# Patient Record
Sex: Male | Born: 1965
Health system: Southern US, Community
[De-identification: ages and names within clinical notes are randomized; demographics above are authoritative.]

## PROBLEM LIST (undated history)

## (undated) DIAGNOSIS — Z9889 Other specified postprocedural states: Secondary | ICD-10-CM

## (undated) DIAGNOSIS — K219 Gastro-esophageal reflux disease without esophagitis: Secondary | ICD-10-CM

## (undated) DIAGNOSIS — I10 Essential (primary) hypertension: Secondary | ICD-10-CM

## (undated) DIAGNOSIS — R112 Nausea with vomiting, unspecified: Secondary | ICD-10-CM

## (undated) DIAGNOSIS — T8859XA Other complications of anesthesia, initial encounter: Secondary | ICD-10-CM

## (undated) DIAGNOSIS — T4145XA Adverse effect of unspecified anesthetic, initial encounter: Secondary | ICD-10-CM

## (undated) DIAGNOSIS — E119 Type 2 diabetes mellitus without complications: Secondary | ICD-10-CM

## (undated) DIAGNOSIS — N189 Chronic kidney disease, unspecified: Secondary | ICD-10-CM

## (undated) DIAGNOSIS — M1990 Unspecified osteoarthritis, unspecified site: Secondary | ICD-10-CM

## (undated) HISTORY — PX: LITHOTRIPSY: SUR834

## (undated) HISTORY — DX: Essential (primary) hypertension: I10

## (undated) HISTORY — DX: Type 2 diabetes mellitus without complications: E11.9

## (undated) HISTORY — PX: KIDNEY STONE SURGERY: SHX686

---

## 2005-03-09 ENCOUNTER — Ambulatory Visit: Payer: Self-pay | Admitting: Specialist

## 2005-05-15 ENCOUNTER — Ambulatory Visit: Payer: Self-pay | Admitting: Physician Assistant

## 2005-06-21 ENCOUNTER — Ambulatory Visit: Payer: Self-pay | Admitting: Unknown Physician Specialty

## 2006-01-18 ENCOUNTER — Ambulatory Visit: Payer: Self-pay | Admitting: Specialist

## 2007-01-19 ENCOUNTER — Ambulatory Visit: Payer: Self-pay | Admitting: Internal Medicine

## 2007-02-06 ENCOUNTER — Ambulatory Visit: Payer: Self-pay | Admitting: Specialist

## 2007-02-24 ENCOUNTER — Ambulatory Visit: Payer: Self-pay | Admitting: Internal Medicine

## 2007-02-25 ENCOUNTER — Ambulatory Visit: Payer: Self-pay | Admitting: Specialist

## 2007-02-28 ENCOUNTER — Ambulatory Visit: Payer: Self-pay | Admitting: Specialist

## 2007-06-08 ENCOUNTER — Ambulatory Visit: Payer: Self-pay | Admitting: Family Medicine

## 2007-06-13 ENCOUNTER — Inpatient Hospital Stay: Payer: Self-pay | Admitting: Urology

## 2007-06-20 ENCOUNTER — Ambulatory Visit: Payer: Self-pay | Admitting: Urology

## 2007-07-07 ENCOUNTER — Emergency Department: Payer: Self-pay | Admitting: Emergency Medicine

## 2007-07-09 ENCOUNTER — Ambulatory Visit: Payer: Self-pay | Admitting: Specialist

## 2007-11-12 ENCOUNTER — Emergency Department: Payer: Self-pay | Admitting: Emergency Medicine

## 2007-11-12 ENCOUNTER — Other Ambulatory Visit: Payer: Self-pay

## 2009-01-04 IMAGING — CR DG HAND COMPLETE 3+V*L*
1 series · 3 of 3 positions shown · non-contrast
Comparison: none

REASON FOR EXAM: laceration
COMMENTS:

PROCEDURE:     MDR - MDR HAND LT COMP W/OBLIQUES  - January 19, 2007 [DATE]
RESULT:     Comparison: No available comparison exam.

[Series 1: view not recorded · 0.17mm/px · 3 of 3 slices shown]
[im 1/3]
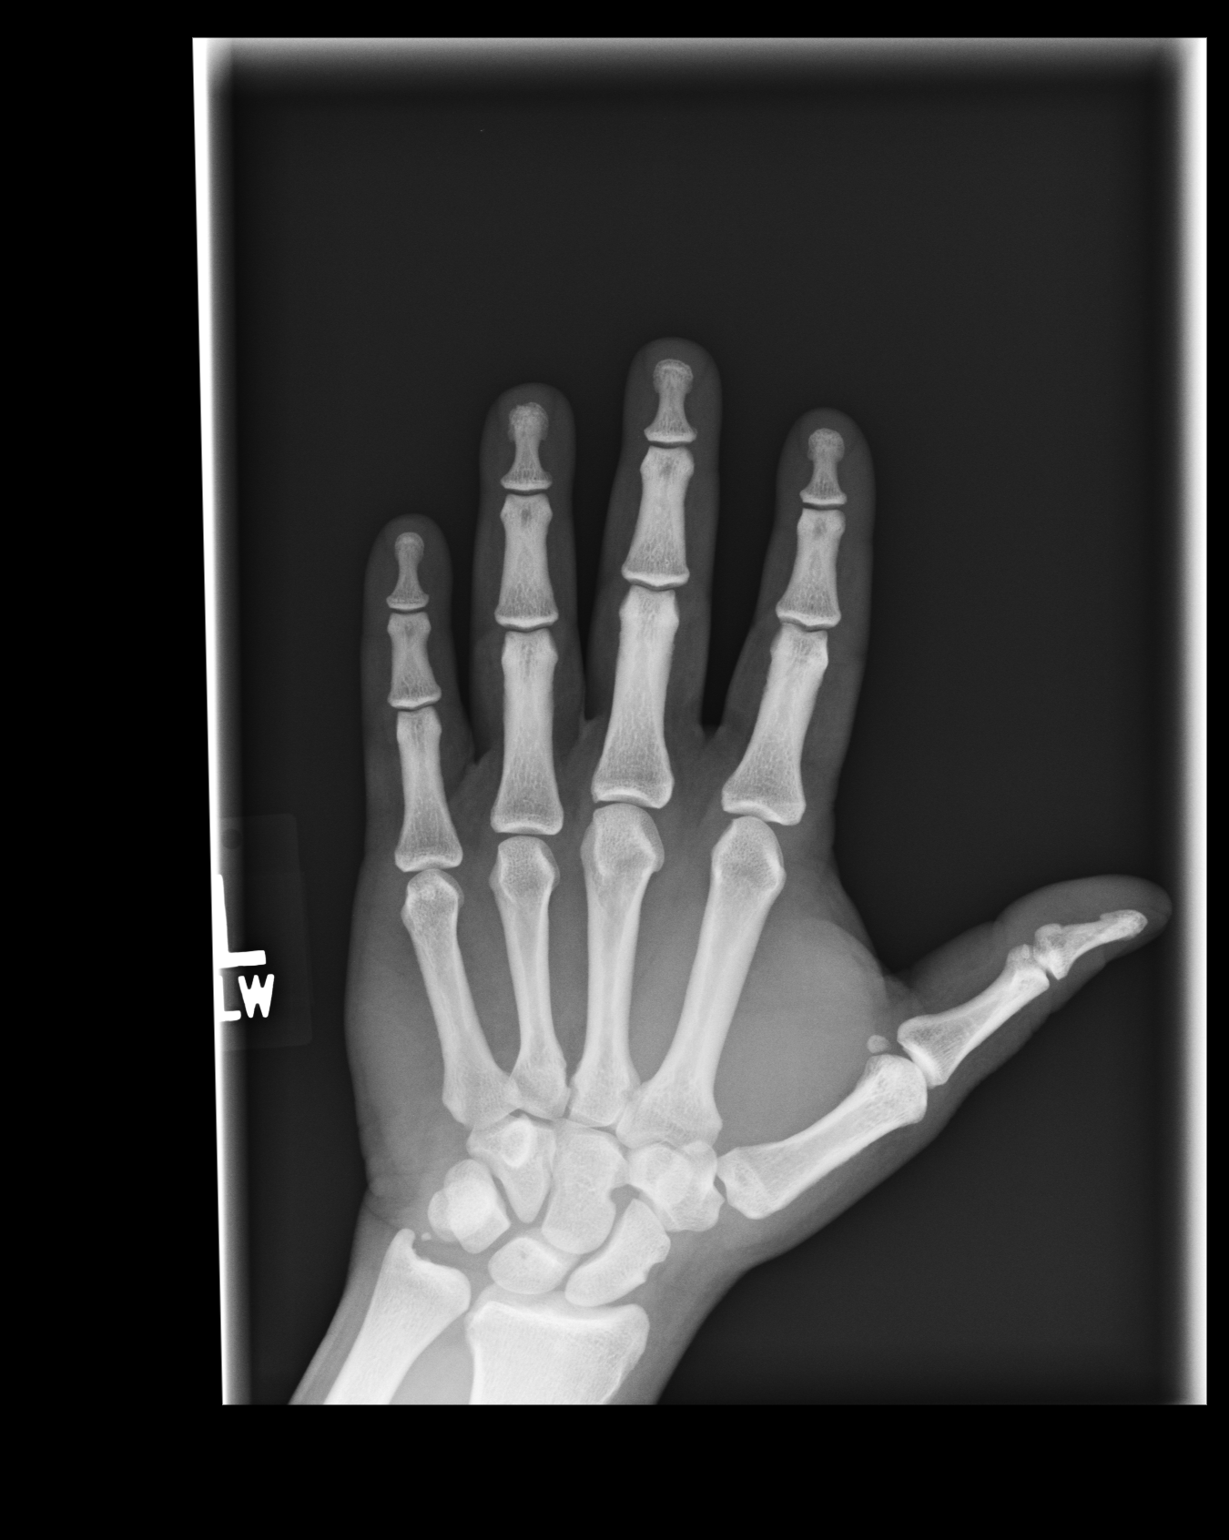
[im 2/3]
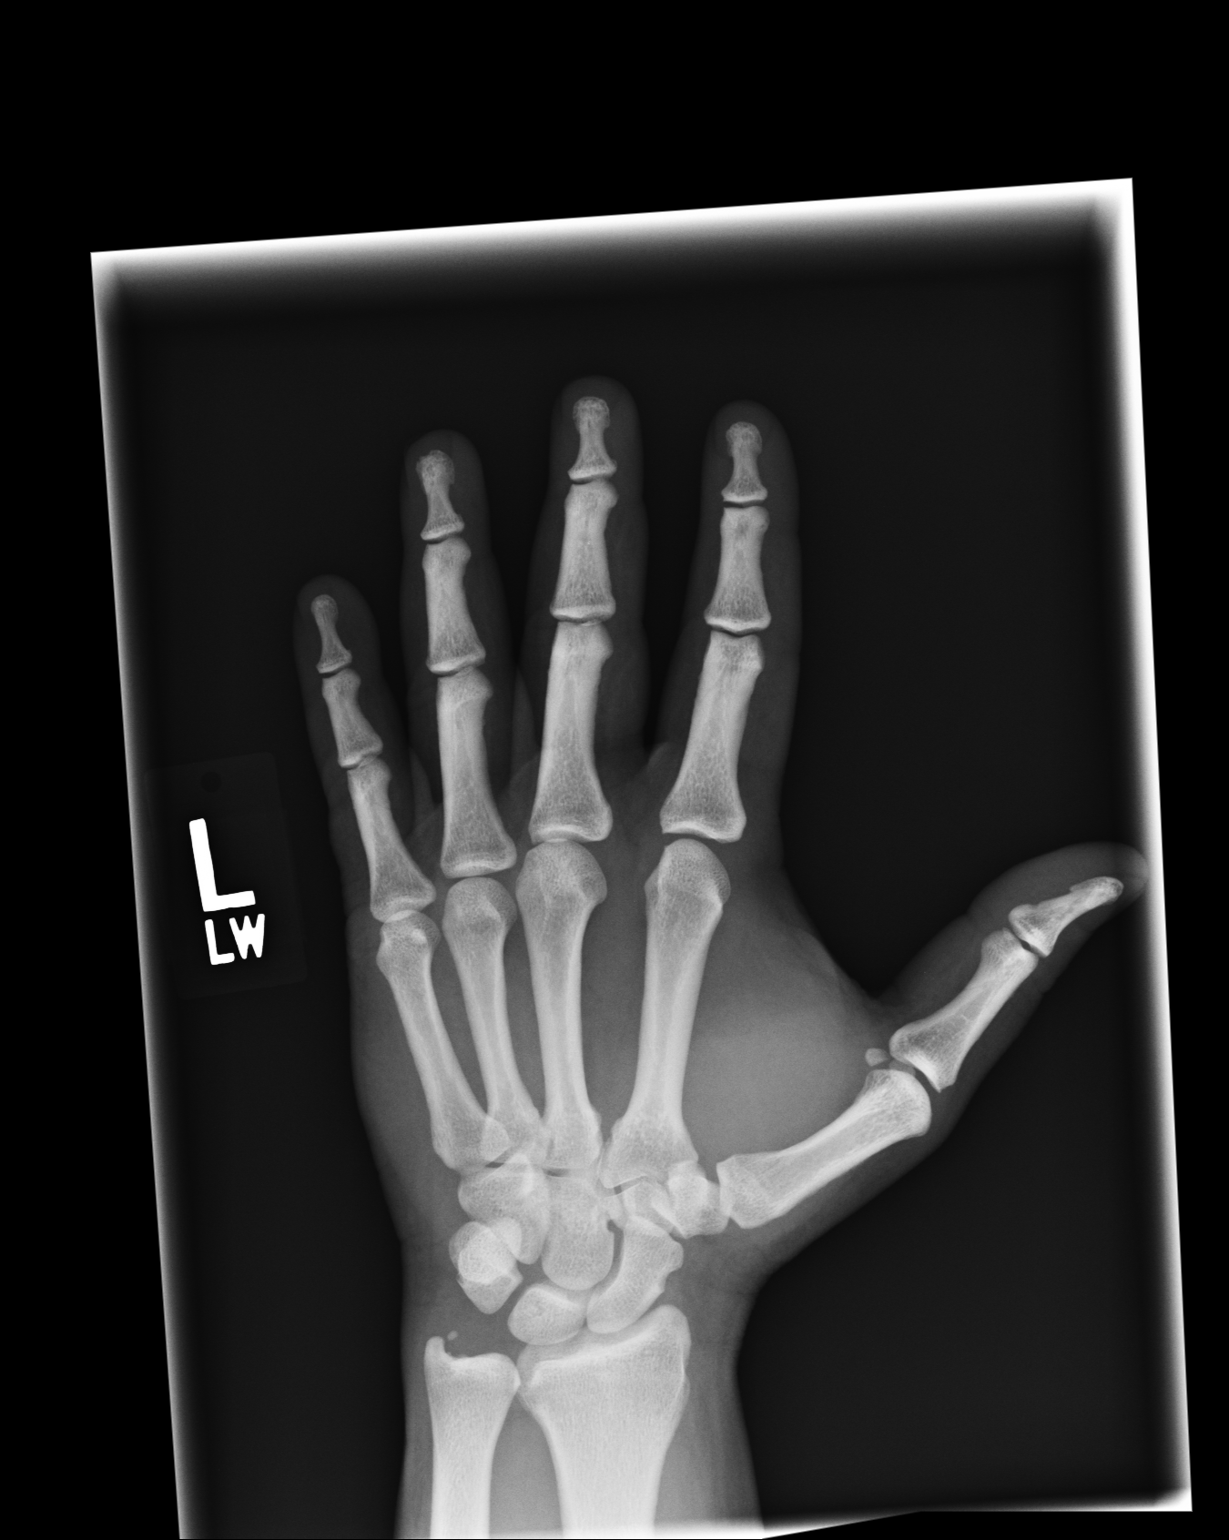
[im 3/3]
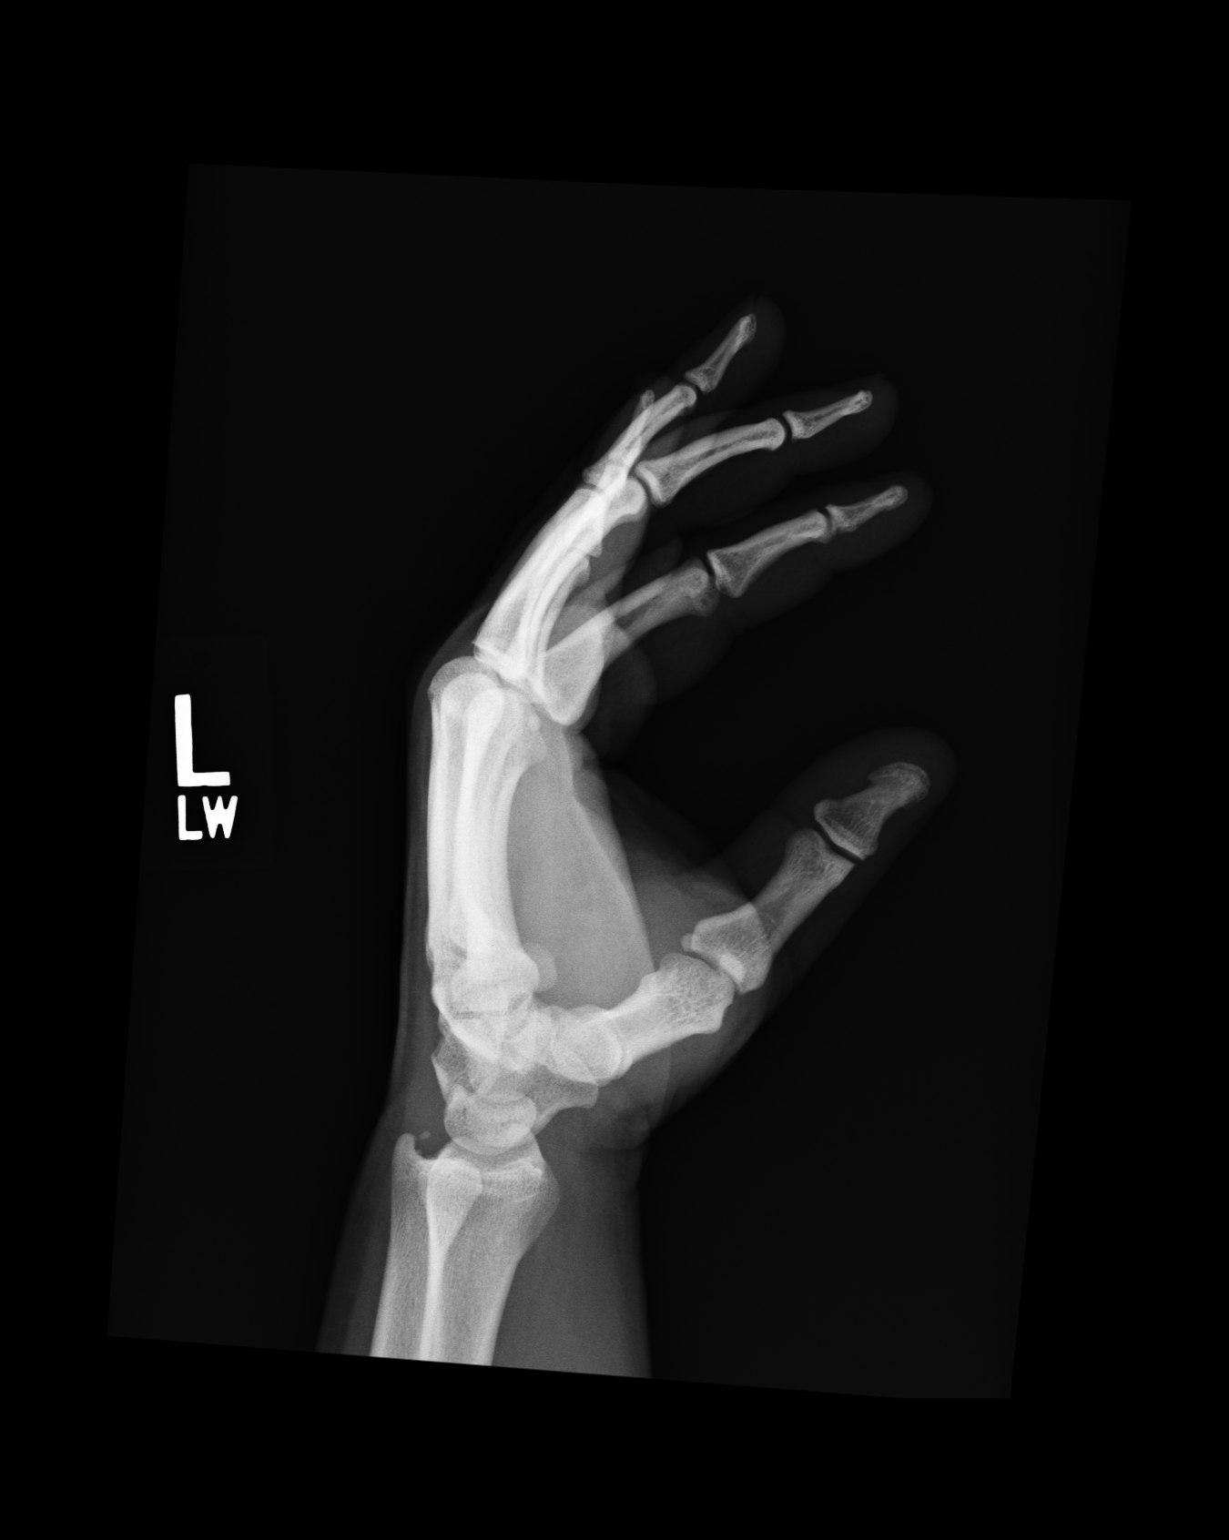

[3 of 3 positions shown; findings below may reference images not displayed]

FINDINGS: Three views of the left hand were obtained.

Old mildly displaced left ulnar styloid fracture is noted. No evidence of
acute fracture or dislocation of the left hand. No significant joint
abnormality is seen. No radiopaque foreign body is seen.
IMPRESSION: 1. Please see findings.

## 2009-05-29 IMAGING — CT CT ABD-PELV W/O CM
1 of 2 series · 15 of 32 positions shown, 19 images · non-contrast
Comparison: none

REASON FOR EXAM: (1) llq pain w/ hx renal colic; (2) llq pain w/ hx renal
colic
COMMENTS:

[Series 2: stone · axial · 0.75mm/px · z∈[+193,+622]mm · 15 of 161 slices shown, 19 images]
[im 12/161  soft-tissue]
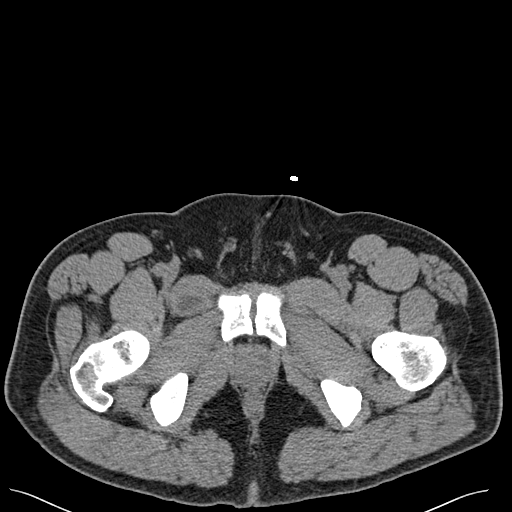
[im 12/161  bone]
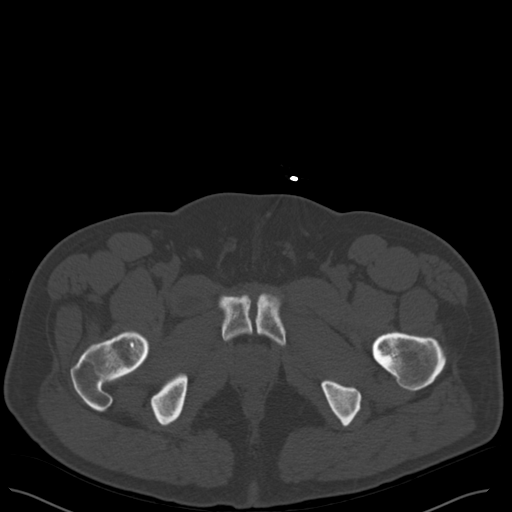
[im 23/161  soft-tissue]
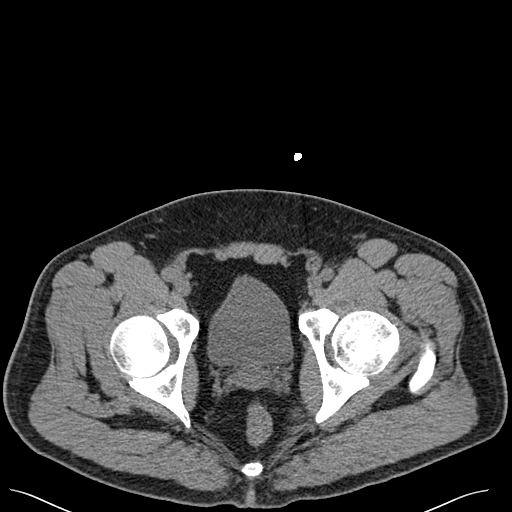
[im 35/161  soft-tissue]
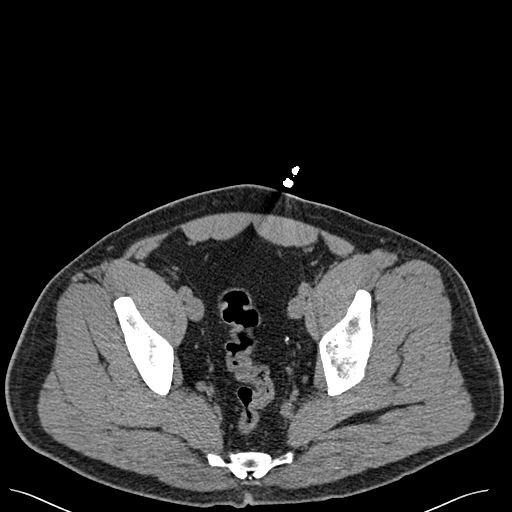
[im 46/161  soft-tissue]
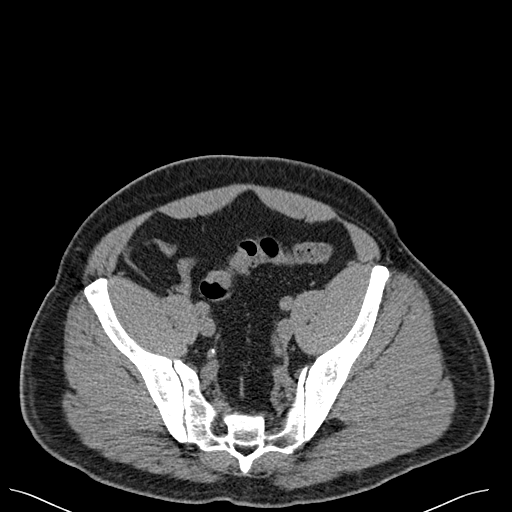
[im 58/161  soft-tissue]
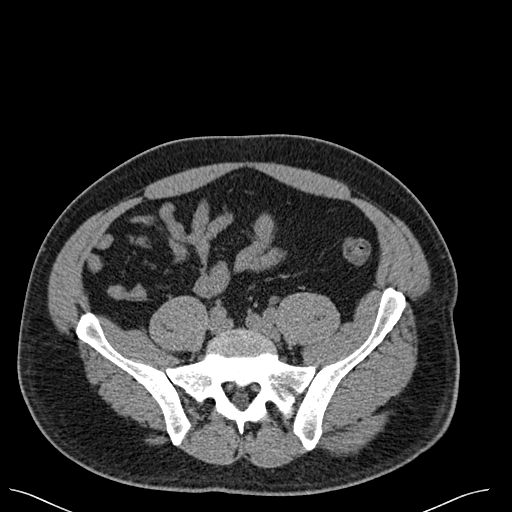
[im 69/161  soft-tissue]
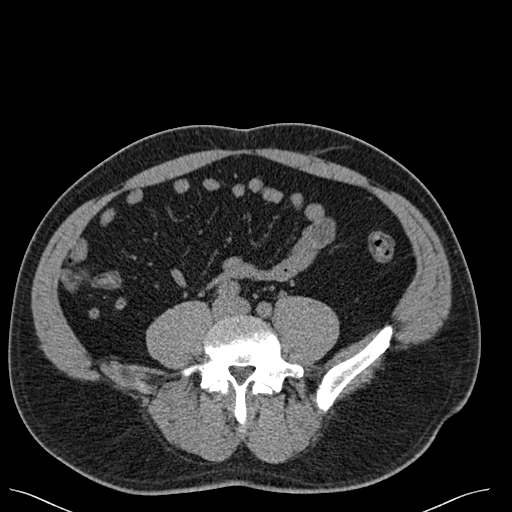
[im 81/161  soft-tissue]
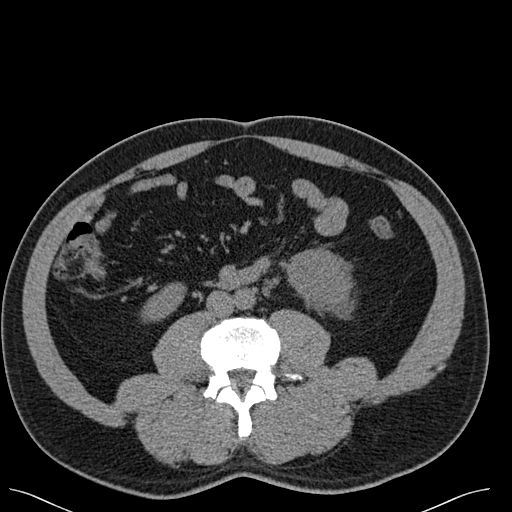
[im 92/161  soft-tissue]
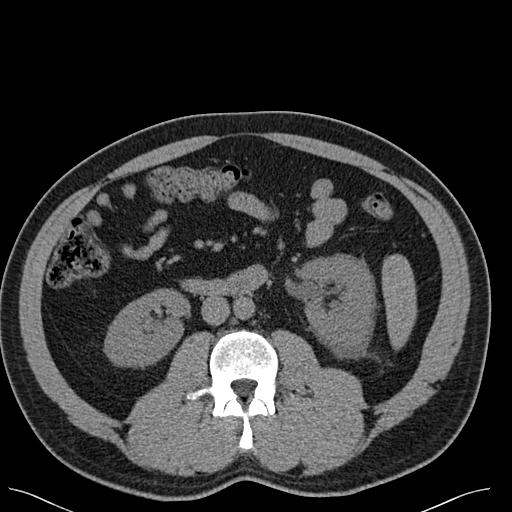
[im 103/161  soft-tissue]
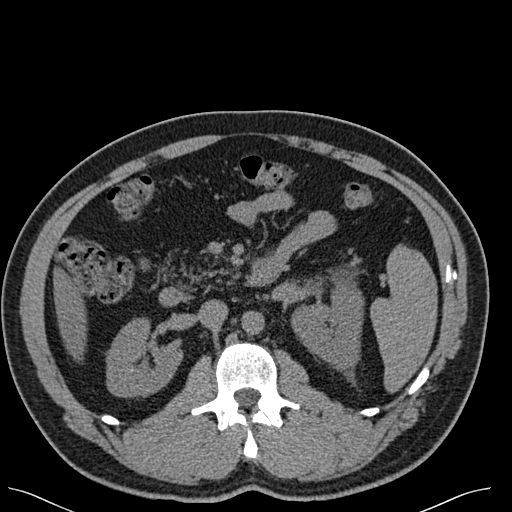
[im 103/161  bone]
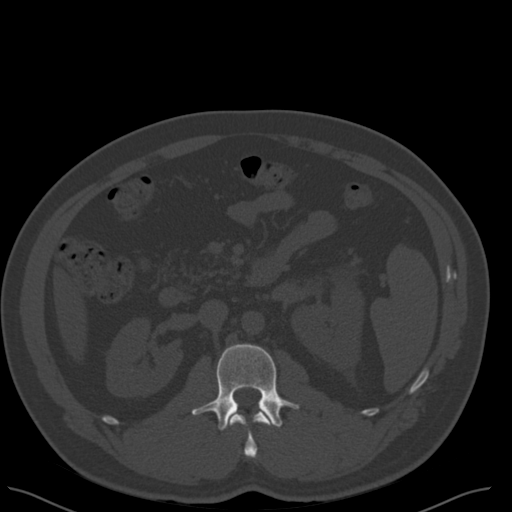
[im 115/161  soft-tissue]
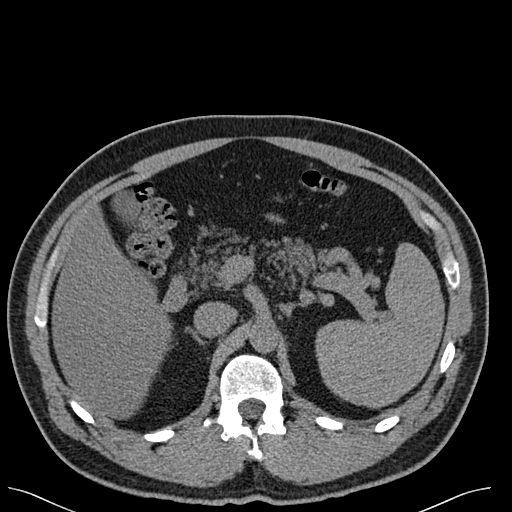
[im 126/161  soft-tissue]
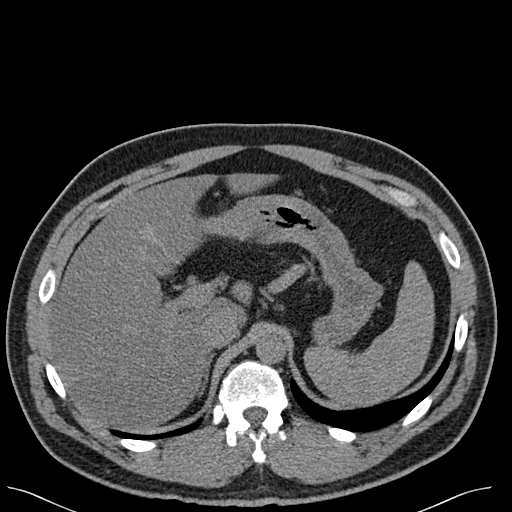
[im 138/161  soft-tissue]
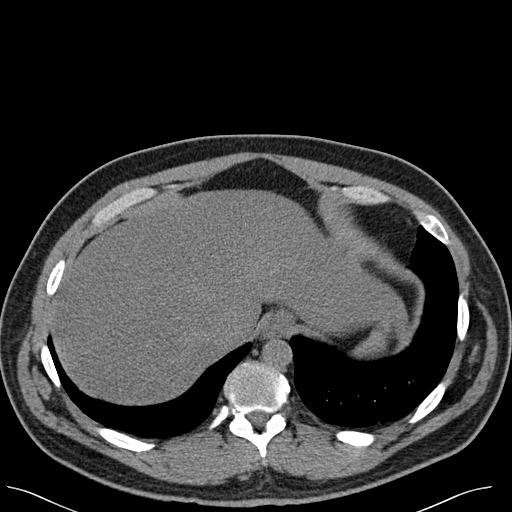
[im 138/161  lung]
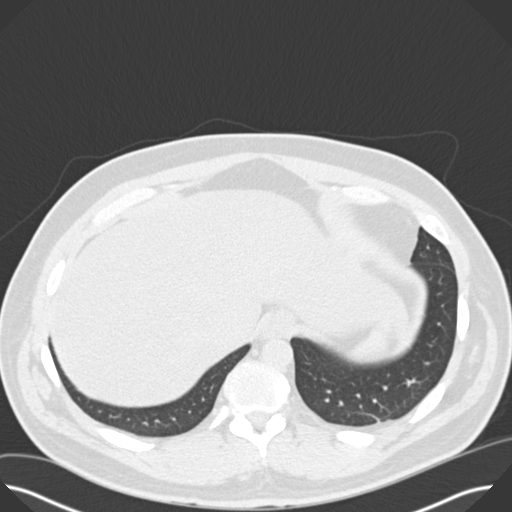
[im 143/161  lung]
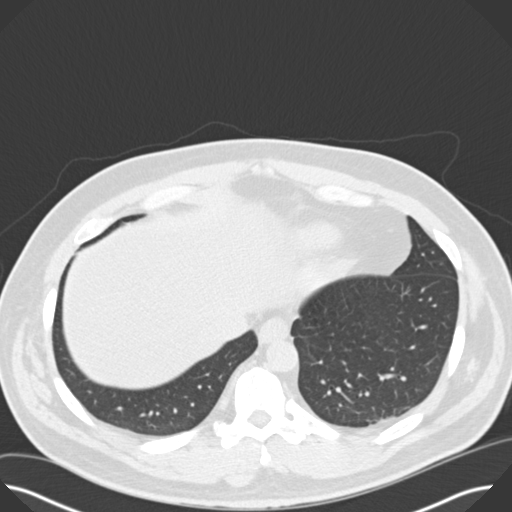
[im 149/161  soft-tissue]
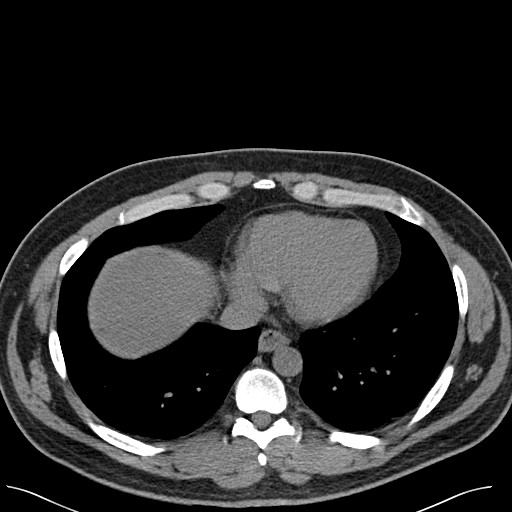
[im 149/161  lung]
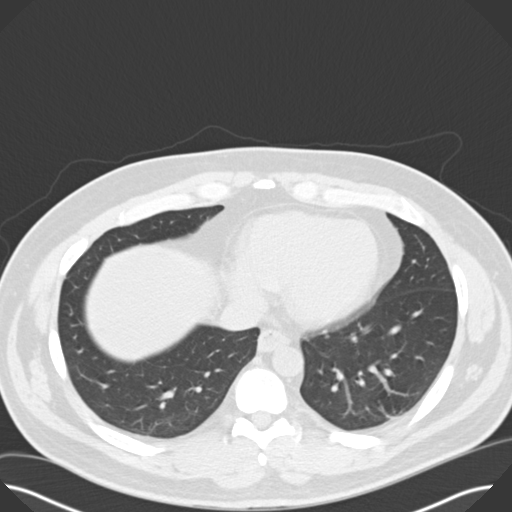
[im 155/161  lung]
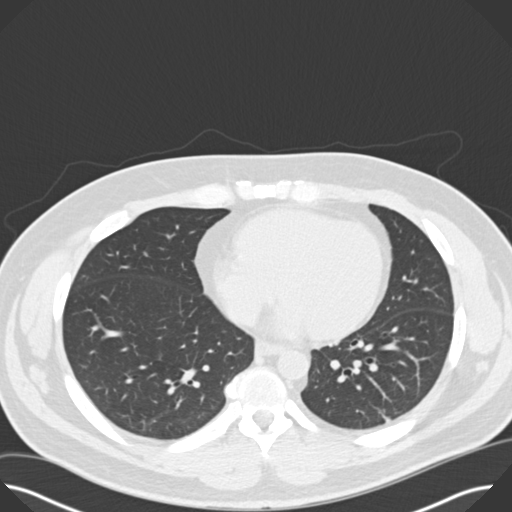

[15 of 32 positions shown; findings below may reference images not displayed]

PROCEDURE:     CT  - CT ABDOMEN AND PELVIS W[DATE]  [DATE]

RESULT:     Comparison is made with prior study dated 02/25/2007.

Helical, noncontrast images in 3 mm sections were obtained from the lung
bases through the pubic symphysis.

Evaluation of the lung bases demonstrate no gross abnormalities.  Within the
limitations of a noncontrast CT  the liver, spleen, pancreas, adrenals, and
RIGHT kidney are unremarkable.

Evaluation of the LEFT kidney demonstrates edema within the renal
parenchyma. There is also stranding in the mesenteric fat. Mild to slightly
moderate dilatation of the renal pelvis is identified and there is mild
hydronephrosis. Mild to slightly moderate hydroureter is identified and
within the distal left ureter a 5.3 mm calculus is identified. This appears
to be approximately 3-5 centimeters proximal to the urinary bladder. There
is no evidence of abdominal or pelvic masses adenopathy no drainable
loculated fluid collections.
IMPRESSION: 1.     Distal left ureteral calculus with associated mild to slightly
moderate obstructive uropathy. Note:  There is stranding within the
perinephric fat which may represent previous decompression of the right
urinary collecting system.
2.     Dr. Giorgi of the emergency department was informed of these
findings at the time of the initial interpretation.  This calculus measures
5.3 mm in diameter.

## 2009-08-17 ENCOUNTER — Ambulatory Visit: Payer: Self-pay | Admitting: General Practice

## 2009-10-13 ENCOUNTER — Emergency Department: Payer: Self-pay | Admitting: Emergency Medicine

## 2010-09-12 ENCOUNTER — Ambulatory Visit: Payer: Self-pay | Admitting: Internal Medicine

## 2011-05-06 ENCOUNTER — Ambulatory Visit: Payer: Self-pay | Admitting: Internal Medicine

## 2011-09-10 ENCOUNTER — Ambulatory Visit: Payer: Self-pay | Admitting: Family Medicine

## 2011-09-10 LAB — RAPID STREP-A WITH REFLX: Micro Text Report: NEGATIVE

## 2012-07-19 ENCOUNTER — Ambulatory Visit: Payer: Self-pay | Admitting: Family Medicine

## 2012-09-26 ENCOUNTER — Emergency Department: Payer: Self-pay | Admitting: Emergency Medicine

## 2012-09-26 LAB — BASIC METABOLIC PANEL
Chloride: 105 mmol/L (ref 98–107)
Co2: 25 mmol/L (ref 21–32)
EGFR (Non-African Amer.): >60
Osmolality: 279 (ref 275–301)
Potassium: 4.8 mmol/L (ref 3.5–5.1)
Sodium: 136 mmol/L (ref 136–145)

## 2012-09-26 LAB — CBC
HCT: 41.5 % (ref 40.0–52.0)
HGB: 14.9 g/dL (ref 13.0–18.0)
MCHC: 35.8 g/dL (ref 32.0–36.0)
MCV: 92 fL (ref 80–100)
RDW: 13.1 % (ref 11.5–14.5)

## 2012-09-26 LAB — URINALYSIS, COMPLETE
Leukocyte Esterase: NEGATIVE
Nitrite: NEGATIVE
Ph: 5 (ref 4.5–8.0)
Specific Gravity: 1.02 (ref 1.003–1.030)

## 2013-02-21 ENCOUNTER — Ambulatory Visit: Payer: Self-pay | Admitting: Family Medicine

## 2013-02-21 LAB — CBC WITH DIFFERENTIAL/PLATELET
Basophil %: 1.1 %
Eosinophil #: 0.2 10*3/uL (ref 0.0–0.7)
Eosinophil %: 3.4 %
HGB: 16.5 g/dL (ref 13.0–18.0)
Lymphocyte #: 2 10*3/uL (ref 1.0–3.6)
Monocyte #: 0.5 x10 3/mm (ref 0.2–1.0)
Monocyte %: 7.8 %
Neutrophil #: 3.3 10*3/uL (ref 1.4–6.5)
Neutrophil %: 54.6 %
Platelet: 196 10*3/uL (ref 150–440)
RDW: 12.6 % (ref 11.5–14.5)
WBC: 6 10*3/uL (ref 3.8–10.6)

## 2013-02-26 ENCOUNTER — Ambulatory Visit: Payer: Self-pay | Admitting: Gastroenterology

## 2013-02-26 LAB — COMPREHENSIVE METABOLIC PANEL
Anion Gap: 8 (ref 7–16)
Bilirubin,Total: 0.8 mg/dL (ref 0.2–1.0)
Calcium, Total: 9.2 mg/dL (ref 8.5–10.1)
Chloride: 100 mmol/L (ref 98–107)
Co2: 23 mmol/L (ref 21–32)
Creatinine: 1.06 mg/dL (ref 0.60–1.30)
EGFR (Non-African Amer.): >60
Glucose: 189 mg/dL — ABNORMAL HIGH (ref 65–99)
SGPT (ALT): 119 U/L — ABNORMAL HIGH (ref 12–78)

## 2013-02-26 LAB — CBC WITH DIFFERENTIAL/PLATELET
Basophil #: 0.1 10*3/uL (ref 0.0–0.1)
Eosinophil %: 1.9 %
HCT: 45.7 % (ref 40.0–52.0)
HGB: 16.6 g/dL (ref 13.0–18.0)
Lymphocyte %: 24.4 %
MCH: 33.5 pg (ref 26.0–34.0)
MCHC: 36.3 g/dL — ABNORMAL HIGH (ref 32.0–36.0)
Monocyte #: 0.8 x10 3/mm (ref 0.2–1.0)
Monocyte %: 7.9 %
Neutrophil #: 6.4 10*3/uL (ref 1.4–6.5)
Platelet: 263 10*3/uL (ref 150–440)
WBC: 9.9 10*3/uL (ref 3.8–10.6)

## 2013-02-26 LAB — SEDIMENTATION RATE: Erythrocyte Sed Rate: 1 mm/hr (ref 0–15)

## 2013-05-15 ENCOUNTER — Ambulatory Visit: Payer: Self-pay | Admitting: Internal Medicine

## 2013-11-10 ENCOUNTER — Emergency Department: Payer: Self-pay | Admitting: Emergency Medicine

## 2013-11-10 LAB — BASIC METABOLIC PANEL
ANION GAP: 6 — AB (ref 7–16)
BUN: 10 mg/dL (ref 7–18)
CHLORIDE: 102 mmol/L (ref 98–107)
CREATININE: 0.79 mg/dL (ref 0.60–1.30)
Calcium, Total: 9.4 mg/dL (ref 8.5–10.1)
Co2: 27 mmol/L (ref 21–32)
EGFR (African American): >60
EGFR (Non-African Amer.): >60
GLUCOSE: 152 mg/dL — AB (ref 65–99)
OSMOLALITY: 272 (ref 275–301)
POTASSIUM: 3.8 mmol/L (ref 3.5–5.1)
Sodium: 135 mmol/L — ABNORMAL LOW (ref 136–145)

## 2013-11-10 LAB — CBC
HCT: 44.1 % (ref 40.0–52.0)
HGB: 15.6 g/dL (ref 13.0–18.0)
MCH: 33.5 pg (ref 26.0–34.0)
MCHC: 35.5 g/dL (ref 32.0–36.0)
MCV: 94 fL (ref 80–100)
PLATELETS: 214 10*3/uL (ref 150–440)
RBC: 4.67 10*6/uL (ref 4.40–5.90)
RDW: 12.3 % (ref 11.5–14.5)
WBC: 6.5 10*3/uL (ref 3.8–10.6)

## 2013-11-10 LAB — TROPONIN I: Troponin-I: <0.02 ng/mL

## 2014-09-12 IMAGING — US US RENAL KIDNEY
1 series · 14 of 25 positions shown · non-contrast
Comparison: none

REASON FOR EXAM: left flank pain
COMMENTS:

[Series 1: us renal kidney · 0.30mm/px · 14 of 27 slices shown]
[im 1/27]
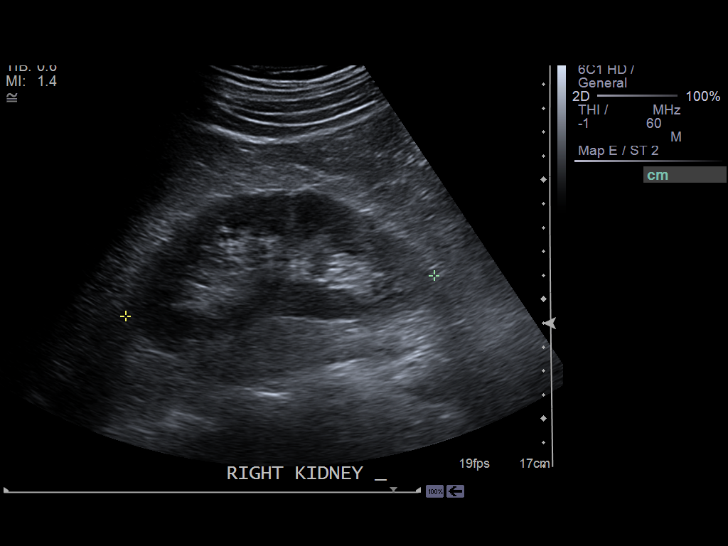
[im 3/27]
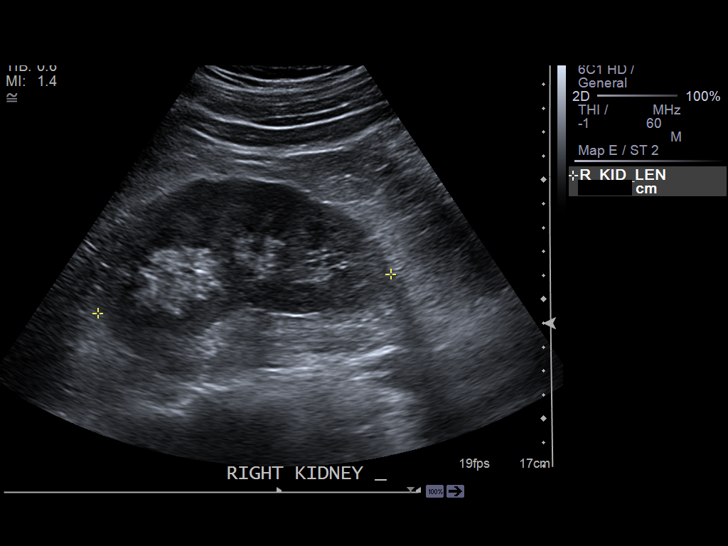
[im 5/27]
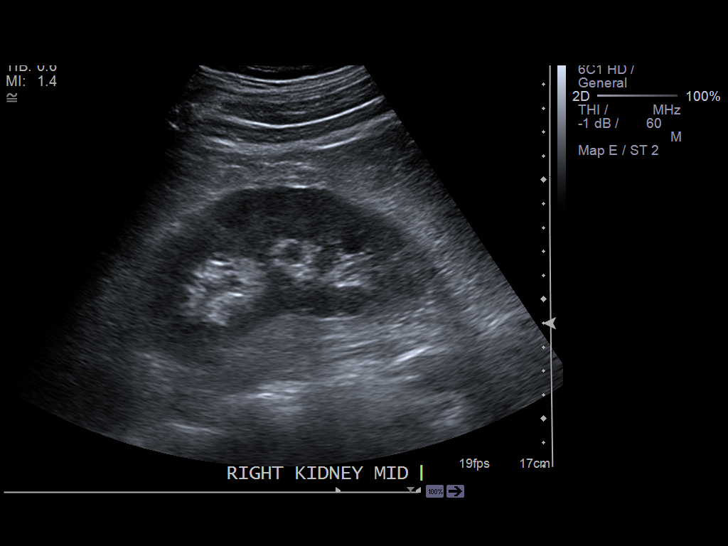
[im 7/27]
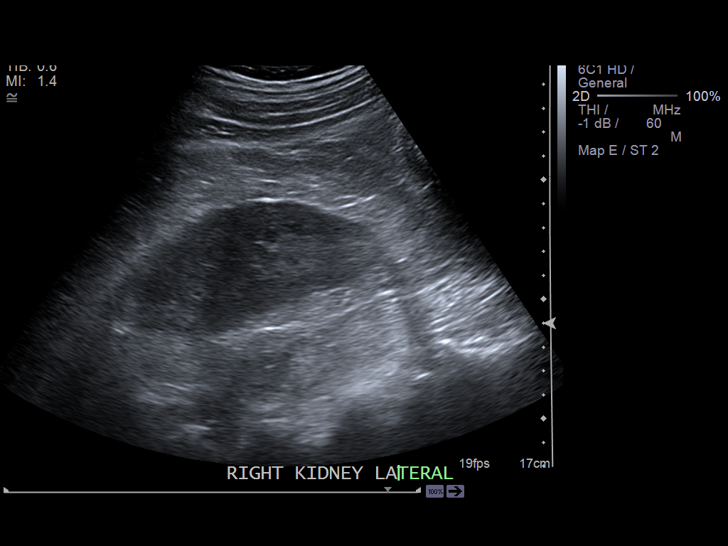
[im 9/27]
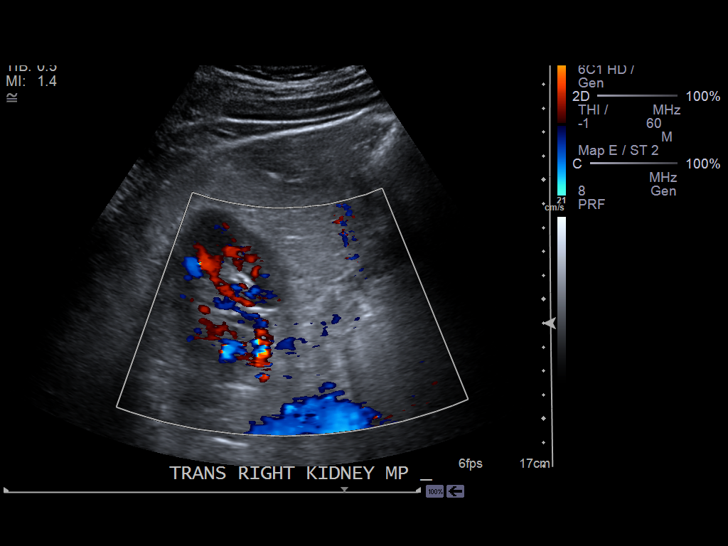
[im 10/27]
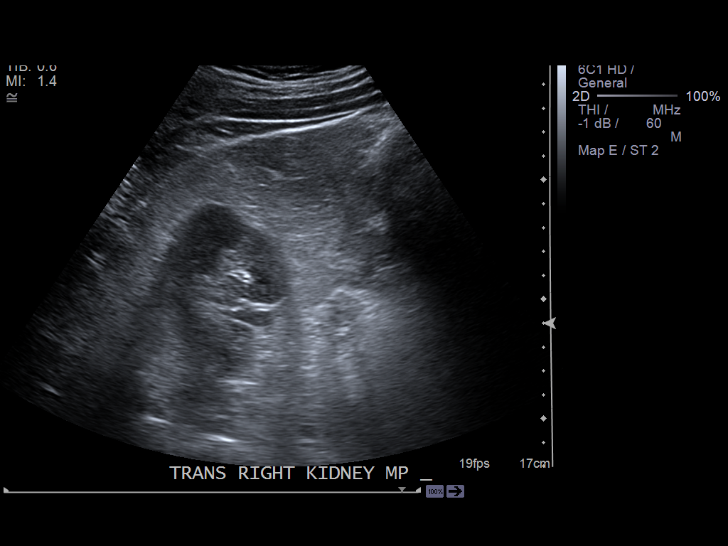
[im 12/27]
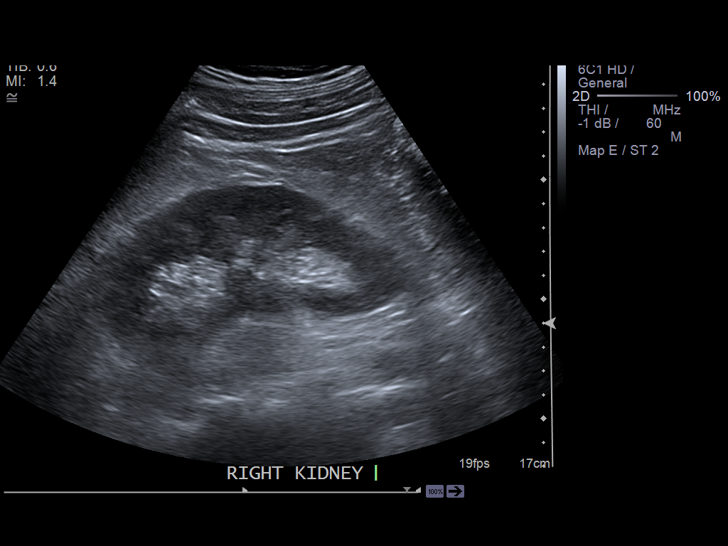
[im 15/27]
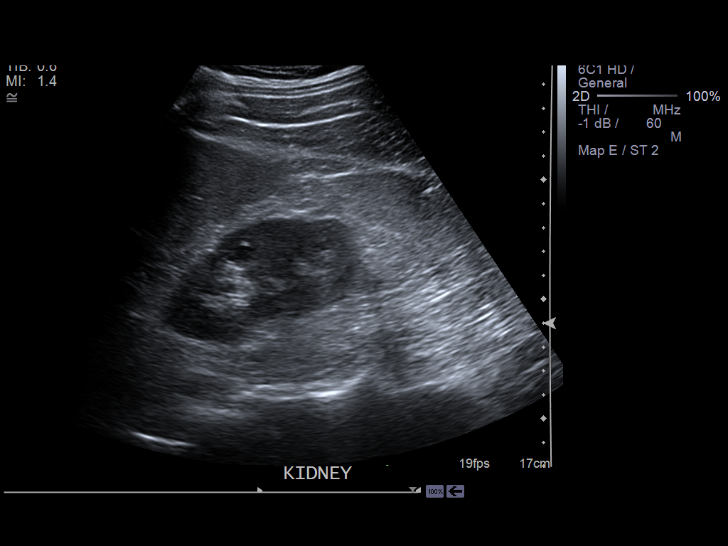
[im 17/27]
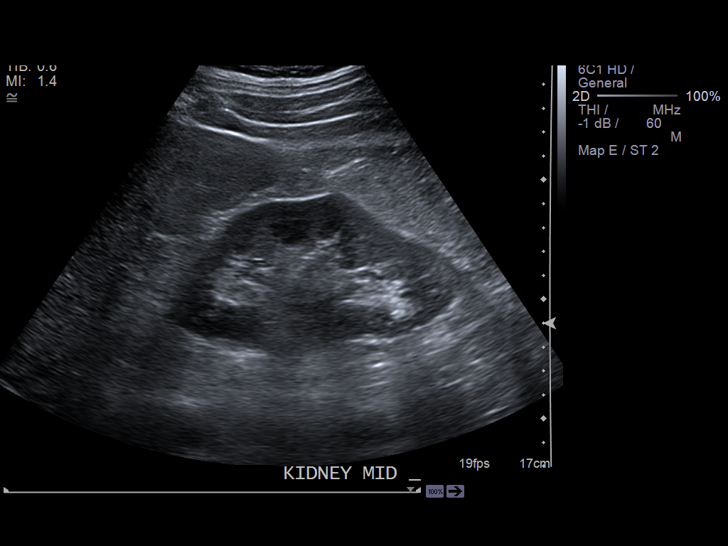
[im 18/27]
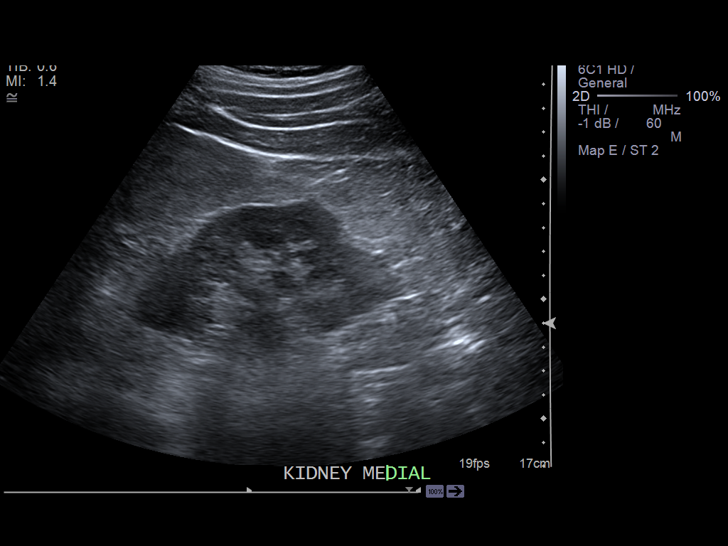
[im 20/27]
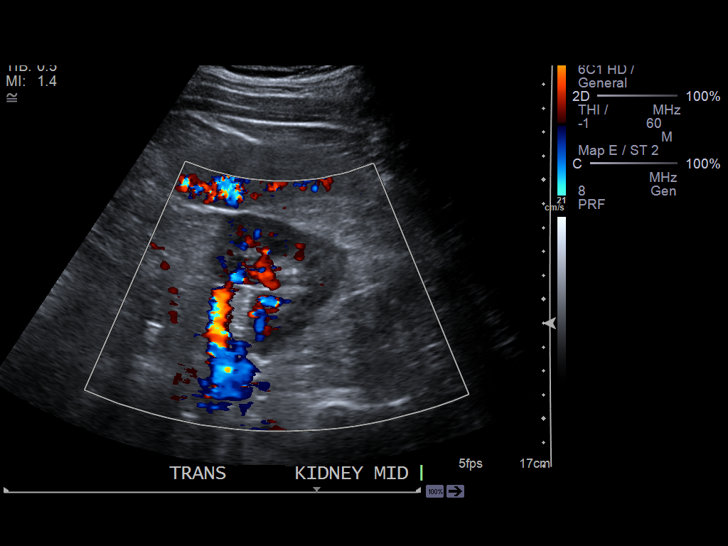
[im 22/27]
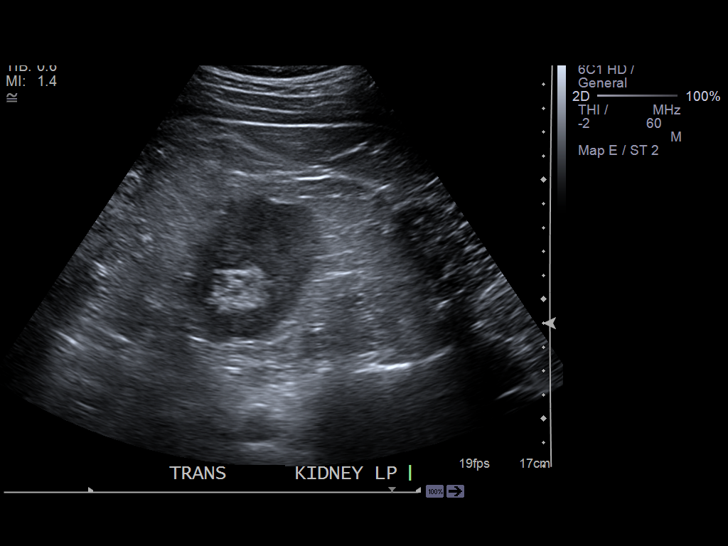
[im 24/27]
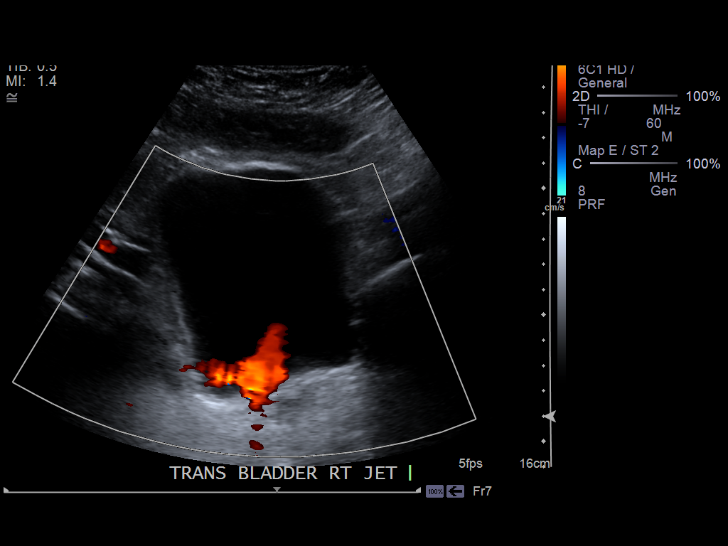
[im 27/27]
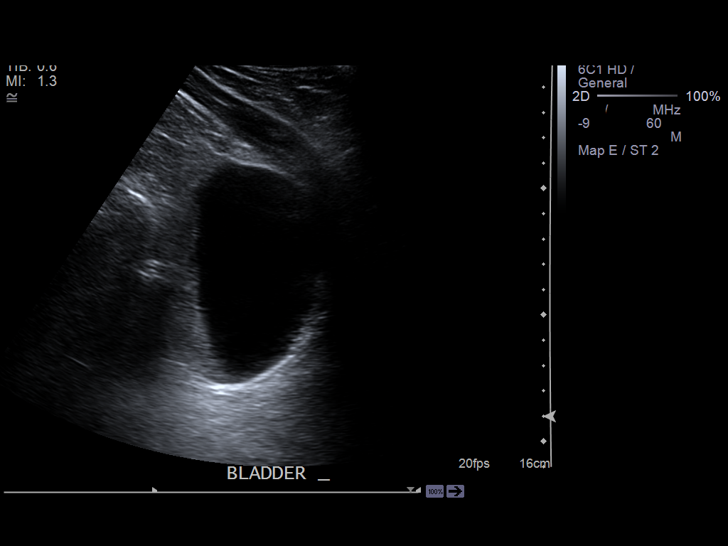

[14 of 25 positions shown; findings below may reference images not displayed]

PROCEDURE:     US  - US KIDNEY  - September 26, 2012  [DATE]

RESULT:     Renal sonogram demonstrates the kidneys show normal echotexture.
Ureteral jets are seen bilaterally with color Doppler imaging in the bladder
region. The kidneys appear to be grossly normal in size and echotexture
without obstructive change, mass or stone.

The right kidney measures 12.38 x 6.23 x 6.37 cm. The left kidney measures
12.73 x 5.94 x 4.82 cm.
IMPRESSION: 1. Normal-appearing renal sonogram.

[REDACTED]

## 2015-02-09 ENCOUNTER — Other Ambulatory Visit: Payer: Self-pay

## 2015-02-24 ENCOUNTER — Other Ambulatory Visit: Payer: Self-pay | Admitting: Family Medicine

## 2015-03-29 ENCOUNTER — Other Ambulatory Visit: Payer: Self-pay | Admitting: Family Medicine

## 2015-04-15 ENCOUNTER — Other Ambulatory Visit: Payer: Self-pay | Admitting: Family Medicine

## 2015-04-21 ENCOUNTER — Ambulatory Visit (INDEPENDENT_AMBULATORY_CARE_PROVIDER_SITE_OTHER): Payer: BLUE CROSS/BLUE SHIELD | Admitting: Family Medicine

## 2015-04-21 ENCOUNTER — Encounter: Payer: Self-pay | Admitting: Family Medicine

## 2015-04-21 VITALS — BP 120/80 | HR 70 | Ht 66.0 in | Wt 197.0 lb

## 2015-04-21 DIAGNOSIS — E785 Hyperlipidemia, unspecified: Secondary | ICD-10-CM | POA: Insufficient documentation

## 2015-04-21 DIAGNOSIS — E119 Type 2 diabetes mellitus without complications: Secondary | ICD-10-CM

## 2015-04-21 DIAGNOSIS — Z1211 Encounter for screening for malignant neoplasm of colon: Secondary | ICD-10-CM

## 2015-04-21 DIAGNOSIS — Z23 Encounter for immunization: Secondary | ICD-10-CM | POA: Diagnosis not present

## 2015-04-21 DIAGNOSIS — I10 Essential (primary) hypertension: Secondary | ICD-10-CM

## 2015-04-21 LAB — HEMOCCULT GUIAC POC 1CARD (OFFICE): Fecal Occult Blood, POC: NEGATIVE

## 2015-04-21 MED ORDER — GLIPIZIDE-METFORMIN HCL 5-500 MG PO TABS: 1.0000 | ORAL_TABLET | Freq: Two times a day (BID) | ORAL | Status: DC

## 2015-04-21 MED ORDER — LISINOPRIL 5 MG PO TABS: 5.0000 mg | ORAL_TABLET | Freq: Every day | ORAL | Status: DC

## 2015-04-21 MED ORDER — ASPIRIN 81 MG PO TABS: 81.0000 mg | ORAL_TABLET | Freq: Every day | ORAL | Status: DC

## 2015-04-21 NOTE — Progress Notes (Signed)
Name: Kevin Montgomery   MRN: [id number]    DOB: July 22, 1965   Date:04/21/2015       Progress Note  Subjective  Chief Complaint  Chief Complaint  Patient presents with  . Diabetes  . Hypertension    Diabetes He presents for his follow-up diabetic visit. He has type 2 diabetes mellitus. His disease course has been stable. Pertinent negatives for hypoglycemia include no confusion, dizziness, headaches, hunger, mood changes, nervousness/anxiousness, pallor, seizures, sleepiness, speech difficulty, sweats or tremors. Pertinent negatives for diabetes include no blurred vision, no chest pain, no fatigue, no foot paresthesias, no foot ulcerations, no polydipsia, no polyphagia, no polyuria, no visual change, no weakness and no weight loss. (Nocturia) There are no hypoglycemic complications. Symptoms are stable. There are no diabetic complications. Pertinent negatives for diabetic complications include no CVA, PVD or retinopathy. Current diabetic treatment includes oral agent (monotherapy). There is no change in his home blood glucose trend. An ACE inhibitor/angiotensin II receptor blocker is being taken. He does not see a podiatrist.Eye exam is not current.  Hypertension This is a chronic problem. The current episode started more than 1 year ago. The problem has been waxing and waning since onset. The problem is controlled. Pertinent negatives include no blurred vision, chest pain, headaches, malaise/fatigue, neck pain, palpitations, shortness of breath or sweats. There are no associated agents to hypertension. There are no known risk factors for coronary artery disease. Past treatments include nothing. The current treatment provides mild improvement. There are no compliance problems.  There is no history of angina, kidney disease, CAD/MI, CVA, heart failure, left ventricular hypertrophy, PVD, renovascular disease or retinopathy. There is no history of chronic renal disease or a hypertension causing med.   Hyperlipidemia This is a chronic problem. The current episode started more than 1 year ago. He has no history of chronic renal disease. Pertinent negatives include no chest pain, focal weakness, myalgias or shortness of breath. Current antihyperlipidemic treatment includes diet change. There are no compliance problems.     No problem-specific assessment & plan notes found for this encounter.   Past Medical History  Diagnosis Date  . Hypertension   . Diabetes mellitus without complication [facility])     Past Surgical History  Procedure Laterality Date  . Lithotripsy      Family History  Problem Relation Age of Onset  . Hyperlipidemia Mother   . Cancer Father   . Cancer Paternal Uncle   . Cancer Paternal Grandmother     Social History   Social History  . Marital Status: Married    Spouse Name: N/A  . Number of Children: N/A  . Years of Education: N/A   Occupational History  . Not on file.   Social History Main Topics  . Smoking status: Never Smoker   . Smokeless tobacco: Not on file  . Alcohol Use: 0.0 oz/week    0 Standard drinks or equivalent per week  . Drug Use: No  . Sexual Activity: Yes   Other Topics Concern  . Not on file   Social History Narrative  . No narrative on file    No Known Allergies   Review of Systems  Constitutional: Negative for fever, chills, weight loss, malaise/fatigue and fatigue.  HENT: Negative for ear discharge, ear pain and sore throat.   Eyes: Negative for blurred vision.  Respiratory: Negative for cough, sputum production, shortness of breath and wheezing.   Cardiovascular: Negative for chest pain, palpitations and leg swelling.  Gastrointestinal: Negative for  heartburn, nausea, abdominal pain, diarrhea, constipation, blood in stool and melena.  Genitourinary: Negative for dysuria, urgency, frequency and hematuria.  Musculoskeletal: Negative for myalgias, back pain, joint pain and neck pain.  Skin: Negative for pallor and  rash.  Neurological: Negative for dizziness, tingling, tremors, sensory change, focal weakness, seizures, speech difficulty, weakness and headaches.  Endo/Heme/Allergies: Negative for environmental allergies, polydipsia and polyphagia. Does not bruise/bleed easily.  Psychiatric/Behavioral: Negative for depression, suicidal ideas and confusion. The patient is not nervous/anxious and does not have insomnia.      Objective  Filed Vitals:   04/21/15 0803  BP: 120/80  Pulse: 70  Height: 5\' 6"  (1.676 m)  Weight: 197 lb (89.359 kg)    Physical Exam  Constitutional: He is oriented to person, place, and time and well-developed, well-nourished, and in no distress.  HENT:  Head: Normocephalic.  Right Ear: External ear normal.  Left Ear: External ear normal.  Nose: Nose normal.  Mouth/Throat: Oropharynx is clear and moist.  Eyes: Conjunctivae and EOM are normal. Pupils are equal, round, and reactive to light. Right eye exhibits no discharge. Left eye exhibits no discharge. No scleral icterus.  Neck: Normal range of motion. Neck supple. No JVD present. No tracheal deviation present. No thyromegaly present.  Cardiovascular: Normal rate, regular rhythm, normal heart sounds and intact distal pulses.  Exam reveals no gallop and no friction rub.   No murmur heard. Pulmonary/Chest: Breath sounds normal. No respiratory distress. He has no wheezes. He has no rales.  Abdominal: Soft. Bowel sounds are normal. He exhibits no mass. There is no hepatosplenomegaly. There is no tenderness. There is no rebound, no guarding and no CVA tenderness.  Genitourinary: Rectum normal and prostate normal.  Musculoskeletal: Normal range of motion. He exhibits no edema or tenderness.  Lymphadenopathy:    He has no cervical adenopathy.  Neurological: He is alert and oriented to person, place, and time. He has normal sensation, normal strength, normal reflexes and intact cranial nerves. No cranial nerve deficit.  Skin:  Skin is warm. No rash noted.  Psychiatric: Mood and affect normal.      Assessment & Plan  Problem List Items Addressed This Visit      Cardiovascular and Mediastinum   Essential hypertension - Primary   Relevant Medications   aspirin 81 MG tablet   lisinopril (PRINIVIL,ZESTRIL) 5 MG tablet   Other Relevant Orders   Renal Function Panel     Endocrine   Type 2 diabetes mellitus without complication, without long-term current use of insulin (HCC)   Relevant Medications   aspirin 81 MG tablet   lisinopril (PRINIVIL,ZESTRIL) 5 MG tablet   glipiZIDE-metformin (METAGLIP) 5-500 MG tablet   Other Relevant Orders   HgB A1c     Other   Hyperlipidemia   Relevant Medications   aspirin 81 MG tablet   lisinopril (PRINIVIL,ZESTRIL) 5 MG tablet   Other Relevant Orders   Lipid Profile    Other Visit Diagnoses    Colon cancer screening        Relevant Orders    POCT Occult Blood Stool (Completed)    Need for influenza vaccination        Relevant Orders    Flu Vaccine QUAD 36+ mos PF IM (Fluarix & Fluzone Quad PF) (Completed)         Dr. Deanna Jones [facility]  04/21/2015

## 2015-04-22 LAB — LIPID PANEL
CHOL/HDL RATIO: 4.3 ratio (ref 0.0–5.0)
CHOLESTEROL TOTAL: 165 mg/dL (ref 100–199)
HDL: 38 mg/dL — AB (ref >39)
LDL Calculated: 104 mg/dL — ABNORMAL HIGH (ref 0–99)
TRIGLYCERIDES: 114 mg/dL (ref 0–149)
VLDL Cholesterol Cal: 23 mg/dL (ref 5–40)

## 2015-04-22 LAB — HEMOGLOBIN A1C
Est. average glucose Bld gHb Est-mCnc: 174 mg/dL
Hgb A1c MFr Bld: 7.7 % — ABNORMAL HIGH (ref 4.8–5.6)

## 2015-04-22 LAB — RENAL FUNCTION PANEL
ALBUMIN: 5.1 g/dL (ref 3.5–5.5)
BUN/Creatinine Ratio: 10 (ref 9–20)
BUN: 9 mg/dL (ref 6–24)
CO2: 23 mmol/L (ref 18–29)
Calcium: 10.1 mg/dL (ref 8.7–10.2)
Chloride: 96 mmol/L — ABNORMAL LOW (ref 97–106)
Creatinine, Ser: 0.93 mg/dL (ref 0.76–1.27)
GFR calc Af Amer: 111 mL/min/{1.73_m2} (ref >59)
GFR, EST NON AFRICAN AMERICAN: 96 mL/min/{1.73_m2} (ref >59)
GLUCOSE: 169 mg/dL — AB (ref 65–99)
PHOSPHORUS: 2.7 mg/dL (ref 2.5–4.5)
POTASSIUM: 4.1 mmol/L (ref 3.5–5.2)
Sodium: 139 mmol/L (ref 136–144)

## 2015-04-29 ENCOUNTER — Other Ambulatory Visit: Payer: Self-pay

## 2015-05-03 ENCOUNTER — Other Ambulatory Visit: Payer: Self-pay

## 2015-06-24 ENCOUNTER — Other Ambulatory Visit: Payer: BLUE CROSS/BLUE SHIELD

## 2015-06-24 DIAGNOSIS — E119 Type 2 diabetes mellitus without complications: Secondary | ICD-10-CM

## 2015-06-25 LAB — HEMOGLOBIN A1C
Est. average glucose Bld gHb Est-mCnc: 143 mg/dL
Hgb A1c MFr Bld: 6.6 % — ABNORMAL HIGH (ref 4.8–5.6)

## 2015-07-22 ENCOUNTER — Encounter: Payer: Self-pay | Admitting: Family Medicine

## 2015-07-22 ENCOUNTER — Ambulatory Visit (INDEPENDENT_AMBULATORY_CARE_PROVIDER_SITE_OTHER): Payer: BLUE CROSS/BLUE SHIELD | Admitting: Family Medicine

## 2015-07-22 VITALS — BP 124/70 | HR 80 | Ht 66.0 in | Wt 196.0 lb

## 2015-07-22 DIAGNOSIS — J40 Bronchitis, not specified as acute or chronic: Secondary | ICD-10-CM

## 2015-07-22 DIAGNOSIS — J0100 Acute maxillary sinusitis, unspecified: Secondary | ICD-10-CM | POA: Diagnosis not present

## 2015-07-22 MED ORDER — GUAIFENESIN-CODEINE 100-10 MG/5ML PO SOLN: 5.0000 mL | Freq: Three times a day (TID) | ORAL | Status: DC | PRN

## 2015-07-22 MED ORDER — AMOXICILLIN-POT CLAVULANATE 875-125 MG PO TABS: 1.0000 | ORAL_TABLET | Freq: Two times a day (BID) | ORAL | Status: DC

## 2015-07-22 NOTE — Progress Notes (Signed)
Name: Kevin Montgomery   MRN: [id number]    DOB: Nov 07, 1965   Date:07/22/2015       Progress Note  Subjective  Chief Complaint  Chief Complaint  Patient presents with  . Sinusitis    cough and cong- colored production    Sinusitis This is a new problem. The current episode started in the past 7 days. The problem has been waxing and waning since onset. The maximum temperature recorded prior to his arrival was 100.4 - 100.9 F. Associated symptoms include chills, congestion, coughing, diaphoresis, headaches, shortness of breath, sinus pressure, sneezing, a sore throat and swollen glands. Pertinent negatives include no ear pain or neck pain. Past treatments include acetaminophen and oral decongestants. The treatment provided mild relief.    No problem-specific assessment & plan notes found for this encounter.   Past Medical History  Diagnosis Date  . Hypertension   . Diabetes mellitus without complication [facility])     Past Surgical History  Procedure Laterality Date  . Lithotripsy      Family History  Problem Relation Age of Onset  . Hyperlipidemia Mother   . Cancer Father   . Cancer Paternal Uncle   . Cancer Paternal Grandmother     Social History   Social History  . Marital Status: Married    Spouse Name: N/A  . Number of Children: N/A  . Years of Education: N/A   Occupational History  . Not on file.   Social History Main Topics  . Smoking status: Never Smoker   . Smokeless tobacco: Not on file  . Alcohol Use: 0.0 oz/week    0 Standard drinks or equivalent per week  . Drug Use: No  . Sexual Activity: Yes   Other Topics Concern  . Not on file   Social History Narrative    No Known Allergies   Review of Systems  Constitutional: Positive for chills and diaphoresis. Negative for fever, weight loss and malaise/fatigue.  HENT: Positive for congestion, sinus pressure, sneezing and sore throat. Negative for ear discharge and ear pain.   Eyes: Negative for  blurred vision.  Respiratory: Positive for cough and shortness of breath. Negative for sputum production and wheezing.   Cardiovascular: Negative for chest pain, palpitations and leg swelling.  Gastrointestinal: Negative for heartburn, nausea, abdominal pain, diarrhea, constipation, blood in stool and melena.  Genitourinary: Negative for dysuria, urgency, frequency and hematuria.  Musculoskeletal: Negative for myalgias, back pain, joint pain and neck pain.  Skin: Negative for rash.  Neurological: Positive for headaches. Negative for dizziness, tingling, sensory change and focal weakness.  Endo/Heme/Allergies: Negative for environmental allergies and polydipsia. Does not bruise/bleed easily.  Psychiatric/Behavioral: Negative for depression and suicidal ideas. The patient is not nervous/anxious and does not have insomnia.      Objective  Filed Vitals:   07/22/15 1034  BP: 124/70  Pulse: 80  Height: 5\' 6"  (1.676 m)  Weight: 196 lb (88.905 kg)    Physical Exam  Constitutional: He is oriented to person, place, and time and well-developed, well-nourished, and in no distress.  HENT:  Head: Normocephalic.  Right Ear: External ear normal.  Left Ear: External ear normal.  Nose: Nose normal.  Mouth/Throat: Oropharynx is clear and moist.  Eyes: Conjunctivae and EOM are normal. Pupils are equal, round, and reactive to light. Right eye exhibits no discharge. Left eye exhibits no discharge. No scleral icterus.  Neck: Normal range of motion. Neck supple. No JVD present. No tracheal deviation present. No thyromegaly present.  Cardiovascular: Normal rate, regular rhythm, normal heart sounds and intact distal pulses.  Exam reveals no gallop and no friction rub.   No murmur heard. Pulmonary/Chest: Breath sounds normal. No respiratory distress. He has no wheezes. He has no rales.  Abdominal: Soft. Bowel sounds are normal. He exhibits no mass. There is no hepatosplenomegaly. There is no tenderness.  There is no rebound, no guarding and no CVA tenderness.  Musculoskeletal: Normal range of motion. He exhibits no edema or tenderness.  Lymphadenopathy:    He has no cervical adenopathy.  Neurological: He is alert and oriented to person, place, and time. He has normal sensation, normal strength, normal reflexes and intact cranial nerves. No cranial nerve deficit.  Skin: Skin is warm. No rash noted.  Psychiatric: Mood and affect normal.  Nursing note and vitals reviewed.     Assessment & Plan  Problem List Items Addressed This Visit    None    Visit Diagnoses    Acute maxillary sinusitis, recurrence not specified    -  Primary    Relevant Medications    amoxicillin-clavulanate (AUGMENTIN) 875-125 MG tablet    guaiFENesin-codeine 100-10 MG/5ML syrup    Bronchitis        Relevant Medications    amoxicillin-clavulanate (AUGMENTIN) 875-125 MG tablet    guaiFENesin-codeine 100-10 MG/5ML syrup         Dr. Brettany [facility]  07/22/2015

## 2015-07-26 ENCOUNTER — Other Ambulatory Visit: Payer: Self-pay

## 2015-07-27 ENCOUNTER — Other Ambulatory Visit: Payer: Self-pay

## 2015-07-27 DIAGNOSIS — J40 Bronchitis, not specified as acute or chronic: Secondary | ICD-10-CM

## 2015-07-27 MED ORDER — LEVOFLOXACIN 500 MG PO TABS: 500.0000 mg | ORAL_TABLET | Freq: Every day | ORAL | Status: DC

## 2015-08-31 ENCOUNTER — Encounter: Payer: Self-pay | Admitting: Family Medicine

## 2015-08-31 ENCOUNTER — Ambulatory Visit (INDEPENDENT_AMBULATORY_CARE_PROVIDER_SITE_OTHER): Payer: BLUE CROSS/BLUE SHIELD | Admitting: Family Medicine

## 2015-08-31 VITALS — BP 100/60 | HR 70 | Ht 66.0 in | Wt 195.0 lb

## 2015-08-31 DIAGNOSIS — J0100 Acute maxillary sinusitis, unspecified: Secondary | ICD-10-CM | POA: Diagnosis not present

## 2015-08-31 MED ORDER — AMOXICILLIN-POT CLAVULANATE 875-125 MG PO TABS: 1.0000 | ORAL_TABLET | Freq: Two times a day (BID) | ORAL | Status: DC

## 2015-08-31 MED ORDER — GUAIFENESIN-CODEINE 100-10 MG/5ML PO SYRP: 5.0000 mL | ORAL_SOLUTION | Freq: Three times a day (TID) | ORAL | Status: DC | PRN

## 2015-08-31 MED ORDER — PREDNISONE 10 MG PO TABS: 10.0000 mg | ORAL_TABLET | Freq: Every day | ORAL | Status: DC

## 2015-08-31 NOTE — Progress Notes (Signed)
Name: Kevin Montgomery   MRN: [id number]    DOB: 06/08/1966   Date:08/31/2015       Progress Note  Subjective  Chief Complaint  Chief Complaint  Patient presents with  . Sinusitis    cough and cong, facial pressure, dk brown/ green production- finished Levaquin on 08/06/15    Sinusitis This is a new problem. The current episode started in the past 7 days. The problem has been waxing and waning since onset. There has been no fever. The fever has been present for 1 to 2 days. Associated symptoms include chills, congestion, coughing, diaphoresis, shortness of breath, sinus pressure and sneezing. Pertinent negatives include no ear pain, headaches, hoarse voice, neck pain, sore throat or swollen glands. Past treatments include acetaminophen and oral decongestants. The treatment provided mild relief.    No problem-specific assessment & plan notes found for this encounter.   Past Medical History  Diagnosis Date  . Hypertension   . Diabetes mellitus without complication [facility])     Past Surgical History  Procedure Laterality Date  . Lithotripsy      Family History  Problem Relation Age of Onset  . Hyperlipidemia Mother   . Cancer Father   . Cancer Paternal Uncle   . Cancer Paternal Grandmother     Social History   Social History  . Marital Status: Married    Spouse Name: N/A  . Number of Children: N/A  . Years of Education: N/A   Occupational History  . Not on file.   Social History Main Topics  . Smoking status: Never Smoker   . Smokeless tobacco: Not on file  . Alcohol Use: 0.0 oz/week    0 Standard drinks or equivalent per week  . Drug Use: No  . Sexual Activity: Yes   Other Topics Concern  . Not on file   Social History Narrative    No Known Allergies   Review of Systems  Constitutional: Positive for chills and diaphoresis. Negative for fever, weight loss and malaise/fatigue.  HENT: Positive for congestion, sinus pressure and sneezing. Negative for ear  discharge, ear pain, hoarse voice and sore throat.   Eyes: Negative for blurred vision.  Respiratory: Positive for cough and shortness of breath. Negative for sputum production and wheezing.   Cardiovascular: Negative for chest pain, palpitations and leg swelling.  Gastrointestinal: Negative for heartburn, nausea, abdominal pain, diarrhea, constipation, blood in stool and melena.  Genitourinary: Negative for dysuria, urgency, frequency and hematuria.  Musculoskeletal: Negative for myalgias, back pain, joint pain and neck pain.  Skin: Negative for rash.  Neurological: Negative for dizziness, tingling, sensory change, focal weakness and headaches.  Endo/Heme/Allergies: Negative for environmental allergies and polydipsia. Does not bruise/bleed easily.  Psychiatric/Behavioral: Negative for depression and suicidal ideas. The patient is not nervous/anxious and does not have insomnia.      Objective  Filed Vitals:   08/31/15 1524  BP: 100/60  Pulse: 70  Height: 5\' 6"  (1.676 m)  Weight: 195 lb (88.451 kg)    Physical Exam  Constitutional: He is oriented to person, place, and time and well-developed, well-nourished, and in no distress.  HENT:  Head: Normocephalic.  Right Ear: External ear normal.  Left Ear: External ear normal.  Nose: Nose normal.  Mouth/Throat: Oropharynx is clear and moist.  Eyes: Conjunctivae and EOM are normal. Pupils are equal, round, and reactive to light. Right eye exhibits no discharge. Left eye exhibits no discharge. No scleral icterus.  Neck: Normal range of motion. Neck  supple. No JVD present. No tracheal deviation present. No thyromegaly present.  Cardiovascular: Normal rate, regular rhythm, normal heart sounds and intact distal pulses.  Exam reveals no gallop and no friction rub.   No murmur heard. Pulmonary/Chest: Breath sounds normal. No respiratory distress. He has no wheezes. He has no rales.  Abdominal: Soft. Bowel sounds are normal. He exhibits no mass.  There is no hepatosplenomegaly. There is no tenderness. There is no rebound, no guarding and no CVA tenderness.  Musculoskeletal: Normal range of motion. He exhibits no edema or tenderness.  Lymphadenopathy:    He has no cervical adenopathy.  Neurological: He is alert and oriented to person, place, and time. He has normal sensation, normal strength, normal reflexes and intact cranial nerves. No cranial nerve deficit.  Skin: Skin is warm. No rash noted.  Psychiatric: Mood and affect normal.  Nursing note and vitals reviewed.     Assessment & Plan  Problem List Items Addressed This Visit    None    Visit Diagnoses    Acute maxillary sinusitis, recurrence not specified    -  Primary    Relevant Medications    amoxicillin-clavulanate (AUGMENTIN) 875-125 MG tablet    guaiFENesin-codeine (ROBITUSSIN AC) 100-10 MG/5ML syrup    predniSONE (DELTASONE) 10 MG tablet         Dr. Grettell Ransdell [facility]  08/31/2015

## 2015-09-02 ENCOUNTER — Ambulatory Visit: Payer: Self-pay | Admitting: Family Medicine

## 2015-10-05 ENCOUNTER — Ambulatory Visit (INDEPENDENT_AMBULATORY_CARE_PROVIDER_SITE_OTHER): Payer: BLUE CROSS/BLUE SHIELD | Admitting: Family Medicine

## 2015-10-05 ENCOUNTER — Encounter: Payer: Self-pay | Admitting: Family Medicine

## 2015-10-05 VITALS — BP 134/70 | HR 80 | Ht 66.0 in | Wt 194.0 lb

## 2015-10-05 DIAGNOSIS — K429 Umbilical hernia without obstruction or gangrene: Secondary | ICD-10-CM | POA: Diagnosis not present

## 2015-10-05 DIAGNOSIS — K439 Ventral hernia without obstruction or gangrene: Secondary | ICD-10-CM

## 2015-10-05 DIAGNOSIS — G250 Essential tremor: Secondary | ICD-10-CM | POA: Diagnosis not present

## 2015-10-05 NOTE — Patient Instructions (Addendum)
Umbilical Herniorrhaphy Herniorrhaphy is surgery to repair a hernia. A hernia is the protrusion of a part of an organ through an abdominal opening. An umbilical hernia means that your hernia is in the area around your navel. If the hernia is not repaired, the gap could get bigger. Your intestines or other tissues, such as fat, could get trapped in the gap. This can lead to other health problems, such as blocked intestines. If the hernia is fixed before problems set in, you may be allowed to go home the same day as the surgery (outpatient). LET [facility] CARE PROVIDER KNOW ABOUT:  Allergies to food or medicine.  Medicines taken, including vitamins, herbs, eye drops, over-the-counter medicines, and creams.  Use of steroids (by mouth or creams).  Previous problems with anesthetics or numbing medicines.  History of bleeding problems or blood clots.  Previous surgery.  Other health problems, including diabetes and kidney problems.  Possibility of pregnancy, if this applies. RISKS AND COMPLICATIONS  Pain.  Excessive bleeding.  Hematoma. This is a pocket of blood that collects under the surgery site.  Infection at the surgery site.  Numbness at the surgery site.  Swelling and bruising.  Blood clots.  Intestinal damage (rare).  Scarring.  Skin damage.  Development of another hernia. This may require another surgery. BEFORE THE PROCEDURE  Ask your health care provider about changing or stopping your regular medicines. You may need to stop taking aspirin, nonsteroidal anti-inflammatory drugs (NSAIDs), vitamin E, and blood thinners as early as 2 weeks before the procedure.  Do not eat or drink for 8 hours before the procedure, or as directed by your health care provider.  You might be asked to shower or wash with an antibacterial soap before the procedure.  Wear comfortable clothes that will be easy to put on after the procedure. PROCEDURE You will be given an intravenous  (IV) tube. A needle will be inserted in your arm. Medicine will flow directly into your body through this needle. You might be given medicine to help you relax (sedative). You will be given medicine that numbs the area (local anesthetic) or medicine that makes you sleep (general anesthetic). If you have open surgery:  The surgeon will make a cut (incision) in your abdomen.  The gap in the muscle wall will be repaired. The surgeon may sew the edges together over the gap or use a mesh material to strengthen the area. When mesh is used, the body grows new, strong tissue into and around it. This new tissue closes the gap.  A drain might be put in to remove excess fluid from the body after surgery.  The surgeon will close the incision with stitches, glue, or staples. If you have laparoscopic surgery:  The surgeon will make several small incisions in your abdomen.  A thin, lighted tube (laparoscope) will be inserted into the abdomen through an incision. A camera is attached to the laparoscope that allows the surgeon to see inside the abdomen.  Tools will be inserted through the other incisions to repair the hernia. Usually, mesh is used to cover the gap.  The surgeon will close the incisions with stitches. AFTER THE PROCEDURE  You will be taken to a recovery area. A nurse will watch and check your progress.  When you are awake, feeling well, and taking fluids well, you may be allowed to go home. In some cases, you may need to stay overnight in the hospital.  Arrange for someone to drive you home.  This information is not intended to replace advice given to you by your health care provider. Make sure you discuss any questions you have with your health care provider.   Document Released: 08/25/2008 Document Revised: 06/19/2014 Document Reviewed: 08/30/2011 Elsevier Interactive Patient Education 2016 [employer]. Essential Tremor A tremor is trembling or shaking that you cannot control.  Most tremors affect the hands or arms. Tremors can also affect the head, vocal cords, face, and other parts of the body.  Essential tremor is a tremor without a known cause.  CAUSES Essential tremor has no known cause.  RISK FACTORS You may be at greater risk of essential tremor if:   You have a family member with essential tremor.   You are age 63 or older.   You take certain medicines. SIGNS AND SYMPTOMS The main sign of a tremor is uncontrolled and unintentional rhythmic shaking of a body part.  You may have difficulty eating with a spoon or fork.   You may have difficulty writing.   You may nod your head up and down or side to side.   You may have a quivering voice.  Your tremors:  May get worse over time.   May come and go.   May be more noticeable on one side of your body.   May get worse due to stress, fatigue, caffeine, and extreme heat or cold.  DIAGNOSIS Your health care provider can diagnose essential tremor based on your symptoms, medical history, and a physical examination. There is no single test to diagnose an essential tremor. However, your health care provider may perform a variety of tests to rule out other conditions. Tests may include:   Blood and urine tests.   Imaging studies of your brain, such as:   CT scan.   MRI.   A test that measures involuntary muscle movement (electromyogram). TREATMENT Your tremors may go away without treatment. Mild tremors may not need treatment if they do not affect your day-to-day life. Severe tremors may need to be treated using one or a combination of the following options:   Medicines. This may include medicine that is injected.  Lifestyle changes.   Physical therapy.  HOME CARE INSTRUCTIONS  Take medicines only as directed by your health care provider.   Limit alcohol intake to no more than 1 drink per day for nonpregnant women and 2 drinks per day for men. One drink equals 12 oz of beer,  5 oz of wine, or 1 oz of hard liquor.  Do not use any tobacco products, including cigarettes, chewing tobacco, or electronic cigarettes. If you need help quitting, ask your health care provider.  Take medicines only as directed by your health care provider.   Avoid extreme heat or cold.   Limit the amount of caffeine you consumeas directed by your health care provider.   Try to get eight hours of sleep each night.  Find ways to manage your stress, such as meditation or yoga.  Keep all follow-up visits as directed by your health care provider. This is important. This includes any physical therapy visits. SEEK MEDICAL CARE IF:  You experience any changes in the location or intensity of your tremors.   You start having a tremor after starting a new medicine.   You have tremor with other symptoms such as:   Numbness.   Tingling.   Pain.   Weakness.   Your tremor gets worse.   Your tremor interferes with your daily life.  This information is not intended to replace advice given to you by your health care provider. Make sure you discuss any questions you have with your health care provider.   Document Released: 06/19/2014 Document Reviewed: 06/19/2014 Elsevier Interactive Patient Education [employer].

## 2015-10-05 NOTE — Progress Notes (Signed)
Name: Kevin Montgomery   MRN: [id number]    DOB: 1966/05/28   Date:10/05/2015       Progress Note  Subjective  Chief Complaint  Chief Complaint  Patient presents with  . Hernia    causing discomfort- wants to get it taken care of this summer  . Tremors    bilateral hand shaking- gets worse when gets upset    Abdominal Pain This is a recurrent problem. The current episode started more than 1 year ago. The onset quality is gradual. The problem occurs daily. The problem has been waxing and waning. The pain is located in the generalized abdominal region. The pain is at a severity of 3/10. The pain is mild. The quality of the pain is aching. The abdominal pain does not radiate. Pertinent negatives include no belching, constipation, diarrhea, dysuria, fever, frequency, headaches, hematochezia, hematuria, melena, myalgias, nausea, vomiting or weight loss. The pain is aggravated by movement (supine to sitting position). The pain is relieved by being still. He has tried nothing for the symptoms. The treatment provided no relief.    No problem-specific assessment & plan notes found for this encounter.   Past Medical History  Diagnosis Date  . Hypertension   . Diabetes mellitus without complication [facility])     Past Surgical History  Procedure Laterality Date  . Lithotripsy      Family History  Problem Relation Age of Onset  . Hyperlipidemia Mother   . Cancer Father   . Cancer Paternal Uncle   . Cancer Paternal Grandmother     Social History   Social History  . Marital Status: Married    Spouse Name: N/A  . Number of Children: N/A  . Years of Education: N/A   Occupational History  . Not on file.   Social History Main Topics  . Smoking status: Never Smoker   . Smokeless tobacco: Not on file  . Alcohol Use: 0.0 oz/week    0 Standard drinks or equivalent per week  . Drug Use: No  . Sexual Activity: Yes   Other Topics Concern  . Not on file   Social History Narrative     No Known Allergies   Review of Systems  Constitutional: Negative for fever, chills, weight loss and malaise/fatigue.  HENT: Negative for ear discharge, ear pain and sore throat.   Eyes: Negative for blurred vision.  Respiratory: Negative for cough, sputum production, shortness of breath and wheezing.   Cardiovascular: Negative for chest pain, palpitations and leg swelling.  Gastrointestinal: Positive for abdominal pain. Negative for heartburn, nausea, vomiting, diarrhea, constipation, blood in stool, melena and hematochezia.  Genitourinary: Negative for dysuria, urgency, frequency and hematuria.  Musculoskeletal: Negative for myalgias, back pain, joint pain and neck pain.  Skin: Negative for rash.  Neurological: Negative for dizziness, tingling, sensory change, focal weakness and headaches.  Endo/Heme/Allergies: Negative for environmental allergies and polydipsia. Does not bruise/bleed easily.  Psychiatric/Behavioral: Negative for depression and suicidal ideas. The patient is not nervous/anxious and does not have insomnia.      Objective  Filed Vitals:   10/05/15 1335  BP: 134/70  Pulse: 80  Height: 5\' 6"  (1.676 m)  Weight: 194 lb (87.998 kg)    Physical Exam  Constitutional: He is oriented to person, place, and time and well-developed, well-nourished, and in no distress.  HENT:  Head: Normocephalic.  Right Ear: External ear normal.  Left Ear: External ear normal.  Nose: Nose normal.  Mouth/Throat: Oropharynx is clear and moist.  Eyes: Conjunctivae and EOM are normal. Pupils are equal, round, and reactive to light. Right eye exhibits no discharge. Left eye exhibits no discharge. No scleral icterus.  Neck: Normal range of motion. Neck supple. No JVD present. No tracheal deviation present. No thyromegaly present.  Cardiovascular: Normal rate, regular rhythm, normal heart sounds and intact distal pulses.  Exam reveals no gallop and no friction rub.   No murmur  heard. Pulmonary/Chest: Breath sounds normal. No respiratory distress. He has no wheezes. He has no rales.  Abdominal: Soft. Bowel sounds are normal. He exhibits no mass. There is no hepatosplenomegaly. There is no tenderness. There is no rebound, no guarding and no CVA tenderness.  Palpable/ observable umbillical/abdominal hernia  Musculoskeletal: Normal range of motion. He exhibits no edema or tenderness.  Lymphadenopathy:    He has no cervical adenopathy.  Neurological: He is alert and oriented to person, place, and time. He has normal sensation, normal strength, normal reflexes and intact cranial nerves. He displays tremor. No cranial nerve deficit.  Skin: Skin is warm. No rash noted.  Psychiatric: Mood and affect normal.  Nursing note and vitals reviewed.     Assessment & Plan  Problem List Items Addressed This Visit    None    Visit Diagnoses    Essential tremor    -  Primary    Ventral hernia, recurrence not specified        Relevant Orders    Ambulatory referral to General Surgery    Umbilical hernia without obstruction and without gangrene        Relevant Orders    Ambulatory referral to General Surgery         Dr. Otilio [facility]  10/05/2015

## 2015-10-07 ENCOUNTER — Other Ambulatory Visit: Payer: Self-pay

## 2015-10-08 ENCOUNTER — Encounter: Payer: Self-pay | Admitting: General Surgery

## 2015-10-08 ENCOUNTER — Ambulatory Visit (INDEPENDENT_AMBULATORY_CARE_PROVIDER_SITE_OTHER): Payer: BLUE CROSS/BLUE SHIELD | Admitting: General Surgery

## 2015-10-08 VITALS — BP 121/81 | HR 84 | Temp 98.1°F | Ht 66.0 in | Wt 194.8 lb

## 2015-10-08 DIAGNOSIS — M6208 Separation of muscle (nontraumatic), other site: Secondary | ICD-10-CM

## 2015-10-08 DIAGNOSIS — K429 Umbilical hernia without obstruction or gangrene: Secondary | ICD-10-CM

## 2015-10-08 NOTE — Progress Notes (Signed)
Patient ID: Kevin Montgomery, male   DOB: 12/22/1965, 50 y.o.   MRN: [id number]  CC: umbilical hernia  HPI Kevin Montgomery is a 50 y.o. male presents to clinic today for evaluation of umbilical hernia. Patient versus been there for some time however it is starting to cause him more discomfort with lifting. He runs a [employer] and has to lift heavy boxes from time to time. Patient states she is always felt to push it in and at one point was a "any" his abdomen has been flat for the last many months. He denies any other symptoms. He denies any fevers, chills, nausea, vomiting, diarrhea, outpatient, chest pain, shortness of breath. He is also noticed a bulge cephalad to this with doing sit ups in the shape of an oval. This is never been stuck out either he thinks is called a diastases. He is otherwise in his usual state of excellent health. 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 He's not had any kidney stones since he is taken over the meat market full-time.  HPI  Past Medical History  Diagnosis Date  . Hypertension   . Diabetes mellitus without complication [facility])     Past Surgical History  Procedure Laterality Date  . Lithotripsy      Family History  Problem Relation Age of Onset  . Hyperlipidemia Mother   . Cancer Father   . Cancer Paternal Uncle   . Cancer Paternal Grandmother     Social History Social History  Substance Use Topics  . Smoking status: Never Smoker   . Smokeless tobacco: None  . Alcohol Use: 0.0 oz/week    0 Standard drinks or equivalent per week    No Known Allergies  Current Outpatient Prescriptions  Medication Sig Dispense Refill  . aspirin 81 MG tablet Take 1 tablet (81 mg total) by mouth daily. 30 tablet 11  . Cinnamon 500 MG capsule Take 1,000 mg by mouth daily.    Marland Kitchen glipiZIDE-metformin (METAGLIP) 5-500 MG tablet Take 1 tablet by mouth 2 (two) times daily. 60  tablet 5  . lisinopril (PRINIVIL,ZESTRIL) 5 MG tablet Take 1 tablet (5 mg total) by mouth daily. 30 tablet 5   No current facility-administered medications for this visit.     Review of Systems A Multi-point review of systems was asked and was negative except for the findings documented in the history of present illness  Physical Exam Blood pressure 121/81, pulse 84, temperature 98.1 F (36.7 C), temperature source Oral, height 5\' 6"  (1.676 m), weight 88.361 kg (194 lb 12.8 oz). CONSTITUTIONAL: No acute distress. EYES: Pupils are equal, round, and reactive to light, Sclera are non-icteric. EARS, NOSE, MOUTH AND THROAT: The oropharynx is clear. The oral mucosa is pink and moist. Hearing is intact to voice. LYMPH NODES:  Lymph nodes in the neck are normal. RESPIRATORY:  Lungs are clear. There is normal respiratory effort, with equal breath sounds bilaterally, and without pathologic use of accessory muscles. CARDIOVASCULAR: Heart is regular without murmurs, gallops, or rubs. GI: The abdomen is large, soft, nontender, and nondistended. There is a visible umbilical hernia that is easily reducible. It is minimally tender upon reduction. Upon performing a crutch the patient also had an obvious diastases recti that was mildly tender on palpation. There is no hepatosplenomegaly. There are normal bowel sounds in all quadrants. GU: Rectal deferred.   MUSCULOSKELETAL: Normal muscle strength and tone. No cyanosis  or edema.   SKIN: Turgor is good and there are no pathologic skin lesions or ulcers. NEUROLOGIC: Motor and sensation is grossly normal. Cranial nerves are grossly intact. PSYCH:  Oriented to person, place and time. Affect is normal.  Data Reviewed No images and labs to review I have personally reviewed the patient's imaging, laboratory findings and medical records.    Assessment    Umbilical hernia with diastases recti    Plan    Discussed at length with patient the diagnosis of  umbilical hernia and diastases recti. The risks, signs, symptoms of incarceration or cannulation were discussed in detail and for him to return to clinic or the emergency room immediately should they occur. He voiced understanding. 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 Discussed this with the okay so long as he abided by the aforementioned return instructions. Otherwise, we will set him up with a follow-up appointment with my partner Dr. Azalee Course for preoperative visit the end of June with plans for surgery with her the second week of July.     Time spent with the patient was 60 minutes, with more than 50% of the time spent in face-to-face education, counseling and care coordination.     Clayburn Pert, MD FACS General Surgeon 10/08/2015, 11:40 AM

## 2015-10-08 NOTE — Patient Instructions (Signed)
You have requested for your Umbilical Hernia be repaired. We will arrange this to be done on 12/20/15 by Dr. Azalee Course at [facility]. (We are holding this OR time until you are seen at your appointment on 6/30)  Please see your (blue)pre-care sheet for information.  You will need to arrange to be off work for 1-2 weeks but will have to have a lifting restriction of no more than 15 lbs. For 6 weeks following your surgery.   Umbilical Herniorrhaphy Herniorrhaphy is surgery to repair a hernia. A hernia is the protrusion of a part of an organ through an abdominal opening. An umbilical hernia means that your hernia is in the area around your navel. If the hernia is not repaired, the gap could get bigger. Your intestines or other tissues, such as fat, could get trapped in the gap. This can lead to other health problems, such as blocked intestines. If the hernia is fixed before problems set in, you may be allowed to go home the same day as the surgery (outpatient). LET [facility] CARE PROVIDER KNOW ABOUT:  Allergies to food or medicine.  Medicines taken, including vitamins, herbs, eye drops, over-the-counter medicines, and creams.  Use of steroids (by mouth or creams).  Previous problems with anesthetics or numbing medicines.  History of bleeding problems or blood clots.  Previous surgery.  Other health problems, including diabetes and kidney problems.  Possibility of pregnancy, if this applies. RISKS AND COMPLICATIONS  Pain.  Excessive bleeding.  Hematoma. This is a pocket of blood that collects under the surgery site.  Infection at the surgery site.  Numbness at the surgery site.  Swelling and bruising.  Blood clots.  Intestinal damage (rare).  Scarring.  Skin damage.  Development of another hernia. This may require another surgery. BEFORE THE PROCEDURE  Ask your health care provider about changing or stopping your regular medicines. You may need to stop taking  aspirin, nonsteroidal anti-inflammatory drugs (NSAIDs), vitamin E, and blood thinners as early as 2 weeks before the procedure.  Do not eat or drink for 8 hours before the procedure, or as directed by your health care provider.  You might be asked to shower or wash with an antibacterial soap before the procedure.  Wear comfortable clothes that will be easy to put on after the procedure. PROCEDURE You will be given an intravenous (IV) tube. A needle will be inserted in your arm. Medicine will flow directly into your body through this needle. You might be given medicine to help you relax (sedative). You will be given medicine that numbs the area (local anesthetic) or medicine that makes you sleep (general anesthetic). If you have open surgery:  The surgeon will make a cut (incision) in your abdomen.  The gap in the muscle wall will be repaired. The surgeon may sew the edges together over the gap or use a mesh material to strengthen the area. When mesh is used, the body grows new, strong tissue into and around it. This new tissue closes the gap.  A drain might be put in to remove excess fluid from the body after surgery.  The surgeon will close the incision with stitches, glue, or staples. If you have laparoscopic surgery:  The surgeon will make several small incisions in your abdomen.  A thin, lighted tube (laparoscope) will be inserted into the abdomen through an incision. A camera is attached to the laparoscope that allows the surgeon to see inside the abdomen.  Tools will be inserted through  the other incisions to repair the hernia. Usually, mesh is used to cover the gap.  The surgeon will close the incisions with stitches. AFTER THE PROCEDURE  You will be taken to a recovery area. A nurse will watch and check your progress.  When you are awake, feeling well, and taking fluids well, you may be allowed to go home. In some cases, you may need to stay overnight in the  hospital.  Arrange for someone to drive you home.   This information is not intended to replace advice given to you by your health care provider. Make sure you discuss any questions you have with your health care provider.   Document Released: 08/25/2008 Document Revised: 06/19/2014 Document Reviewed: 08/30/2011 Elsevier Interactive Patient Education [employer].

## 2015-10-13 ENCOUNTER — Encounter: Payer: Self-pay | Admitting: Family Medicine

## 2015-10-13 ENCOUNTER — Ambulatory Visit (INDEPENDENT_AMBULATORY_CARE_PROVIDER_SITE_OTHER): Payer: BLUE CROSS/BLUE SHIELD | Admitting: Family Medicine

## 2015-10-13 VITALS — BP 120/70 | HR 70 | Ht 66.0 in | Wt 184.0 lb

## 2015-10-13 DIAGNOSIS — T148 Other injury of unspecified body region: Secondary | ICD-10-CM

## 2015-10-13 DIAGNOSIS — W57XXXA Bitten or stung by nonvenomous insect and other nonvenomous arthropods, initial encounter: Secondary | ICD-10-CM | POA: Diagnosis not present

## 2015-10-13 MED ORDER — DOXYCYCLINE HYCLATE 100 MG PO TABS: 100.0000 mg | ORAL_TABLET | Freq: Two times a day (BID) | ORAL | Status: DC

## 2015-10-13 NOTE — Progress Notes (Signed)
Name: Kevin Montgomery   MRN: [id number]    DOB: 08-13-1965   Date:10/13/2015       Progress Note  Subjective  Chief Complaint  Chief Complaint  Patient presents with  . Tick Removal    Animal Bite  The incident occurred at an unknown time (tick). Pertinent negatives include no chest pain, no abdominal pain, no nausea, no headaches, no neck pain, no focal weakness, no decreased responsiveness, no light-headedness, no loss of consciousness, no tingling and no cough. Services received include medications given.    No problem-specific assessment & plan notes found for this encounter.   Past Medical History  Diagnosis Date  . Hypertension   . Diabetes mellitus without complication [facility])     Past Surgical History  Procedure Laterality Date  . Lithotripsy      Family History  Problem Relation Age of Onset  . Hyperlipidemia Mother   . Cancer Father   . Cancer Paternal Uncle   . Cancer Paternal Grandmother     Social History   Social History  . Marital Status: Married    Spouse Name: N/A  . Number of Children: N/A  . Years of Education: N/A   Occupational History  . Not on file.   Social History Main Topics  . Smoking status: Never Smoker   . Smokeless tobacco: Not on file  . Alcohol Use: 0.0 oz/week    0 Standard drinks or equivalent per week  . Drug Use: No  . Sexual Activity: Yes   Other Topics Concern  . Not on file   Social History Narrative    No Known Allergies   Review of Systems  Constitutional: Negative for fever, chills, weight loss, malaise/fatigue and decreased responsiveness.  HENT: Negative for ear discharge, ear pain and sore throat.   Eyes: Negative for blurred vision.  Respiratory: Negative for cough, sputum production, shortness of breath and wheezing.   Cardiovascular: Negative for chest pain, palpitations and leg swelling.  Gastrointestinal: Negative for heartburn, nausea, abdominal pain, diarrhea, constipation, blood in stool and  melena.  Genitourinary: Negative for dysuria, urgency, frequency and hematuria.  Musculoskeletal: Negative for myalgias, back pain, joint pain and neck pain.  Skin: Negative for rash.  Neurological: Negative for dizziness, tingling, sensory change, focal weakness, loss of consciousness, light-headedness and headaches.  Endo/Heme/Allergies: Negative for environmental allergies and polydipsia. Does not bruise/bleed easily.  Psychiatric/Behavioral: Negative for depression and suicidal ideas. The patient is not nervous/anxious and does not have insomnia.      Objective  Filed Vitals:   10/13/15 1015  BP: 120/70  Pulse: 70  Height: 5\' 6"  (1.676 m)  Weight: 184 lb (83.462 kg)    Physical Exam  Skin: No rash noted. There is erythema. No pallor.  Tick removed  Nursing note and vitals reviewed.     Assessment & Plan  Problem List Items Addressed This Visit    None    Visit Diagnoses    Tick bite    -  Primary    Relevant Medications    doxycycline (VIBRA-TABS) 100 MG tablet         Dr. Kass Herberger [facility]  10/13/2015

## 2015-10-15 ENCOUNTER — Telehealth: Payer: Self-pay | Admitting: Surgery

## 2015-10-15 NOTE — Telephone Encounter (Signed)
Pt advised of pre op date/time and sx date. Sx: 12/20/15 with Dr Posey Rea umbilical hernia repair. Pre op: 12/15/15 between 9-1:00pm.  Patient made aware to call [phone], between 1-3:00pm the day before surgery, to find out what time to arrive.

## 2015-10-18 ENCOUNTER — Other Ambulatory Visit: Payer: Self-pay

## 2015-10-19 ENCOUNTER — Ambulatory Visit: Payer: Self-pay | Admitting: Family Medicine

## 2015-10-26 ENCOUNTER — Encounter: Payer: Self-pay | Admitting: Family Medicine

## 2015-10-26 ENCOUNTER — Ambulatory Visit (INDEPENDENT_AMBULATORY_CARE_PROVIDER_SITE_OTHER): Payer: BLUE CROSS/BLUE SHIELD | Admitting: Family Medicine

## 2015-10-26 VITALS — BP 100/70 | HR 72 | Ht 66.0 in | Wt 193.0 lb

## 2015-10-26 DIAGNOSIS — I10 Essential (primary) hypertension: Secondary | ICD-10-CM

## 2015-10-26 DIAGNOSIS — E119 Type 2 diabetes mellitus without complications: Secondary | ICD-10-CM | POA: Diagnosis not present

## 2015-10-26 DIAGNOSIS — W57XXXD Bitten or stung by nonvenomous insect and other nonvenomous arthropods, subsequent encounter: Secondary | ICD-10-CM

## 2015-10-26 DIAGNOSIS — S30860D Insect bite (nonvenomous) of lower back and pelvis, subsequent encounter: Secondary | ICD-10-CM

## 2015-10-26 MED ORDER — GLIPIZIDE-METFORMIN HCL 5-500 MG PO TABS: 1.0000 | ORAL_TABLET | Freq: Two times a day (BID) | ORAL | Status: DC

## 2015-10-26 MED ORDER — CEPHALEXIN 500 MG PO CAPS: 500.0000 mg | ORAL_CAPSULE | Freq: Three times a day (TID) | ORAL | Status: DC

## 2015-10-26 MED ORDER — LISINOPRIL 5 MG PO TABS: 5.0000 mg | ORAL_TABLET | Freq: Every day | ORAL | Status: DC

## 2015-10-26 NOTE — Progress Notes (Signed)
Name: Kevin Montgomery   MRN: [id number]    DOB: 18-Apr-1966   Date:10/26/2015       Progress Note  Subjective  Chief Complaint  Chief Complaint  Patient presents with  . Hypertension  . Diabetes    Hypertension This is a chronic problem. The current episode started more than 1 year ago. The problem has been waxing and waning since onset. The problem is controlled. Pertinent negatives include no anxiety, blurred vision, chest pain, headaches, malaise/fatigue, neck pain, orthopnea, palpitations, peripheral edema, PND, shortness of breath or sweats. There are no associated agents to hypertension. Past treatments include ACE inhibitors. The current treatment provides moderate improvement. There are no compliance problems.  There is no history of angina, kidney disease, CAD/MI, CVA, heart failure, left ventricular hypertrophy, PVD, renovascular disease or retinopathy. There is no history of chronic renal disease or a hypertension causing med.  Diabetes He presents for his follow-up diabetic visit. He has type 2 diabetes mellitus. His disease course has been stable. Pertinent negatives for hypoglycemia include no confusion, dizziness, headaches, hunger, mood changes, nervousness/anxiousness, pallor, seizures, sleepiness, speech difficulty, sweats or tremors. Pertinent negatives for diabetes include no blurred vision, no chest pain, no fatigue, no foot paresthesias, no foot ulcerations, no polydipsia, no polyphagia, no polyuria, no visual change, no weakness and no weight loss. There are no hypoglycemic complications. Symptoms are stable. There are no diabetic complications. Pertinent negatives for diabetic complications include no CVA, PVD or retinopathy. There are no known risk factors for coronary artery disease. Current diabetic treatment includes oral agent (monotherapy). He is following a generally healthy diet. His home blood glucose trend is fluctuating minimally. An ACE inhibitor/angiotensin II  receptor blocker is being taken. He does not see a podiatrist.Eye exam is not current.    No problem-specific assessment & plan notes found for this encounter.   Past Medical History  Diagnosis Date  . Hypertension   . Diabetes mellitus without complication [facility])     Past Surgical History  Procedure Laterality Date  . Lithotripsy      Family History  Problem Relation Age of Onset  . Hyperlipidemia Mother   . Cancer Father   . Cancer Paternal Uncle   . Cancer Paternal Grandmother     Social History   Social History  . Marital Status: Married    Spouse Name: N/A  . Number of Children: N/A  . Years of Education: N/A   Occupational History  . Not on file.   Social History Main Topics  . Smoking status: Never Smoker   . Smokeless tobacco: Not on file  . Alcohol Use: 0.0 oz/week    0 Standard drinks or equivalent per week  . Drug Use: No  . Sexual Activity: Yes   Other Topics Concern  . Not on file   Social History Narrative    No Known Allergies   Review of Systems  Constitutional: Negative for fever, chills, weight loss, malaise/fatigue and fatigue.  HENT: Negative for ear discharge, ear pain and sore throat.   Eyes: Negative for blurred vision.  Respiratory: Negative for cough, sputum production, shortness of breath and wheezing.   Cardiovascular: Negative for chest pain, palpitations, orthopnea, leg swelling and PND.  Gastrointestinal: Negative for heartburn, nausea, abdominal pain, diarrhea, constipation, blood in stool and melena.  Genitourinary: Negative for dysuria, urgency, frequency and hematuria.  Musculoskeletal: Negative for myalgias, back pain, joint pain and neck pain.  Skin: Negative for pallor and rash.  Neurological: Negative for  dizziness, tingling, tremors, sensory change, focal weakness, seizures, speech difficulty, weakness and headaches.  Endo/Heme/Allergies: Negative for environmental allergies, polydipsia and polyphagia. Does not  bruise/bleed easily.  Psychiatric/Behavioral: Negative for depression, suicidal ideas and confusion. The patient is not nervous/anxious and does not have insomnia.      Objective  Filed Vitals:   10/26/15 0757  BP: 100/70  Pulse: 72  Height: 5\' 6"  (1.676 m)  Weight: 193 lb (87.544 kg)    Physical Exam  Constitutional: He is oriented to person, place, and time and well-developed, well-nourished, and in no distress.  HENT:  Head: Normocephalic.  Right Ear: External ear normal.  Left Ear: External ear normal.  Nose: Nose normal.  Mouth/Throat: Oropharynx is clear and moist.  Eyes: Conjunctivae and EOM are normal. Pupils are equal, round, and reactive to light. Right eye exhibits no discharge. Left eye exhibits no discharge. No scleral icterus.  Neck: Normal range of motion. Neck supple. No JVD present. No tracheal deviation present. No thyromegaly present.  Cardiovascular: Normal rate, regular rhythm, normal heart sounds and intact distal pulses.  Exam reveals no gallop and no friction rub.   No murmur heard. Pulmonary/Chest: Breath sounds normal. No respiratory distress. He has no wheezes. He has no rales.  Abdominal: Soft. Bowel sounds are normal. He exhibits no mass. There is no hepatosplenomegaly. There is no tenderness. There is no rebound, no guarding and no CVA tenderness.  Musculoskeletal: Normal range of motion. He exhibits no edema or tenderness.  Lymphadenopathy:    He has no cervical adenopathy.  Neurological: He is alert and oriented to person, place, and time. He has normal sensation, normal strength and intact cranial nerves.  Skin: Skin is warm. No rash noted. There is erythema.  Psychiatric: Mood and affect normal.      Assessment & Plan  Problem List Items Addressed This Visit      Cardiovascular and Mediastinum   Essential hypertension - Primary   Relevant Medications   lisinopril (PRINIVIL,ZESTRIL) 5 MG tablet   Other Relevant Orders   Renal Function  Panel     Endocrine   Type 2 diabetes mellitus without complication, without long-term current use of insulin (HCC)   Relevant Medications   lisinopril (PRINIVIL,ZESTRIL) 5 MG tablet   glipiZIDE-metformin (METAGLIP) 5-500 MG tablet   Other Relevant Orders   Lipid Profile   Hemoglobin A1c    Other Visit Diagnoses    Tick bite of back, subsequent encounter        curretage    Relevant Medications    cephALEXin (KEFLEX) 500 MG capsule         Dr. Kayla Weekes [facility]  10/26/2015

## 2015-10-27 LAB — RENAL FUNCTION PANEL
ALBUMIN: 4.5 g/dL (ref 3.5–5.5)
BUN/Creatinine Ratio: 14 (ref 9–20)
BUN: 12 mg/dL (ref 6–24)
CHLORIDE: 96 mmol/L (ref 96–106)
CO2: 22 mmol/L (ref 18–29)
Calcium: 9.5 mg/dL (ref 8.7–10.2)
Creatinine, Ser: 0.85 mg/dL (ref 0.76–1.27)
GFR calc non Af Amer: 102 mL/min/{1.73_m2} (ref >59)
GFR, EST AFRICAN AMERICAN: 118 mL/min/{1.73_m2} (ref >59)
Glucose: 180 mg/dL — ABNORMAL HIGH (ref 65–99)
PHOSPHORUS: 3.4 mg/dL (ref 2.5–4.5)
POTASSIUM: 4.3 mmol/L (ref 3.5–5.2)
Sodium: 136 mmol/L (ref 134–144)

## 2015-10-27 LAB — LIPID PANEL
Chol/HDL Ratio: 4.3 ratio units (ref 0.0–5.0)
Cholesterol, Total: 159 mg/dL (ref 100–199)
HDL: 37 mg/dL — AB (ref >39)
LDL Calculated: 95 mg/dL (ref 0–99)
TRIGLYCERIDES: 134 mg/dL (ref 0–149)
VLDL CHOLESTEROL CAL: 27 mg/dL (ref 5–40)

## 2015-10-27 LAB — HEMOGLOBIN A1C
Est. average glucose Bld gHb Est-mCnc: 174 mg/dL
HEMOGLOBIN A1C: 7.7 % — AB (ref 4.8–5.6)

## 2015-10-27 MED ORDER — METFORMIN HCL 500 MG PO TABS: 500.0000 mg | ORAL_TABLET | Freq: Two times a day (BID) | ORAL | Status: DC

## 2015-10-27 NOTE — Addendum Note (Signed)
Addended by: Fredderick Severance on: 10/27/2015 07:55 AM   Modules accepted: Orders

## 2015-12-01 ENCOUNTER — Other Ambulatory Visit: Payer: Self-pay

## 2015-12-10 ENCOUNTER — Encounter: Payer: Self-pay | Admitting: Surgery

## 2015-12-10 ENCOUNTER — Ambulatory Visit (INDEPENDENT_AMBULATORY_CARE_PROVIDER_SITE_OTHER): Payer: BLUE CROSS/BLUE SHIELD | Admitting: Surgery

## 2015-12-10 ENCOUNTER — Other Ambulatory Visit: Payer: Self-pay

## 2015-12-10 VITALS — BP 114/79 | HR 86 | Temp 98.1°F | Ht 66.0 in | Wt 194.4 lb

## 2015-12-10 DIAGNOSIS — K429 Umbilical hernia without obstruction or gangrene: Secondary | ICD-10-CM | POA: Diagnosis not present

## 2015-12-10 NOTE — Patient Instructions (Signed)
Please call our office if you have any questions or concerns. Blue sheet provided with surgery information.

## 2015-12-13 ENCOUNTER — Encounter: Payer: Self-pay | Admitting: Surgery

## 2015-12-13 NOTE — Progress Notes (Signed)
Subjective:     Patient ID: Kevin Montgomery, male   DOB: Apr 21, 1966, 50 y.o.   MRN: [id number]  HPI  50 yr old male with incarcerated umbilical hernia as well as Well-controlled hypertension and moderately controlled diabetes mellitus. I did discuss with the patient that his previous A1c was 8 and he has had changes to his medication in that time and he is scheduled for another A1c in the next couple weeks. Patient states that his blood sugars have been much better controlled since going to thousand milligrams on the metformin but does state that he has had some diarrhea because of this as well as cramping abdominal pains. Patient states that he lifts boxes frequently at work and has noticed this hernia for a long time. And states that it does cause some tenderness in the bellybutton area.  He was seen by my partner back in April Dr. Adonis Huguenin for this but due to staffing issues at his work needed to wait to have surgery. He states there is been no changes since this time and he is now ready for the operation. Patient denies any fever, chills, increased weight gain, nausea, vomiting, diarrhea, constipation, abdominal pain or dysuria.   Past Medical History  Diagnosis Date  . Hypertension   . Diabetes mellitus without complication ([place])   Kidney Stones   Past Surgical History  Procedure Laterality Date  . Lithotripsy     Family History  Problem Relation Age of Onset  . Hyperlipidemia Mother   . Cancer Father   . Cancer Paternal Uncle   . Cancer Paternal Grandmother    Social History   Social History  . Marital Status: Married    Spouse Name: N/A  . Number of Children: N/A  . Years of Education: N/A   Social History Main Topics  . Smoking status: Never Smoker   . Smokeless tobacco: None  . Alcohol Use: 0.0 oz/week    0 Standard drinks or equivalent per week  . Drug Use: No  . Sexual Activity: Yes   Other Topics Concern  . None   Social History Narrative    Current outpatient  prescriptions:  .  aspirin 81 MG tablet, Take 1 tablet (81 mg total) by mouth daily., Disp: 30 tablet, Rfl: 11 .  Cinnamon 500 MG capsule, Take 1,000 mg by mouth daily., Disp: , Rfl:  .  glipiZIDE-metformin (METAGLIP) 5-500 MG tablet, Take 1 tablet by mouth 2 (two) times daily. (Patient taking differently: Take 2 tablets by mouth 2 (two) times daily. ), Disp: 60 tablet, Rfl: 5 .  lisinopril (PRINIVIL,ZESTRIL) 5 MG tablet, Take 1 tablet (5 mg total) by mouth daily., Disp: 30 tablet, Rfl: 5 .  metFORMIN (GLUCOPHAGE) 500 MG tablet, 1 tablet., Disp: , Rfl: 2 .  cephALEXin (KEFLEX) 500 MG capsule, Take 1 capsule by mouth. Reported on 12/10/2015, Disp: , Rfl: 0 No Known Allergies     Review of Systems  Constitutional: Negative for fever, activity change, appetite change, fatigue and unexpected weight change.  HENT: Negative for congestion and sore throat.   Respiratory: Negative for cough, chest tightness, shortness of breath and stridor.   Cardiovascular: Negative for chest pain, palpitations and leg swelling.  Gastrointestinal: Positive for abdominal distention. Negative for nausea, vomiting, abdominal pain, diarrhea, constipation and blood in stool.  Genitourinary: Negative for dysuria and hematuria.  Musculoskeletal: Negative for back pain and joint swelling.  Skin: Negative for color change, pallor, rash and wound.  Neurological: Negative for dizziness and  weakness.  Psychiatric/Behavioral: Negative for agitation. The patient is not nervous/anxious.   All other systems reviewed and are negative.      Filed Vitals:   12/10/15 1144  BP: 114/79  Pulse: 86  Temp: 98.1 F (36.7 C)    Objective:   Physical Exam  Constitutional: He is oriented to person, place, and time. He appears well-developed and well-nourished. No distress.  HENT:  Head: Normocephalic and atraumatic.  Right Ear: External ear normal.  Left Ear: External ear normal.  Nose: Nose normal.  Mouth/Throat: Oropharynx  is clear and moist. No oropharyngeal exudate.  Eyes: Conjunctivae and EOM are normal. Pupils are equal, round, and reactive to light. No scleral icterus.  Neck: Normal range of motion. Neck supple. No tracheal deviation present.  Cardiovascular: Normal rate, regular rhythm, normal heart sounds and intact distal pulses.  Exam reveals no gallop and no friction rub.   No murmur heard. Pulmonary/Chest: Effort normal and breath sounds normal. No respiratory distress. He has no wheezes. He has no rales.  Abdominal: Soft. Bowel sounds are normal. He exhibits no distension. There is no tenderness. There is no guarding.  2cm incarcerated umbilical hernia, able to minimally reduce, slight diastasis recti  Musculoskeletal: Normal range of motion. He exhibits no edema or tenderness.  Neurological: He is alert and oriented to person, place, and time. No cranial nerve deficit.  Skin: Skin is warm and dry. No rash noted. No erythema. No pallor.  Psychiatric: He has a normal mood and affect. His behavior is normal. Judgment and thought content normal.  Vitals reviewed.      CBC Latest Ref Rng 11/10/2013 02/26/2013 02/21/2013  WBC 3.8-10.6 x10 3/mm 3 6.5 9.9 6.0  Hemoglobin 13.0-18.0 g/dL 15.6 16.6 16.5  Hematocrit 40.0-52.0 % 44.1 45.7 47.4  Platelets 150-440 x10 3/mm 3 214 263 196   CMP Latest Ref Rng 10/26/2015 04/21/2015 11/10/2013  Glucose 65 - 99 mg/dL 180(H) 169(H) 152(H)  BUN 6 - 24 mg/dL 12 9 10   Creatinine 0.76 - 1.27 mg/dL 0.85 0.93 0.79  Sodium 134 - 144 mmol/L 136 139 135(L)  Potassium 3.5 - 5.2 mmol/L 4.3 4.1 3.8  Chloride 96 - 106 mmol/L 96 96(L) 102  CO2 18 - 29 mmol/L 22 23 27   Calcium 8.7 - 10.2 mg/dL 9.5 10.1 9.4  Total Protein 6.4-8.2 g/dL - - -  Total Bilirubin 0.2-1.0 mg/dL - - -  Alkaline Phos 50-136 Unit/L - - -  AST 15-37 Unit/L - - -  ALT 12-78 U/L - - -     Assessment:     50 yr old male with incarcerated umbilical hernia     Plan:     Personally reviewed the patient's  past medical history noting well-controlled hypertension and moderately controlled diabetes mellitus as above. I did discuss with the patient that doing elective surgery on a patient with A1c above 7.4 did put him at increased risk of infection however with the improvement in his hyperglycemia with increase in metformin and it is likely below this at this time. I also personally reviewed his past laboratory values which did note some hyperglycemia but otherwise were within normal limits.  I personally reviewed his past images of CT scan of abdomen and pelvis from 2014 which did showed some hepatic steatosis but no hernia was evident at that time. I also reviewed the radiology read from this as well.    I discussed possibility of incarceration, strangulation, enlargement in size over time, and the risk of  emergency surgery in the face of strangulation.  Also discussed the risk of surgery including recurrence which can be up to 30% in the case of complex hernias, use of prosthetic materials (mesh) and the increased risk of infxn, post-op infxn and the possible need for re-operation and removal of mesh if used, possibility of post-op SBO or ileus, and the risks of general anesthetic including MI, CVA, sudden death or even reaction to anesthetic medications. I also discussed what diastases recti is and that a larger surgery would be needed in order to fix that area. I recommended an open umbilical hernia repair with mesh placement at this time. Patient was given opportunity to ask questions and have them answered. He is in agreement with open umbilical hernia repair with mesh on July 10.

## 2015-12-15 ENCOUNTER — Inpatient Hospital Stay: Admission: RE | Admit: 2015-12-15 | Payer: Self-pay | Source: Ambulatory Visit

## 2015-12-15 ENCOUNTER — Encounter: Payer: Self-pay | Admitting: *Deleted

## 2015-12-15 NOTE — Patient Instructions (Signed)
  Your procedure is scheduled on: 12-20-15 [facility]) Report to Same Day Surgery 2nd floor medical mall To find out your arrival time please call [phone] between 1PM - 3PM on 12-17-15 (FRIDAY)  Remember: Instructions that are not followed completely may result in serious medical risk, up to and including death, or upon the discretion of your surgeon and anesthesiologist your surgery may need to be rescheduled.    _x___ 1. Do not eat food or drink liquids after midnight. No gum chewing or hard candies.     __x__ 2. No Alcohol for 24 hours before or after surgery.   __x__3. No Smoking for 24 prior to surgery.   ____  4. Bring all medications with you on the day of surgery if instructed.    __x__ 5. Notify your doctor if there is any change in your medical condition     (cold, fever, infections).     Do not wear jewelry, make-up, hairpins, clips or nail polish.  Do not wear lotions, powders, or perfumes. You may wear deodorant.  Do not shave 48 hours prior to surgery. Men may shave face and neck.  Do not bring valuables to the hospital.    [facility] is not responsible for any belongings or valuables.               Contacts, dentures or bridgework may not be worn into surgery.  Leave your suitcase in the car. After surgery it may be brought to your room.  For patients admitted to the hospital, discharge time is determined by your treatment team.   Patients discharged the day of surgery will not be allowed to drive home.    Please read over the following fact sheets that you were given:   [facility] Preparing for Surgery and or MRSA Information   _x___ Take these medicines the morning of surgery with A SIP OF WATER:    1. LISINOPRIL  2.  3.  4.  5.  6.  ____ Fleet Enema (as directed)   _x___ Use CHG Soap or sage wipes as directed on instruction sheet   ____ Use inhalers on the day of surgery and bring to hospital day of surgery  _X___ Stop metformin 2 days prior to  surgery-LAST DOSE ON Friday 12-17-15   ____ Take 1/2 of usual insulin dose the night before surgery and none on the morning of  surgery.   _X___ Stop aspirin or coumadin, or plavix-STOP ASPIRIN NOW  _x__ Stop Anti-inflammatories such as Advil, Aleve, Ibuprofen, Motrin, Naproxen,          Naprosyn, Goodies powders or aspirin products. Ok to take Tylenol.   _X___ Stop supplements until after surgery-STOP CINNAMON NOW   ____ Bring C-Pap to the hospital.

## 2015-12-16 ENCOUNTER — Encounter
Admission: RE | Admit: 2015-12-16 | Discharge: 2015-12-16 | Disposition: A | Discharge status: Home / Self Care | Payer: BLUE CROSS/BLUE SHIELD | Source: Ambulatory Visit | Attending: Surgery | Admitting: Surgery

## 2015-12-16 DIAGNOSIS — Z01810 Encounter for preprocedural cardiovascular examination: Secondary | ICD-10-CM | POA: Diagnosis not present

## 2015-12-20 ENCOUNTER — Ambulatory Visit: Payer: BLUE CROSS/BLUE SHIELD | Admitting: Certified Registered Nurse Anesthetist

## 2015-12-20 ENCOUNTER — Encounter
Admission: RE | Disposition: A | Discharge status: Home / Self Care | Payer: Self-pay | Source: Ambulatory Visit | Attending: Surgery

## 2015-12-20 ENCOUNTER — Ambulatory Visit
Admission: RE | Admit: 2015-12-20 | Discharge: 2015-12-20 | Disposition: A | Discharge status: Home / Self Care | Payer: BLUE CROSS/BLUE SHIELD | Source: Ambulatory Visit | Attending: Surgery | Admitting: Surgery

## 2015-12-20 ENCOUNTER — Encounter: Payer: Self-pay | Admitting: *Deleted

## 2015-12-20 DIAGNOSIS — I10 Essential (primary) hypertension: Secondary | ICD-10-CM | POA: Insufficient documentation

## 2015-12-20 DIAGNOSIS — Z87442 Personal history of urinary calculi: Secondary | ICD-10-CM | POA: Diagnosis not present

## 2015-12-20 DIAGNOSIS — K219 Gastro-esophageal reflux disease without esophagitis: Secondary | ICD-10-CM | POA: Diagnosis not present

## 2015-12-20 DIAGNOSIS — E669 Obesity, unspecified: Secondary | ICD-10-CM | POA: Diagnosis not present

## 2015-12-20 DIAGNOSIS — Z7982 Long term (current) use of aspirin: Secondary | ICD-10-CM | POA: Diagnosis not present

## 2015-12-20 DIAGNOSIS — K760 Fatty (change of) liver, not elsewhere classified: Secondary | ICD-10-CM | POA: Diagnosis not present

## 2015-12-20 DIAGNOSIS — E119 Type 2 diabetes mellitus without complications: Secondary | ICD-10-CM | POA: Insufficient documentation

## 2015-12-20 DIAGNOSIS — Z7984 Long term (current) use of oral hypoglycemic drugs: Secondary | ICD-10-CM | POA: Diagnosis not present

## 2015-12-20 DIAGNOSIS — Z6831 Body mass index (BMI) 31.0-31.9, adult: Secondary | ICD-10-CM | POA: Insufficient documentation

## 2015-12-20 DIAGNOSIS — Z79899 Other long term (current) drug therapy: Secondary | ICD-10-CM | POA: Diagnosis not present

## 2015-12-20 DIAGNOSIS — K420 Umbilical hernia with obstruction, without gangrene: Secondary | ICD-10-CM | POA: Insufficient documentation

## 2015-12-20 HISTORY — DX: Chronic kidney disease, unspecified: N18.9

## 2015-12-20 HISTORY — DX: Unspecified osteoarthritis, unspecified site: M19.90

## 2015-12-20 HISTORY — DX: Other specified postprocedural states: R11.2

## 2015-12-20 HISTORY — DX: Other complications of anesthesia, initial encounter: T88.59XA

## 2015-12-20 HISTORY — DX: Gastro-esophageal reflux disease without esophagitis: K21.9

## 2015-12-20 HISTORY — DX: Other specified postprocedural states: Z98.890

## 2015-12-20 HISTORY — PX: UMBILICAL HERNIA REPAIR: SHX196

## 2015-12-20 HISTORY — DX: Adverse effect of unspecified anesthetic, initial encounter: T41.45XA

## 2015-12-20 LAB — GLUCOSE, CAPILLARY
Glucose-Capillary: 159 mg/dL — ABNORMAL HIGH (ref 65–99)
Glucose-Capillary: 198 mg/dL — ABNORMAL HIGH (ref 65–99)

## 2015-12-20 SURGERY — REPAIR, HERNIA, UMBILICAL, ADULT
Anesthesia: General | Wound class: Clean

## 2015-12-20 MED ORDER — BUPIVACAINE HCL (PF) 0.5 % IJ SOLN
INTRAMUSCULAR | Status: DC | PRN
  Administered 2015-12-20: 30 mL

## 2015-12-20 MED ORDER — CEFAZOLIN SODIUM-DEXTROSE 2-4 GM/100ML-% IV SOLN
2.0000 g | INTRAVENOUS | Status: AC
  Administered 2015-12-20: 2 g via INTRAVENOUS

## 2015-12-20 MED ORDER — CELECOXIB 200 MG PO CAPS
ORAL_CAPSULE | ORAL | Status: AC
  Filled 2015-12-20: qty 2

## 2015-12-20 MED ORDER — PROMETHAZINE HCL 25 MG/ML IJ SOLN
6.2500 mg | Freq: Once | INTRAMUSCULAR | Status: AC
  Administered 2015-12-20: 6.25 mg via INTRAVENOUS
  Filled 2015-12-20: qty 1

## 2015-12-20 MED ORDER — ONDANSETRON HCL 4 MG/2ML IJ SOLN
4.0000 mg | Freq: Once | INTRAMUSCULAR | Status: AC | PRN
  Administered 2015-12-20: 4 mg via INTRAVENOUS

## 2015-12-20 MED ORDER — FENTANYL CITRATE (PF) 100 MCG/2ML IJ SOLN
25.0000 ug | INTRAMUSCULAR | Status: DC | PRN
  Administered 2015-12-20 (×2): 25 ug via INTRAVENOUS

## 2015-12-20 MED ORDER — MIDAZOLAM HCL 2 MG/2ML IJ SOLN
INTRAMUSCULAR | Status: DC | PRN
  Administered 2015-12-20: 2 mg via INTRAVENOUS

## 2015-12-20 MED ORDER — SODIUM CHLORIDE 0.9 % IV SOLN
INTRAVENOUS | Status: DC
  Administered 2015-12-20 (×2): via INTRAVENOUS

## 2015-12-20 MED ORDER — PROPOFOL 10 MG/ML IV BOLUS
INTRAVENOUS | Status: DC | PRN
  Administered 2015-12-20: 180 mg via INTRAVENOUS

## 2015-12-20 MED ORDER — CEFAZOLIN SODIUM-DEXTROSE 2-4 GM/100ML-% IV SOLN
INTRAVENOUS | Status: AC
  Filled 2015-12-20: qty 100

## 2015-12-20 MED ORDER — OXYCODONE-ACETAMINOPHEN 5-325 MG PO TABS: 1.0000 | ORAL_TABLET | ORAL | Status: DC | PRN

## 2015-12-20 MED ORDER — ONDANSETRON HCL 4 MG/2ML IJ SOLN
INTRAMUSCULAR | Status: AC
  Administered 2015-12-20: 4 mg via INTRAVENOUS
  Filled 2015-12-20: qty 2

## 2015-12-20 MED ORDER — FENTANYL CITRATE (PF) 100 MCG/2ML IJ SOLN
INTRAMUSCULAR | Status: DC | PRN
  Administered 2015-12-20 (×3): 50 ug via INTRAVENOUS

## 2015-12-20 MED ORDER — LIDOCAINE HCL (CARDIAC) 20 MG/ML IV SOLN
INTRAVENOUS | Status: DC | PRN
  Administered 2015-12-20: 100 mg via INTRAVENOUS

## 2015-12-20 MED ORDER — BUPIVACAINE HCL (PF) 0.5 % IJ SOLN
INTRAMUSCULAR | Status: AC
  Filled 2015-12-20: qty 30

## 2015-12-20 MED ORDER — ACETAMINOPHEN 500 MG PO TABS
ORAL_TABLET | ORAL | Status: AC
  Filled 2015-12-20: qty 2

## 2015-12-20 MED ORDER — CHLORHEXIDINE GLUCONATE CLOTH 2 % EX PADS: 6.0000 | MEDICATED_PAD | Freq: Once | CUTANEOUS | Status: DC

## 2015-12-20 MED ORDER — ACETAMINOPHEN 500 MG PO TABS
1000.0000 mg | ORAL_TABLET | ORAL | Status: AC
  Administered 2015-12-20: 1000 mg via ORAL

## 2015-12-20 MED ORDER — ACETAMINOPHEN 10 MG/ML IV SOLN
INTRAVENOUS | Status: DC | PRN
  Administered 2015-12-20: 1000 mg via INTRAVENOUS

## 2015-12-20 MED ORDER — CYCLOBENZAPRINE HCL 10 MG PO TABS: 10.0000 mg | ORAL_TABLET | Freq: Three times a day (TID) | ORAL | Status: DC | PRN

## 2015-12-20 MED ORDER — FENTANYL CITRATE (PF) 100 MCG/2ML IJ SOLN
INTRAMUSCULAR | Status: AC
  Administered 2015-12-20: 25 ug via INTRAVENOUS
  Filled 2015-12-20: qty 2

## 2015-12-20 MED ORDER — KETOROLAC TROMETHAMINE 30 MG/ML IJ SOLN
INTRAMUSCULAR | Status: DC | PRN
  Administered 2015-12-20: 30 mg via INTRAVENOUS

## 2015-12-20 MED ORDER — ONDANSETRON HCL 4 MG/2ML IJ SOLN
INTRAMUSCULAR | Status: DC | PRN
  Administered 2015-12-20: 4 mg via INTRAVENOUS

## 2015-12-20 MED ORDER — SUGAMMADEX SODIUM 200 MG/2ML IV SOLN
INTRAVENOUS | Status: DC | PRN
  Administered 2015-12-20: 176 mg via INTRAVENOUS

## 2015-12-20 MED ORDER — FAMOTIDINE 20 MG PO TABS
20.0000 mg | ORAL_TABLET | Freq: Once | ORAL | Status: AC
  Administered 2015-12-20: 20 mg via ORAL

## 2015-12-20 MED ORDER — ROCURONIUM BROMIDE 100 MG/10ML IV SOLN
INTRAVENOUS | Status: DC | PRN
  Administered 2015-12-20: 10 mg via INTRAVENOUS
  Administered 2015-12-20: 30 mg via INTRAVENOUS

## 2015-12-20 MED ORDER — CELECOXIB 200 MG PO CAPS
400.0000 mg | ORAL_CAPSULE | ORAL | Status: AC
  Administered 2015-12-20: 400 mg via ORAL

## 2015-12-20 MED ORDER — ACETAMINOPHEN 10 MG/ML IV SOLN
INTRAVENOUS | Status: AC
  Filled 2015-12-20: qty 100

## 2015-12-20 MED ORDER — FAMOTIDINE 20 MG PO TABS
ORAL_TABLET | ORAL | Status: AC
  Filled 2015-12-20: qty 1

## 2015-12-20 SURGICAL SUPPLY — 25 items
BLADE SURG 15 STRL LF DISP TIS (BLADE) ×1 IMPLANT
BLADE SURG 15 STRL SS (BLADE) ×1
CANISTER SUCT 1200ML W/VALVE (MISCELLANEOUS) ×2 IMPLANT
CHLORAPREP W/TINT 26ML (MISCELLANEOUS) ×2 IMPLANT
COTTON BALL STRL MEDIUM (GAUZE/BANDAGES/DRESSINGS) ×2 IMPLANT
DRAPE LAPAROTOMY 100X77 ABD (DRAPES) ×2 IMPLANT
DRSG TEGADERM 2-3/8X2-3/4 SM (GAUZE/BANDAGES/DRESSINGS) ×2 IMPLANT
ELECT REM PT RETURN 9FT ADLT (ELECTROSURGICAL) ×2
ELECTRODE REM PT RTRN 9FT ADLT (ELECTROSURGICAL) ×1 IMPLANT
GLOVE PI ORTHOPRO 6.5 (GLOVE) ×2
GLOVE PI ORTHOPRO STRL 6.5 (GLOVE) ×2 IMPLANT
GOWN STRL REUS W/ TWL LRG LVL3 (GOWN DISPOSABLE) ×2 IMPLANT
GOWN STRL REUS W/TWL LRG LVL3 (GOWN DISPOSABLE) ×2
KIT RM TURNOVER STRD PROC AR (KITS) ×2 IMPLANT
LABEL OR SOLS (LABEL) IMPLANT
LIQUID BAND (GAUZE/BANDAGES/DRESSINGS) ×2 IMPLANT
MESH VENTRALEX ST 2.5 CRC MED (Mesh General) ×2 IMPLANT
NEEDLE HYPO 22GX1.5 SAFETY (NEEDLE) ×2 IMPLANT
NS IRRIG 500ML POUR BTL (IV SOLUTION) ×2 IMPLANT
PACK BASIN MINOR ARMC (MISCELLANEOUS) ×2 IMPLANT
SUT MNCRL AB 3-0 PS2 27 (SUTURE) ×2 IMPLANT
SUT MNCRL AB 4-0 PS2 18 (SUTURE) ×2 IMPLANT
SUT PROLENE 2 0 SH DA (SUTURE) ×4 IMPLANT
SUT VIC AB 0 CT2 27 (SUTURE) ×2 IMPLANT
SYRINGE 10CC LL (SYRINGE) ×2 IMPLANT

## 2015-12-20 NOTE — H&P (View-Only) (Signed)
Subjective:     Patient ID: Kevin Montgomery, male   DOB: Apr 21, 1966, 50 y.o.   MRN: [id number]  HPI  50 yr old male with incarcerated umbilical hernia as well as Well-controlled hypertension and moderately controlled diabetes mellitus. I did discuss with the patient that his previous A1c was 8 and he has had changes to his medication in that time and he is scheduled for another A1c in the next couple weeks. Patient states that his blood sugars have been much better controlled since going to thousand milligrams on the metformin but does state that he has had some diarrhea because of this as well as cramping abdominal pains. Patient states that he lifts boxes frequently at work and has noticed this hernia for a long time. And states that it does cause some tenderness in the bellybutton area.  He was seen by my partner back in April Dr. Adonis Huguenin for this but due to staffing issues at his work needed to wait to have surgery. He states there is been no changes since this time and he is now ready for the operation. Patient denies any fever, chills, increased weight gain, nausea, vomiting, diarrhea, constipation, abdominal pain or dysuria.   Past Medical History  Diagnosis Date  . Hypertension   . Diabetes mellitus without complication ([place])   Kidney Stones   Past Surgical History  Procedure Laterality Date  . Lithotripsy     Family History  Problem Relation Age of Onset  . Hyperlipidemia Mother   . Cancer Father   . Cancer Paternal Uncle   . Cancer Paternal Grandmother    Social History   Social History  . Marital Status: Married    Spouse Name: N/A  . Number of Children: N/A  . Years of Education: N/A   Social History Main Topics  . Smoking status: Never Smoker   . Smokeless tobacco: None  . Alcohol Use: 0.0 oz/week    0 Standard drinks or equivalent per week  . Drug Use: No  . Sexual Activity: Yes   Other Topics Concern  . None   Social History Narrative    Current outpatient  prescriptions:  .  aspirin 81 MG tablet, Take 1 tablet (81 mg total) by mouth daily., Disp: 30 tablet, Rfl: 11 .  Cinnamon 500 MG capsule, Take 1,000 mg by mouth daily., Disp: , Rfl:  .  glipiZIDE-metformin (METAGLIP) 5-500 MG tablet, Take 1 tablet by mouth 2 (two) times daily. (Patient taking differently: Take 2 tablets by mouth 2 (two) times daily. ), Disp: 60 tablet, Rfl: 5 .  lisinopril (PRINIVIL,ZESTRIL) 5 MG tablet, Take 1 tablet (5 mg total) by mouth daily., Disp: 30 tablet, Rfl: 5 .  metFORMIN (GLUCOPHAGE) 500 MG tablet, 1 tablet., Disp: , Rfl: 2 .  cephALEXin (KEFLEX) 500 MG capsule, Take 1 capsule by mouth. Reported on 12/10/2015, Disp: , Rfl: 0 No Known Allergies     Review of Systems  Constitutional: Negative for fever, activity change, appetite change, fatigue and unexpected weight change.  HENT: Negative for congestion and sore throat.   Respiratory: Negative for cough, chest tightness, shortness of breath and stridor.   Cardiovascular: Negative for chest pain, palpitations and leg swelling.  Gastrointestinal: Positive for abdominal distention. Negative for nausea, vomiting, abdominal pain, diarrhea, constipation and blood in stool.  Genitourinary: Negative for dysuria and hematuria.  Musculoskeletal: Negative for back pain and joint swelling.  Skin: Negative for color change, pallor, rash and wound.  Neurological: Negative for dizziness and  weakness.  Psychiatric/Behavioral: Negative for agitation. The patient is not nervous/anxious.   All other systems reviewed and are negative.      Filed Vitals:   12/10/15 1144  BP: 114/79  Pulse: 86  Temp: 98.1 F (36.7 C)    Objective:   Physical Exam  Constitutional: He is oriented to person, place, and time. He appears well-developed and well-nourished. No distress.  HENT:  Head: Normocephalic and atraumatic.  Right Ear: External ear normal.  Left Ear: External ear normal.  Nose: Nose normal.  Mouth/Throat: Oropharynx  is clear and moist. No oropharyngeal exudate.  Eyes: Conjunctivae and EOM are normal. Pupils are equal, round, and reactive to light. No scleral icterus.  Neck: Normal range of motion. Neck supple. No tracheal deviation present.  Cardiovascular: Normal rate, regular rhythm, normal heart sounds and intact distal pulses.  Exam reveals no gallop and no friction rub.   No murmur heard. Pulmonary/Chest: Effort normal and breath sounds normal. No respiratory distress. He has no wheezes. He has no rales.  Abdominal: Soft. Bowel sounds are normal. He exhibits no distension. There is no tenderness. There is no guarding.  2cm incarcerated umbilical hernia, able to minimally reduce, slight diastasis recti  Musculoskeletal: Normal range of motion. He exhibits no edema or tenderness.  Neurological: He is alert and oriented to person, place, and time. No cranial nerve deficit.  Skin: Skin is warm and dry. No rash noted. No erythema. No pallor.  Psychiatric: He has a normal mood and affect. His behavior is normal. Judgment and thought content normal.  Vitals reviewed.      CBC Latest Ref Rng 11/10/2013 02/26/2013 02/21/2013  WBC 3.8-10.6 x10 3/mm 3 6.5 9.9 6.0  Hemoglobin 13.0-18.0 g/dL 15.6 16.6 16.5  Hematocrit 40.0-52.0 % 44.1 45.7 47.4  Platelets 150-440 x10 3/mm 3 214 263 196   CMP Latest Ref Rng 10/26/2015 04/21/2015 11/10/2013  Glucose 65 - 99 mg/dL 180(H) 169(H) 152(H)  BUN 6 - 24 mg/dL 12 9 10   Creatinine 0.76 - 1.27 mg/dL 0.85 0.93 0.79  Sodium 134 - 144 mmol/L 136 139 135(L)  Potassium 3.5 - 5.2 mmol/L 4.3 4.1 3.8  Chloride 96 - 106 mmol/L 96 96(L) 102  CO2 18 - 29 mmol/L 22 23 27   Calcium 8.7 - 10.2 mg/dL 9.5 10.1 9.4  Total Protein 6.4-8.2 g/dL - - -  Total Bilirubin 0.2-1.0 mg/dL - - -  Alkaline Phos 50-136 Unit/L - - -  AST 15-37 Unit/L - - -  ALT 12-78 U/L - - -     Assessment:     50 yr old male with incarcerated umbilical hernia     Plan:     Personally reviewed the patient's  past medical history noting well-controlled hypertension and moderately controlled diabetes mellitus as above. I did discuss with the patient that doing elective surgery on a patient with A1c above 7.4 did put him at increased risk of infection however with the improvement in his hyperglycemia with increase in metformin and it is likely below this at this time. I also personally reviewed his past laboratory values which did note some hyperglycemia but otherwise were within normal limits.  I personally reviewed his past images of CT scan of abdomen and pelvis from 2014 which did showed some hepatic steatosis but no hernia was evident at that time. I also reviewed the radiology read from this as well.    I discussed possibility of incarceration, strangulation, enlargement in size over time, and the risk of  emergency surgery in the face of strangulation.  Also discussed the risk of surgery including recurrence which can be up to 30% in the case of complex hernias, use of prosthetic materials (mesh) and the increased risk of infxn, post-op infxn and the possible need for re-operation and removal of mesh if used, possibility of post-op SBO or ileus, and the risks of general anesthetic including MI, CVA, sudden death or even reaction to anesthetic medications. I also discussed what diastases recti is and that a larger surgery would be needed in order to fix that area. I recommended an open umbilical hernia repair with mesh placement at this time. Patient was given opportunity to ask questions and have them answered. He is in agreement with open umbilical hernia repair with mesh on July 10.

## 2015-12-20 NOTE — Anesthesia Procedure Notes (Addendum)
Procedure Name: Intubation Performed by: Demetrius Charity Pre-anesthesia Checklist: Patient identified, Patient being monitored, Timeout performed, Emergency Drugs available and Suction available Patient Re-evaluated:Patient Re-evaluated prior to inductionOxygen Delivery Method: Circle system utilized Preoxygenation: Pre-oxygenation with 100% oxygen Intubation Type: IV induction Ventilation: Mask ventilation with difficulty and Oral airway inserted - appropriate to patient size Laryngoscope Size: Mac and 4 Grade View: Grade I Tube type: Oral Tube size: 7.5 mm Number of attempts: 1 Airway Equipment and Method: Stylet Placement Confirmation: ETT inserted through vocal cords under direct vision,  positive ETCO2 and breath sounds checked- equal and bilateral Secured at: 23 cm Tube secured with: Tape Dental Injury: Teeth and Oropharynx as per pre-operative assessment

## 2015-12-20 NOTE — Interval H&P Note (Signed)
History and Physical Interval Note:  12/20/2015 7:11 AM  Kevin Montgomery  has presented today for surgery, with the diagnosis of UMBILICAL HERNIA WITHOUT OBSTRUCTION  The various methods of treatment have been discussed with the patient and family. After consideration of risks, benefits and other options for treatment, the patient has consented to  Procedure(s): HERNIA REPAIR UMBILICAL ADULT (N/A) as a surgical intervention .  The patient's history has been reviewed, patient examined, no change in status, stable for surgery.  I have reviewed the patient's chart and labs.  Questions were answered to the patient's satisfaction.     Oprah Camarena L Ranya Fiddler

## 2015-12-20 NOTE — Anesthesia Preprocedure Evaluation (Signed)
Anesthesia Evaluation  Patient identified by MRN, date of birth, ID band Patient awake    Reviewed: Allergy & Precautions, NPO status , Patient's Chart, lab work & pertinent test results  History of Anesthesia Complications (+) PONV  Airway Mallampati: II       Dental  (+) Teeth Intact   Pulmonary neg pulmonary ROS,    breath sounds clear to auscultation       Cardiovascular Exercise Tolerance: Good hypertension, Pt. on medications  Rhythm:Regular Rate:Normal     Neuro/Psych    GI/Hepatic GERD  Medicated,  Endo/Other  diabetes, Type 2, Oral Hypoglycemic Agents  Renal/GU      Musculoskeletal negative musculoskeletal ROS (+)   Abdominal (+) + obese,   Peds  Hematology negative hematology ROS (+)   Anesthesia Other Findings   Reproductive/Obstetrics                             Anesthesia Physical Anesthesia Plan  ASA: II  Anesthesia Plan: General   Post-op Pain Management:    Induction: Intravenous  Airway Management Planned: Oral ETT  Additional Equipment:   Intra-op Plan:   Post-operative Plan: Extubation in OR  Informed Consent: I have reviewed the patients History and Physical, chart, labs and discussed the procedure including the risks, benefits and alternatives for the proposed anesthesia with the patient or authorized representative who has indicated his/her understanding and acceptance.     Plan Discussed with: CRNA  Anesthesia Plan Comments:         Anesthesia Quick Evaluation

## 2015-12-20 NOTE — Anesthesia Postprocedure Evaluation (Signed)
Anesthesia Post Note  Patient: Kevin Montgomery  Procedure(s) Performed: Procedure(s) (LRB): HERNIA REPAIR UMBILICAL ADULT (N/A)  Patient location during evaluation: PACU Anesthesia Type: General Level of consciousness: awake Pain management: pain level controlled Vital Signs Assessment: post-procedure vital signs reviewed and stable Respiratory status: spontaneous breathing Cardiovascular status: blood pressure returned to baseline Anesthetic complications: no    Last Vitals:  Filed Vitals:   12/20/15 0947 12/20/15 1000  BP: 124/83 132/82  Pulse: 83 73  Temp: 36.7 C 36 C  Resp: 8 14    Last Pain:  Filed Vitals:   12/20/15 1002  PainSc: 2                  VAN STAVEREN,Izzie Geers

## 2015-12-20 NOTE — Transfer of Care (Signed)
Immediate Anesthesia Transfer of Care Note  Patient: Kevin Montgomery  Procedure(s) Performed: Procedure(s): HERNIA REPAIR UMBILICAL ADULT (N/A)  Patient Location: PACU  Anesthesia Type:General  Level of Consciousness: awake, alert  and oriented  Airway & Oxygen Therapy: Patient Spontanous Breathing and Patient connected to face mask oxygen  Post-op Assessment: Report given to RN and Post -op Vital signs reviewed and stable  Post vital signs: Reviewed and stable  Last Vitals:  Filed Vitals:   12/20/15 0607 12/20/15 0903  BP: 140/91 148/93  Pulse: 74 99  Temp: 36.5 C 36.8 C  Resp: 20 15    Last Pain: There were no vitals filed for this visit.       Complications: No apparent anesthesia complications

## 2015-12-20 NOTE — Discharge Instructions (Signed)

## 2015-12-20 NOTE — OR Nursing (Signed)
Dr Laureen Abrahams called for severe nausea and vomiting--ordered phenergan 6.25 IV --given and patient more comfortable

## 2015-12-20 NOTE — Op Note (Signed)
Umbilical Herniorrhaphy Procedure Note  Indications: Incarcerated umbilical hernia  Pre-operative Diagnosis: Incarcerated umbilical hernia  Post-operative Diagnosis: Incarcerated umbilical hernia  Surgeon: Hubbard Robinson   Assistants: none  Anesthesia: General endotracheal anesthesia  ASA Class: 2  Procedure Details  The patient was seen in the Holding Room. The risks, benefits, complications, treatment options, and expected outcomes were discussed with the patient. The possibilities of reaction to medication, pulmonary aspiration, perforation of viscus, bleeding, recurrent infection, the need for additional procedures, failure to diagnose a condition, and creating a complication requiring transfusion or operation were discussed with the patient. The patient concurred with the proposed plan, giving informed consent.  The site of surgery properly noted/marked. The patient was taken to the operating room, identified as CHROSTOPHER TELLMAN and the procedure verified as Umbilical Herniorrhaphy. A Time Out was held and the above information confirmed.  The patient was placed supine.  After establishing general anesthesia, the abdomen was prepped and draped in standard fashion.  0.50% Marcaine with epinephrine was used to anesthetize the skin.  A periumbilical incision was created.  Dissection was carried down to the hernia sac located above the fascia and was mobilized from surrounding structures.  Intact fascia was identified circumferentially around the defect.  The preperitoneal fat was mobilized off the fascia circumferentially.  The defect in the preperitoneal fat was closed using running 2-0 Vicryl.  The fascial defect was measured at 2cm in size and 6.4cm Ventralex hernia patch was placed beneath the fascia and above the preperitoneal fat and secured in place using four U-stitches of 2-0 Prolene in the cardinal directions.  The fascia was then closed over the top of the mesh using 2-0 Prolene.  The soft tissue was irrigated and closed in layers using 2-0 Vicryl.  Hemostasis was confirmed.  The skin incision was closed in layers with a 4-0 Monocryl subcuticular closure.  Sterile glue was applied to incision site, allowed to dry and then cotton ball with small Tegaderm placed over area.   Instrument, sponge, and needle counts were correct prior to closure and at the conclusion of the case.   Findings: 2cm umbilical hernia, placement of 6.4cm Ventralex mesh  Estimated Blood Loss:  Minimal         Drains: none         Total IV Fluids: 1045ml         Specimens: none         Implants: Ventralex Hernia patch Mesh [id number]          Complications:  None; patient tolerated the procedure well.         Disposition: PACU - hemodynamically stable.         Condition: stable

## 2016-01-04 ENCOUNTER — Ambulatory Visit (INDEPENDENT_AMBULATORY_CARE_PROVIDER_SITE_OTHER): Payer: BLUE CROSS/BLUE SHIELD | Admitting: Surgery

## 2016-01-04 ENCOUNTER — Encounter: Payer: Self-pay | Admitting: Surgery

## 2016-01-04 VITALS — BP 136/85 | HR 80 | Temp 97.6°F | Ht 66.0 in | Wt 193.0 lb

## 2016-01-04 DIAGNOSIS — K429 Umbilical hernia without obstruction or gangrene: Secondary | ICD-10-CM

## 2016-01-04 NOTE — Patient Instructions (Signed)
Please call with any questions or concerns.

## 2016-01-04 NOTE — Progress Notes (Signed)
Outpatient postop visit  01/04/2016  EDDER Montgomery is an 50 y.o. male.    Procedure: Umbilical hernia repair with mesh  CC: Back to normal  HPI: This patient underwent an umbilical hernia repair with mesh by Dr. Azalee Course. 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 He has no pain no nausea or vomiting.  Medications reviewed.    Physical Exam:  BP 136/85   Pulse 80   Temp 97.6 F (36.4 C) (Oral)   Ht 5\' 6"  (1.676 m)   Wt 193 lb (87.5 kg)   BMI 31.15 kg/m     PE: Abdomen is soft nontender wound is clean no erythema no drainage    Assessment/Plan:  She doing very well he is already working but has restricted his lifting I suggested 4-6 weeks for lifting restriction of 25 pounds acknowledging that this is an arbitrary number but the patient is arty back to work and doing well but self restricting at this point. He'll follow up on an as-needed basis  Florene Glen, MD, FACS

## 2016-01-06 ENCOUNTER — Ambulatory Visit: Payer: BLUE CROSS/BLUE SHIELD | Admitting: Surgery

## 2016-07-11 ENCOUNTER — Other Ambulatory Visit: Payer: Self-pay

## 2016-08-14 ENCOUNTER — Other Ambulatory Visit: Payer: Self-pay | Admitting: Family Medicine

## 2016-08-17 ENCOUNTER — Encounter: Payer: Self-pay | Admitting: Family Medicine

## 2016-08-17 ENCOUNTER — Ambulatory Visit (INDEPENDENT_AMBULATORY_CARE_PROVIDER_SITE_OTHER): Payer: BLUE CROSS/BLUE SHIELD | Admitting: Family Medicine

## 2016-08-17 VITALS — BP 110/70 | HR 80 | Temp 98.0°F | Ht 66.0 in | Wt 198.0 lb

## 2016-08-17 DIAGNOSIS — J40 Bronchitis, not specified as acute or chronic: Secondary | ICD-10-CM

## 2016-08-17 DIAGNOSIS — J0100 Acute maxillary sinusitis, unspecified: Secondary | ICD-10-CM | POA: Diagnosis not present

## 2016-08-17 MED ORDER — GUAIFENESIN-CODEINE 100-10 MG/5ML PO SYRP: 5.0000 mL | ORAL_SOLUTION | Freq: Three times a day (TID) | ORAL | 0 refills | Status: DC | PRN

## 2016-08-17 MED ORDER — AMOXICILLIN-POT CLAVULANATE 875-125 MG PO TABS: 1.0000 | ORAL_TABLET | Freq: Two times a day (BID) | ORAL | 1 refills | Status: DC

## 2016-08-17 NOTE — Progress Notes (Signed)
Name: Kevin Montgomery   MRN: [id number]    DOB: 28-Oct-1965   Date:08/17/2016       Progress Note  Subjective  Chief Complaint  Chief Complaint  Patient presents with  . Sinusitis    cough and cong, drainage and sore throat in the am- green production    Sinusitis  This is a new problem. The current episode started yesterday. The problem has been gradually worsening since onset. There has been no fever. His pain is at a severity of 4/10. Associated symptoms include congestion, coughing, diaphoresis, headaches, sinus pressure, sneezing and a sore throat. Pertinent negatives include no chills, ear pain, hoarse voice, neck pain, shortness of breath or swollen glands.  Cough  This is a new problem. The current episode started today. The problem has been rapidly worsening. The problem occurs every few minutes. The cough is productive of purulent sputum. Associated symptoms include ear congestion, headaches and a sore throat. Pertinent negatives include no chest pain, chills, ear pain, fever, heartburn, myalgias, nasal congestion, postnasal drip, rash, shortness of breath, weight loss or wheezing. The symptoms are aggravated by pollens. The treatment provided moderate relief. There is no history of environmental allergies.    No problem-specific Assessment & Plan notes found for this encounter.   Past Medical History:  Diagnosis Date  . Arthritis   . Chronic kidney disease    h/o kidney stones  . Complication of anesthesia   . Diabetes mellitus without complication ([place])   . GERD (gastroesophageal reflux disease)    no meds  . Hypertension    pt denies and states that Lisinopril is for kidney protection only from diabetes  . PONV (postoperative nausea and vomiting)     Past Surgical History:  Procedure Laterality Date  . KIDNEY STONE SURGERY    . LITHOTRIPSY    . UMBILICAL HERNIA REPAIR N/A 12/20/2015   Procedure: HERNIA REPAIR UMBILICAL ADULT;  Surgeon: Hubbard Robinson, MD;   Location: [facility] ORS;  Service: General;  Laterality: N/A;    Family History  Problem Relation Age of Onset  . Hyperlipidemia Mother   . Cancer Father     Pancreatic  . Diabetes Father   . Cancer Paternal Uncle   . Cancer Paternal Grandmother     Social History   Social History  . Marital status: Married    Spouse name: N/A  . Number of children: N/A  . Years of education: N/A   Occupational History  . Not on file.   Social History Main Topics  . Smoking status: Never Smoker  . Smokeless tobacco: Never Used  . Alcohol use 0.0 oz/week     Comment: rare  . Drug use: No  . Sexual activity: Yes   Other Topics Concern  . Not on file   Social History Narrative  . No narrative on file    No Known Allergies  Outpatient Medications Prior to Visit  Medication Sig Dispense Refill  . aspirin 81 MG tablet Take 1 tablet (81 mg total) by mouth daily. 30 tablet 11  . Cinnamon 500 MG capsule Take 1,000 mg by mouth daily.    Marland Kitchen glipiZIDE-metformin (METAGLIP) 5-500 MG tablet Take 1 tablet by mouth 2 (two) times daily. (Patient taking differently: Take 2 tablets by mouth 2 (two) times daily. ) 60 tablet 5  . lisinopril (PRINIVIL,ZESTRIL) 5 MG tablet Take 1 tablet (5 mg total) by mouth daily. (Patient taking differently: Take 5 mg by mouth every morning. ) 30 tablet  5  . metFORMIN (GLUCOPHAGE) 500 MG tablet Take 1 tablet by mouth at bedtime.   2   No facility-administered medications prior to visit.     Review of Systems  Constitutional: Positive for diaphoresis. Negative for chills, fever, malaise/fatigue and weight loss.  HENT: Positive for congestion, sinus pressure, sneezing and sore throat. Negative for ear discharge, ear pain, hoarse voice and postnasal drip.   Eyes: Negative for blurred vision.  Respiratory: Positive for cough. Negative for sputum production, shortness of breath and wheezing.   Cardiovascular: Negative for chest pain, palpitations and leg swelling.   Gastrointestinal: Negative for abdominal pain, blood in stool, constipation, diarrhea, heartburn, melena and nausea.  Genitourinary: Negative for dysuria, frequency, hematuria and urgency.  Musculoskeletal: Negative for back pain, joint pain, myalgias and neck pain.  Skin: Negative for rash.  Neurological: Positive for headaches. Negative for dizziness, tingling, sensory change and focal weakness.  Endo/Heme/Allergies: Negative for environmental allergies and polydipsia. Does not bruise/bleed easily.  Psychiatric/Behavioral: Negative for depression and suicidal ideas. The patient is not nervous/anxious and does not have insomnia.      Objective  Vitals:   08/17/16 1546  BP: 110/70  Pulse: 80  Temp: 98 F (36.7 C)  TempSrc: Oral  Weight: 198 lb (89.8 kg)  Height: 5\' 6"  (1.676 m)    Physical Exam  Constitutional: He is oriented to person, place, and time and well-developed, well-nourished, and in no distress.  HENT:  Head: Normocephalic.  Right Ear: External ear normal.  Left Ear: External ear normal.  Nose: Right sinus exhibits maxillary sinus tenderness. Left sinus exhibits maxillary sinus tenderness.  Mouth/Throat: Oropharynx is clear and moist.  Eyes: Conjunctivae and EOM are normal. Pupils are equal, round, and reactive to light. Right eye exhibits no discharge. Left eye exhibits no discharge. No scleral icterus.  Neck: Normal range of motion. Neck supple. No JVD present. No tracheal deviation present. No thyromegaly present.  Cardiovascular: Normal rate, regular rhythm, normal heart sounds and intact distal pulses.  Exam reveals no gallop and no friction rub.   No murmur heard. Pulmonary/Chest: Breath sounds normal. No respiratory distress. He has no wheezes. He has no rales.  Abdominal: Soft. Bowel sounds are normal. He exhibits no mass. There is no hepatosplenomegaly. There is no tenderness. There is no rebound, no guarding and no CVA tenderness.  Musculoskeletal: Normal  range of motion. He exhibits no edema or tenderness.  Lymphadenopathy:    He has no cervical adenopathy.  Neurological: He is alert and oriented to person, place, and time. He has normal sensation, normal strength, normal reflexes and intact cranial nerves. No cranial nerve deficit.  Skin: Skin is warm. No rash noted.  Psychiatric: Mood and affect normal.  Nursing note and vitals reviewed.     Assessment & Plan  Problem List Items Addressed This Visit    None    Visit Diagnoses    Acute maxillary sinusitis, recurrence not specified    -  Primary   Relevant Medications   amoxicillin-clavulanate (AUGMENTIN) 875-125 MG tablet   guaiFENesin-codeine (ROBITUSSIN AC) 100-10 MG/5ML syrup   Bronchitis       Relevant Medications   amoxicillin-clavulanate (AUGMENTIN) 875-125 MG tablet   guaiFENesin-codeine (ROBITUSSIN AC) 100-10 MG/5ML syrup      Meds ordered this encounter  Medications  . amoxicillin-clavulanate (AUGMENTIN) 875-125 MG tablet    Sig: Take 1 tablet by mouth 2 (two) times daily.    Dispense:  20 tablet    Refill:  1  .  guaiFENesin-codeine (ROBITUSSIN AC) 100-10 MG/5ML syrup    Sig: Take 5 mLs by mouth 3 (three) times daily as needed for cough.    Dispense:  150 mL    Refill:  0      Dr. Brynnly Bonet [facility]  08/17/16

## 2016-09-07 ENCOUNTER — Ambulatory Visit (INDEPENDENT_AMBULATORY_CARE_PROVIDER_SITE_OTHER): Payer: BLUE CROSS/BLUE SHIELD | Admitting: Family Medicine

## 2016-09-07 ENCOUNTER — Encounter: Payer: Self-pay | Admitting: Family Medicine

## 2016-09-07 DIAGNOSIS — E119 Type 2 diabetes mellitus without complications: Secondary | ICD-10-CM

## 2016-09-07 DIAGNOSIS — I10 Essential (primary) hypertension: Secondary | ICD-10-CM | POA: Diagnosis not present

## 2016-09-07 MED ORDER — LISINOPRIL 5 MG PO TABS: 5.0000 mg | ORAL_TABLET | ORAL | 1 refills | Status: DC

## 2016-09-07 MED ORDER — GLIPIZIDE-METFORMIN HCL 5-500 MG PO TABS: 1.0000 | ORAL_TABLET | Freq: Two times a day (BID) | ORAL | 1 refills | Status: DC

## 2016-09-07 MED ORDER — METFORMIN HCL 500 MG PO TABS: 500.0000 mg | ORAL_TABLET | Freq: Every day | ORAL | 1 refills | Status: DC

## 2016-09-07 NOTE — Progress Notes (Signed)
Name: Kevin Montgomery   MRN: [id number]    DOB: 11-Jan-1966   Date:09/07/2016       Progress Note  Subjective  Chief Complaint  Chief Complaint  Patient presents with  . Hypertension  . Diabetes    Hypertension  This is a chronic problem. The current episode started more than 1 year ago. The problem has been waxing and waning since onset. The problem is controlled. Pertinent negatives include no anxiety, blurred vision, chest pain, headaches, malaise/fatigue, neck pain, orthopnea, palpitations, peripheral edema, PND, shortness of breath or sweats. There are no associated agents to hypertension. Risk factors for coronary artery disease include diabetes mellitus, dyslipidemia and obesity. Past treatments include ACE inhibitors. The current treatment provides moderate improvement. There are no compliance problems.  There is no history of angina, kidney disease, CAD/MI, CVA, heart failure, left ventricular hypertrophy, PVD or retinopathy. There is no history of a hypertension causing med or renovascular disease.  Diabetes  He presents for his follow-up diabetic visit. His disease course has been stable. Pertinent negatives for hypoglycemia include no dizziness, headaches, nervousness/anxiousness or sweats. Pertinent negatives for diabetes include no blurred vision, no chest pain, no fatigue, no foot ulcerations, no polydipsia, no polyphagia, no polyuria, no visual change, no weakness and no weight loss. Symptoms are stable. Pertinent negatives for diabetic complications include no CVA, heart disease, nephropathy, peripheral neuropathy, PVD or retinopathy. Risk factors for coronary artery disease include diabetes mellitus, dyslipidemia and hypertension. Current diabetic treatment includes oral agent (dual therapy). He is compliant with treatment all of the time. His weight is stable. He is following a generally healthy diet. He participates in exercise daily. An ACE inhibitor/angiotensin II receptor blocker  is being taken. He does not see a podiatrist.Eye exam is not current.    No problem-specific Assessment & Plan notes found for this encounter.   Past Medical History:  Diagnosis Date  . Arthritis   . Chronic kidney disease    h/o kidney stones  . Complication of anesthesia   . Diabetes mellitus without complication ([place])   . GERD (gastroesophageal reflux disease)    no meds  . Hypertension    pt denies and states that Lisinopril is for kidney protection only from diabetes  . PONV (postoperative nausea and vomiting)     Past Surgical History:  Procedure Laterality Date  . KIDNEY STONE SURGERY    . LITHOTRIPSY    . UMBILICAL HERNIA REPAIR N/A 12/20/2015   Procedure: HERNIA REPAIR UMBILICAL ADULT;  Surgeon: Hubbard Robinson, MD;  Location: [facility] ORS;  Service: General;  Laterality: N/A;    Family History  Problem Relation Age of Onset  . Hyperlipidemia Mother   . Cancer Father     Pancreatic  . Diabetes Father   . Cancer Paternal Uncle   . Cancer Paternal Grandmother     Social History   Social History  . Marital status: Married    Spouse name: N/A  . Number of children: N/A  . Years of education: N/A   Occupational History  . Not on file.   Social History Main Topics  . Smoking status: Never Smoker  . Smokeless tobacco: Never Used  . Alcohol use 0.0 oz/week     Comment: rare  . Drug use: No  . Sexual activity: Yes   Other Topics Concern  . Not on file   Social History Narrative  . No narrative on file    No Known Allergies  Outpatient Medications Prior to  Visit  Medication Sig Dispense Refill  . aspirin 81 MG tablet Take 1 tablet (81 mg total) by mouth daily. 30 tablet 11  . Cinnamon 500 MG capsule Take 1,000 mg by mouth daily.    Marland Kitchen glipiZIDE-metformin (METAGLIP) 5-500 MG tablet Take 1 tablet by mouth 2 (two) times daily. (Patient taking differently: Take 2 tablets by mouth 2 (two) times daily. ) 60 tablet 5  . lisinopril (PRINIVIL,ZESTRIL) 5 MG  tablet Take 1 tablet (5 mg total) by mouth daily. (Patient taking differently: Take 5 mg by mouth every morning. ) 30 tablet 5  . metFORMIN (GLUCOPHAGE) 500 MG tablet Take 1 tablet by mouth at bedtime.   2  . amoxicillin-clavulanate (AUGMENTIN) 875-125 MG tablet Take 1 tablet by mouth 2 (two) times daily. 20 tablet 1  . guaiFENesin-codeine (ROBITUSSIN AC) 100-10 MG/5ML syrup Take 5 mLs by mouth 3 (three) times daily as needed for cough. 150 mL 0   No facility-administered medications prior to visit.     Review of Systems  Constitutional: Negative for chills, fatigue, fever, malaise/fatigue and weight loss.  HENT: Negative for ear discharge, ear pain and sore throat.   Eyes: Negative for blurred vision.  Respiratory: Negative for cough, sputum production, shortness of breath and wheezing.   Cardiovascular: Negative for chest pain, palpitations, orthopnea, leg swelling and PND.  Gastrointestinal: Negative for abdominal pain, blood in stool, constipation, diarrhea, heartburn, melena and nausea.  Genitourinary: Negative for dysuria, frequency, hematuria and urgency.  Musculoskeletal: Negative for back pain, joint pain, myalgias and neck pain.  Skin: Negative for rash.  Neurological: Negative for dizziness, tingling, sensory change, focal weakness, weakness and headaches.  Endo/Heme/Allergies: Negative for environmental allergies, polydipsia and polyphagia. Does not bruise/bleed easily.  Psychiatric/Behavioral: Negative for depression and suicidal ideas. The patient is not nervous/anxious and does not have insomnia.      Objective  Vitals:   09/07/16 0810  BP: 100/64  Pulse: 72  Weight: 193 lb (87.5 kg)  Height: 5\' 6"  (1.676 m)    Physical Exam  Constitutional: He is oriented to person, place, and time and well-developed, well-nourished, and in no distress.  HENT:  Head: Normocephalic.  Right Ear: External ear normal.  Left Ear: External ear normal.  Nose: Nose normal.   Mouth/Throat: Oropharynx is clear and moist.  Eyes: Conjunctivae and EOM are normal. Pupils are equal, round, and reactive to light. Right eye exhibits no discharge. Left eye exhibits no discharge. No scleral icterus.  Neck: Normal range of motion. Neck supple. No JVD present. No tracheal deviation present. No thyromegaly present.  Cardiovascular: Normal rate, regular rhythm, normal heart sounds and intact distal pulses.  Exam reveals no gallop and no friction rub.   No murmur heard. Pulmonary/Chest: Breath sounds normal. No respiratory distress. He has no wheezes. He has no rales.  Abdominal: Soft. Bowel sounds are normal. He exhibits no mass. There is no hepatosplenomegaly. There is no tenderness. There is no rebound, no guarding and no CVA tenderness.  Musculoskeletal: Normal range of motion. He exhibits no edema or tenderness.  Lymphadenopathy:    He has no cervical adenopathy.  Neurological: He is alert and oriented to person, place, and time. He has normal sensation, normal strength, normal reflexes and intact cranial nerves. No cranial nerve deficit.  Skin: Skin is warm. No rash noted.  Psychiatric: Mood and affect normal.  Nursing note and vitals reviewed.     Assessment & Plan  Problem List Items Addressed This Visit  Cardiovascular and Mediastinum   Essential hypertension   Relevant Medications   lisinopril (PRINIVIL,ZESTRIL) 5 MG tablet   Other Relevant Orders   Renal Function Panel     Endocrine   Type 2 diabetes mellitus without complication, without long-term current use of insulin (HCC)   Relevant Medications   metFORMIN (GLUCOPHAGE) 500 MG tablet   glipiZIDE-metformin (METAGLIP) 5-500 MG tablet   lisinopril (PRINIVIL,ZESTRIL) 5 MG tablet   Other Relevant Orders   Hemoglobin A1c   Renal Function Panel      Meds ordered this encounter  Medications  . metFORMIN (GLUCOPHAGE) 500 MG tablet    Sig: Take 1 tablet (500 mg total) by mouth at bedtime.     Dispense:  90 tablet    Refill:  1  . glipiZIDE-metformin (METAGLIP) 5-500 MG tablet    Sig: Take 1 tablet by mouth 2 (two) times daily.    Dispense:  180 tablet    Refill:  1    Must keep appt  . lisinopril (PRINIVIL,ZESTRIL) 5 MG tablet    Sig: Take 1 tablet (5 mg total) by mouth every morning.    Dispense:  90 tablet    Refill:  1      Dr. Otilio Miu [facility]  09/07/16

## 2016-09-08 LAB — RENAL FUNCTION PANEL
Albumin: 4.5 g/dL (ref 3.5–5.5)
BUN / CREAT RATIO: 12 (ref 9–20)
BUN: 11 mg/dL (ref 6–24)
CALCIUM: 9.9 mg/dL (ref 8.7–10.2)
CHLORIDE: 97 mmol/L (ref 96–106)
CO2: 23 mmol/L (ref 18–29)
Creatinine, Ser: 0.9 mg/dL (ref 0.76–1.27)
GFR calc non Af Amer: 99 mL/min/{1.73_m2} (ref >59)
GFR, EST AFRICAN AMERICAN: 115 mL/min/{1.73_m2} (ref >59)
GLUCOSE: 134 mg/dL — AB (ref 65–99)
POTASSIUM: 4.1 mmol/L (ref 3.5–5.2)
Phosphorus: 3.7 mg/dL (ref 2.5–4.5)
SODIUM: 138 mmol/L (ref 134–144)

## 2016-09-08 LAB — HEMOGLOBIN A1C
ESTIMATED AVERAGE GLUCOSE: 148 mg/dL
HEMOGLOBIN A1C: 6.8 % — AB (ref 4.8–5.6)

## 2016-10-09 ENCOUNTER — Other Ambulatory Visit: Payer: Self-pay

## 2016-10-18 ENCOUNTER — Encounter: Payer: Self-pay | Admitting: Family Medicine

## 2016-10-18 ENCOUNTER — Ambulatory Visit (INDEPENDENT_AMBULATORY_CARE_PROVIDER_SITE_OTHER): Payer: BLUE CROSS/BLUE SHIELD | Admitting: Family Medicine

## 2016-10-18 VITALS — BP 110/64 | HR 80 | Ht 65.0 in | Wt 195.4 lb

## 2016-10-18 DIAGNOSIS — M5136 Other intervertebral disc degeneration, lumbar region: Secondary | ICD-10-CM | POA: Diagnosis not present

## 2016-10-18 MED ORDER — TRAMADOL HCL 50 MG PO TABS: 50.0000 mg | ORAL_TABLET | Freq: Three times a day (TID) | ORAL | 0 refills | Status: DC | PRN

## 2016-10-18 MED ORDER — CYCLOBENZAPRINE HCL 10 MG PO TABS: 10.0000 mg | ORAL_TABLET | Freq: Three times a day (TID) | ORAL | 0 refills | Status: DC | PRN

## 2016-10-18 MED ORDER — ETODOLAC 500 MG PO TABS: 500.0000 mg | ORAL_TABLET | Freq: Two times a day (BID) | ORAL | 1 refills | Status: DC

## 2016-10-18 MED ORDER — PREDNISONE 10 MG PO TABS: 10.0000 mg | ORAL_TABLET | Freq: Every day | ORAL | 0 refills | Status: DC

## 2016-10-18 NOTE — Progress Notes (Signed)
Name: Kevin Montgomery   MRN: [id number]    DOB: Apr 07, 1966   Date:10/18/2016       Progress Note  Subjective  Chief Complaint  Chief Complaint  Patient presents with  . Back Pain    Was lifting boxes Monday and feels like pulled muscles in lower back. Having difficulty sitting down and standing.     Back Pain  This is a chronic problem. The current episode started in the past 7 days. The problem occurs daily. The problem has been gradually worsening since onset. The pain is present in the lumbar spine. The quality of the pain is described as aching. The pain is at a severity of 6/10. The pain is moderate. The symptoms are aggravated by standing, sitting and twisting. Pertinent negatives include no abdominal pain, bladder incontinence, bowel incontinence, chest pain, dysuria, fever, headaches, leg pain, numbness, paresis, paresthesias, tingling, weakness or weight loss. Risk factors: hx of back pain.    No problem-specific Assessment & Plan notes found for this encounter.   Past Medical History:  Diagnosis Date  . Arthritis   . Chronic kidney disease    h/o kidney stones  . Complication of anesthesia   . Diabetes mellitus without complication ([place])   . GERD (gastroesophageal reflux disease)    no meds  . Hypertension    pt denies and states that Lisinopril is for kidney protection only from diabetes  . PONV (postoperative nausea and vomiting)     Past Surgical History:  Procedure Laterality Date  . KIDNEY STONE SURGERY    . LITHOTRIPSY    . UMBILICAL HERNIA REPAIR N/A 12/20/2015   Procedure: HERNIA REPAIR UMBILICAL ADULT;  Surgeon: Hubbard Robinson, MD;  Location: [facility] ORS;  Service: General;  Laterality: N/A;    Family History  Problem Relation Age of Onset  . Hyperlipidemia Mother   . Cancer Father     Pancreatic  . Diabetes Father   . Cancer Paternal Uncle   . Cancer Paternal Grandmother     Social History   Social History  . Marital status: Married    Spouse  name: N/A  . Number of children: N/A  . Years of education: N/A   Occupational History  . Not on file.   Social History Main Topics  . Smoking status: Never Smoker  . Smokeless tobacco: Never Used  . Alcohol use 0.0 oz/week     Comment: rare  . Drug use: No  . Sexual activity: Yes   Other Topics Concern  . Not on file   Social History Narrative  . No narrative on file    No Known Allergies  Outpatient Medications Prior to Visit  Medication Sig Dispense Refill  . aspirin 81 MG tablet Take 1 tablet (81 mg total) by mouth daily. 30 tablet 11  . Cinnamon 500 MG capsule Take 1,000 mg by mouth daily.    Marland Kitchen glipiZIDE-metformin (METAGLIP) 5-500 MG tablet Take 1 tablet by mouth 2 (two) times daily. 180 tablet 1  . lisinopril (PRINIVIL,ZESTRIL) 5 MG tablet Take 1 tablet (5 mg total) by mouth every morning. 90 tablet 1  . metFORMIN (GLUCOPHAGE) 500 MG tablet Take 1 tablet (500 mg total) by mouth at bedtime. 90 tablet 1   No facility-administered medications prior to visit.     Review of Systems  Constitutional: Negative for chills, fever, malaise/fatigue and weight loss.  HENT: Negative for ear discharge, ear pain and sore throat.   Eyes: Negative for blurred vision.  Respiratory: Negative for cough, sputum production, shortness of breath and wheezing.   Cardiovascular: Negative for chest pain, palpitations and leg swelling.  Gastrointestinal: Negative for abdominal pain, blood in stool, bowel incontinence, constipation, diarrhea, heartburn, melena and nausea.  Genitourinary: Negative for bladder incontinence, dysuria, frequency, hematuria and urgency.  Musculoskeletal: Positive for back pain. Negative for joint pain, myalgias and neck pain.  Skin: Negative for rash.  Neurological: Negative for dizziness, tingling, sensory change, focal weakness, weakness, numbness, headaches and paresthesias.  Endo/Heme/Allergies: Negative for environmental allergies and polydipsia. Does not  bruise/bleed easily.  Psychiatric/Behavioral: Negative for depression and suicidal ideas. The patient is not nervous/anxious and does not have insomnia.      Objective  Vitals:   10/18/16 1128  BP: 110/64  Pulse: 80  Weight: 195 lb 6.4 oz (88.6 kg)  Height: 5\' 5"  (1.651 m)    Physical Exam  Constitutional: He is oriented to person, place, and time and well-developed, well-nourished, and in no distress.  HENT:  Head: Normocephalic.  Right Ear: External ear normal.  Left Ear: External ear normal.  Nose: Nose normal.  Mouth/Throat: Oropharynx is clear and moist.  Eyes: Conjunctivae and EOM are normal. Pupils are equal, round, and reactive to light. Right eye exhibits no discharge. Left eye exhibits no discharge. No scleral icterus.  Neck: Normal range of motion. Neck supple. No JVD present. No tracheal deviation present. No thyromegaly present.  Cardiovascular: Normal rate, regular rhythm, normal heart sounds and intact distal pulses.  Exam reveals no gallop and no friction rub.   No murmur heard. Pulmonary/Chest: Breath sounds normal. No respiratory distress. He has no wheezes. He has no rales.  Abdominal: Soft. Bowel sounds are normal. He exhibits no mass. There is no hepatosplenomegaly. There is no tenderness. There is no rebound, no guarding and no CVA tenderness.  Musculoskeletal: Normal range of motion. He exhibits no edema or tenderness.  Lymphadenopathy:    He has no cervical adenopathy.  Neurological: He is alert and oriented to person, place, and time. He has normal sensation, normal strength, normal reflexes and intact cranial nerves. No cranial nerve deficit. He has a normal Straight Leg Raise Test.  Skin: Skin is warm. No rash noted.  Psychiatric: Mood and affect normal.  Nursing note and vitals reviewed.     Assessment & Plan  Problem List Items Addressed This Visit    None    Visit Diagnoses    DDD (degenerative disc disease), lumbar    -  Primary   Relevant  Medications   etodolac (LODINE) 500 MG tablet   predniSONE (DELTASONE) 10 MG tablet   cyclobenzaprine (FLEXERIL) 10 MG tablet   traMADol (ULTRAM) 50 MG tablet      Meds ordered this encounter  Medications  . etodolac (LODINE) 500 MG tablet    Sig: Take 1 tablet (500 mg total) by mouth 2 (two) times daily.    Dispense:  60 tablet    Refill:  1  . predniSONE (DELTASONE) 10 MG tablet    Sig: Take 1 tablet (10 mg total) by mouth daily with breakfast.    Dispense:  30 tablet    Refill:  0  . cyclobenzaprine (FLEXERIL) 10 MG tablet    Sig: Take 1 tablet (10 mg total) by mouth 3 (three) times daily as needed for muscle spasms.    Dispense:  30 tablet    Refill:  0  . traMADol (ULTRAM) 50 MG tablet    Sig: Take 1 tablet (50  mg total) by mouth every 8 (eight) hours as needed.    Dispense:  30 tablet    Refill:  0      Dr. Deanna Jones [facility]  10/18/16

## 2016-12-11 ENCOUNTER — Other Ambulatory Visit: Payer: Self-pay | Admitting: Family Medicine

## 2017-01-30 ENCOUNTER — Encounter: Payer: Self-pay | Admitting: Family Medicine

## 2017-01-30 ENCOUNTER — Ambulatory Visit (INDEPENDENT_AMBULATORY_CARE_PROVIDER_SITE_OTHER): Payer: BLUE CROSS/BLUE SHIELD | Admitting: Family Medicine

## 2017-01-30 ENCOUNTER — Ambulatory Visit
Admission: RE | Admit: 2017-01-30 | Discharge: 2017-01-30 | Disposition: A | Discharge status: Home / Self Care | Payer: BLUE CROSS/BLUE SHIELD | Source: Ambulatory Visit | Attending: Family Medicine | Admitting: Family Medicine

## 2017-01-30 VITALS — BP 110/62 | HR 80 | Ht 65.0 in | Wt 190.0 lb

## 2017-01-30 DIAGNOSIS — M228X2 Other disorders of patella, left knee: Secondary | ICD-10-CM | POA: Diagnosis not present

## 2017-01-30 DIAGNOSIS — M7632 Iliotibial band syndrome, left leg: Secondary | ICD-10-CM

## 2017-01-30 DIAGNOSIS — M24859 Other specific joint derangements of unspecified hip, not elsewhere classified: Secondary | ICD-10-CM

## 2017-01-30 DIAGNOSIS — M7072 Other bursitis of hip, left hip: Secondary | ICD-10-CM

## 2017-01-30 DIAGNOSIS — M5117 Intervertebral disc disorders with radiculopathy, lumbosacral region: Secondary | ICD-10-CM | POA: Diagnosis not present

## 2017-01-30 DIAGNOSIS — M5418 Radiculopathy, sacral and sacrococcygeal region: Secondary | ICD-10-CM

## 2017-01-30 MED ORDER — MELOXICAM 15 MG PO TABS: 15.0000 mg | ORAL_TABLET | Freq: Every day | ORAL | 0 refills | Status: DC

## 2017-01-30 MED ORDER — CYCLOBENZAPRINE HCL 10 MG PO TABS: 10.0000 mg | ORAL_TABLET | Freq: Three times a day (TID) | ORAL | 0 refills | Status: DC | PRN

## 2017-01-30 MED ORDER — PREDNISONE 10 MG PO TABS: 10.0000 mg | ORAL_TABLET | Freq: Every day | ORAL | 0 refills | Status: DC

## 2017-01-30 NOTE — Patient Instructions (Signed)
Snapping Hip Syndrome Snapping hip syndrome is a condition that causes a "snapping" or "popping" feeling in your hip, especially when you walk, stand up from a chair, or swing your leg. Strong bands of tissue (tendons) attach the muscles in your buttocks, thighs, and pelvis to the bones of your hip. Snapping hip syndrome typically happens when a muscle or tendon moves across a bony part of your hip. Snapping hip can also involve torn or loose structures within the joint. This is less common. Snapping hip syndrome can affect different areas of your hip, including the front, side, or back. What are the causes? This condition is typically caused by tight tendons or muscles around the hip. This often happens from overuse. What increases the risk? The following factors may make you more likely to develop this condition:  Being younger than age 30.  Being a Tourist information centre manager, runner, weight lifter, gymnast, or soccer player.  Having had an injury to your hip or pelvis.  Having an abnormally shaped pelvis or leg (deformity).  What are the signs or symptoms? Symptoms of this condition include:  A "snapping" or "popping" sensation in the front, side, or back of the hip when moving your leg. This can cause pain. The pain typically goes away when you stop moving.  Tightness in the hip.  Swelling in the front or side of the hip.  Leg weakness, especially when trying to lift it up or sideways.  Difficulty getting out of low chairs.  How is this diagnosed? This condition is diagnosed based on your symptoms, medical history, and physical exam. Your health care provider may have you move your leg into certain positions and test your muscle strength. You may have an X-ray, ultrasound, or MRI to rule out other causes of pain. You may also have an injection of numbing medicine to see if that reduces your symptoms. How is this treated? Treatment for this condition includes:  Stopping all activities that cause  pain or make your condition worse.  Icing your hip to relieve pain.  Taking NSAIDs or getting corticosteroid injections to reduce pain and swelling.  Doing range-of-motion and strengthening exercises (physical therapy) as told by your health care provider.  You may need surgery to loosen your muscle and tendon or to remove any loose pieces of cartilage if other treatments do not work. Follow these instructions at home:  Take over-the-counter and prescription medicines only as told by your health care provider.  If directed, apply ice to the injured area. ? Put ice in a plastic bag. ? Place a towel between your skin and the bag. ? Leave the ice on for 20 minutes, 2-3 times a day.  Do physical therapy as told by your health care provider.  Return to your normal activities as told by your health care provider. Ask your health care provider what activities are safe for you.  Keep all follow-up visits as told by your health care provider. This is important. How is this prevented?  Warm up and stretch before being active.  Cool down and stretch after being active.  Give your body time to rest between periods of activity.  Maintain physical fitness, including: ? Strength. ? Flexibility. Contact a health care provider if:  You have pain, swelling, and difficulty moving that gets worse.  Your leg or hip feels weak and feels like it cannot hold you up.  You cannot stand or walk without severe pain. This information is not intended to replace advice given to  you by your health care provider. Make sure you discuss any questions you have with your health care provider. Document Released: 05/29/2005 Document Revised: 02/01/2016 Document Reviewed: 05/11/2015 [employer]vier Interactive Patient Education  2018 [employer]  

## 2017-01-30 NOTE — Progress Notes (Signed)
Name: Kevin Montgomery   MRN: [id number]    DOB: 12/21/1965   Date:01/30/2017       Progress Note  Subjective  Chief Complaint  Chief Complaint  Patient presents with  . Back Pain    L) side lower back (doesn't really hurt )- the actual pain runs down the L) hip/leg into the back of the knee. Been going on x 2 weeks- gets worse at night when trying to sleep. Walking makes it worse    Back Pain  This is a new problem. The current episode started 1 to 4 weeks ago (2-3 weeks ). The problem occurs intermittently. The problem has been gradually worsening since onset. The quality of the pain is described as aching. The pain radiates to the left knee and left thigh. The pain is at a severity of 9/10. The pain is moderate. The symptoms are aggravated by standing. Stiffness is present at night. Associated symptoms include leg pain, paresis and weakness. Pertinent negatives include no abdominal pain, bladder incontinence, bowel incontinence, chest pain, dysuria, fever, headaches, numbness, paresthesias, pelvic pain, perianal numbness, tingling or weight loss. He has tried chiropractic manipulation and NSAIDs (gabapentin) for the symptoms. The treatment provided no relief.  Leg Pain   The incident occurred more than 1 week ago. There was no injury mechanism. The pain is present in the left hip and left knee. The quality of the pain is described as aching. The pain is at a severity of 9/10. The pain has been constant since onset. Associated symptoms include muscle weakness. Pertinent negatives include no loss of motion, numbness or tingling. The symptoms are aggravated by weight bearing. The treatment provided no relief.    No problem-specific Assessment & Plan notes found for this encounter.   Past Medical History:  Diagnosis Date  . Arthritis   . Chronic kidney disease    h/o kidney stones  . Complication of anesthesia   . Diabetes mellitus without complication ([place])   . GERD (gastroesophageal reflux  disease)    no meds  . Hypertension    pt denies and states that Lisinopril is for kidney protection only from diabetes  . PONV (postoperative nausea and vomiting)     Past Surgical History:  Procedure Laterality Date  . KIDNEY STONE SURGERY    . LITHOTRIPSY    . UMBILICAL HERNIA REPAIR N/A 12/20/2015   Procedure: HERNIA REPAIR UMBILICAL ADULT;  Surgeon: Hubbard Robinson, MD;  Location: [facility] ORS;  Service: General;  Laterality: N/A;    Family History  Problem Relation Age of Onset  . Hyperlipidemia Mother   . Cancer Father        Pancreatic  . Diabetes Father   . Cancer Paternal Uncle   . Cancer Paternal Grandmother     Social History   Social History  . Marital status: Married    Spouse name: N/A  . Number of children: N/A  . Years of education: N/A   Occupational History  . Not on file.   Social History Main Topics  . Smoking status: Never Smoker  . Smokeless tobacco: Never Used  . Alcohol use 0.0 oz/week     Comment: rare  . Drug use: No  . Sexual activity: Yes   Other Topics Concern  . Not on file   Social History Narrative  . No narrative on file    No Known Allergies  Outpatient Medications Prior to Visit  Medication Sig Dispense Refill  . aspirin 81 MG tablet  Take 1 tablet (81 mg total) by mouth daily. 30 tablet 11  . Cinnamon 500 MG capsule Take 1,000 mg by mouth daily.    . cyclobenzaprine (FLEXERIL) 10 MG tablet Take 1 tablet (10 mg total) by mouth 3 (three) times daily as needed for muscle spasms. 30 tablet 0  . etodolac (LODINE) 500 MG tablet Take 1 tablet (500 mg total) by mouth 2 (two) times daily. 60 tablet 1  . glipiZIDE-metformin (METAGLIP) 5-500 MG tablet Take 1 tablet by mouth 2 (two) times daily. 180 tablet 1  . lisinopril (PRINIVIL,ZESTRIL) 5 MG tablet Take 1 tablet (5 mg total) by mouth every morning. 90 tablet 1  . metFORMIN (GLUCOPHAGE) 500 MG tablet Take 1 tablet (500 mg total) by mouth at bedtime. 90 tablet 1  . metFORMIN  (GLUCOPHAGE) 500 MG tablet TAKE 1 TABLET TWICE A DAY WITH FOOD ALONG WITH 1 GLIPIZIDE/METFORMIN 5/500 180 tablet 0  . predniSONE (DELTASONE) 10 MG tablet Take 1 tablet (10 mg total) by mouth daily with breakfast. 30 tablet 0  . traMADol (ULTRAM) 50 MG tablet Take 1 tablet (50 mg total) by mouth every 8 (eight) hours as needed. 30 tablet 0   No facility-administered medications prior to visit.     Review of Systems  Constitutional: Negative for chills, fever, malaise/fatigue and weight loss.  HENT: Negative for ear discharge, ear pain and sore throat.   Eyes: Negative for blurred vision.  Respiratory: Negative for cough, sputum production, shortness of breath and wheezing.   Cardiovascular: Negative for chest pain, palpitations and leg swelling.  Gastrointestinal: Negative for abdominal pain, blood in stool, bowel incontinence, constipation, diarrhea, heartburn, melena and nausea.  Genitourinary: Negative for bladder incontinence, dysuria, frequency, hematuria, pelvic pain and urgency.  Musculoskeletal: Positive for back pain and myalgias. Negative for joint pain and neck pain.  Skin: Negative for rash.  Neurological: Positive for weakness. Negative for dizziness, tingling, sensory change, focal weakness, numbness, headaches and paresthesias.  Endo/Heme/Allergies: Negative for environmental allergies and polydipsia. Does not bruise/bleed easily.  Psychiatric/Behavioral: Negative for depression and suicidal ideas. The patient is not nervous/anxious and does not have insomnia.      Objective  Vitals:   01/30/17 0828  BP: 110/62  Pulse: 80  Weight: 190 lb (86.2 kg)  Height: 5\' 5"  (1.651 m)    Physical Exam  Constitutional: He is oriented to person, place, and time and well-developed, well-nourished, and in no distress.  HENT:  Head: Normocephalic.  Right Ear: External ear normal.  Left Ear: External ear normal.  Nose: Nose normal.  Mouth/Throat: Oropharynx is clear and moist.   Eyes: Pupils are equal, round, and reactive to light. Conjunctivae and EOM are normal. Right eye exhibits no discharge. Left eye exhibits no discharge. No scleral icterus.  Neck: Normal range of motion. Neck supple. No JVD present. No tracheal deviation present. No thyromegaly present.  Cardiovascular: Normal rate, regular rhythm, normal heart sounds and intact distal pulses.  Exam reveals no gallop and no friction rub.   No murmur heard. Pulmonary/Chest: Breath sounds normal. No respiratory distress. He has no wheezes. He has no rales.  Abdominal: Soft. Bowel sounds are normal. He exhibits no mass. There is no hepatosplenomegaly. There is no tenderness. There is no rebound, no guarding and no CVA tenderness.  Musculoskeletal: Normal range of motion. He exhibits no edema or tenderness.       Left hip: He exhibits normal range of motion and no crepitus.       Left  knee: No tenderness found. No medial joint line and no lateral joint line tenderness noted.       Lumbar back: He exhibits spasm.  Patellar crepitence/tenderness uschial bursa  Lymphadenopathy:    He has no cervical adenopathy.  Neurological: He is alert and oriented to person, place, and time. He has normal sensation, normal strength, normal reflexes and intact cranial nerves. No cranial nerve deficit. He has a normal Straight Leg Raise Test.  Skin: Skin is warm. No rash noted.  Psychiatric: Mood and affect normal.  Nursing note and vitals reviewed.     Assessment & Plan  Problem List Items Addressed This Visit    None    Visit Diagnoses    Radiculopathy of sacral region    -  Primary   Relevant Medications   cyclobenzaprine (FLEXERIL) 10 MG tablet   Other Relevant Orders   DG Lumbar Spine Complete (Completed)   Ambulatory referral to Orthopedic Surgery   Ischial bursitis of left side       Relevant Medications   meloxicam (MOBIC) 15 MG tablet   Other Relevant Orders   Ambulatory referral to Orthopedic Surgery    Iliotibial band syndrome of left side       Relevant Medications   meloxicam (MOBIC) 15 MG tablet   Other Relevant Orders   Ambulatory referral to Orthopedic Surgery   Patellar tracking disorder of left knee       Relevant Medications   meloxicam (MOBIC) 15 MG tablet   Other Relevant Orders   Ambulatory referral to Orthopedic Surgery      Meds ordered this encounter  Medications  . meloxicam (MOBIC) 15 MG tablet    Sig: Take 1 tablet (15 mg total) by mouth daily.    Dispense:  30 tablet    Refill:  0  . cyclobenzaprine (FLEXERIL) 10 MG tablet    Sig: Take 1 tablet (10 mg total) by mouth 3 (three) times daily as needed for muscle spasms.    Dispense:  30 tablet    Refill:  0  . predniSONE (DELTASONE) 10 MG tablet    Sig: Take 1 tablet (10 mg total) by mouth daily with breakfast.    Dispense:  30 tablet    Refill:  0    Taper 4,4,4,3,3,3,2,2,2,1,1,1      Dr. Otilio Miu [facility]  01/30/17

## 2017-03-15 ENCOUNTER — Other Ambulatory Visit: Payer: Self-pay | Admitting: Family Medicine

## 2017-03-15 DIAGNOSIS — I10 Essential (primary) hypertension: Secondary | ICD-10-CM

## 2017-03-30 ENCOUNTER — Other Ambulatory Visit: Payer: Self-pay | Admitting: Family Medicine

## 2017-04-04 ENCOUNTER — Other Ambulatory Visit: Payer: Self-pay

## 2017-04-04 MED ORDER — METFORMIN HCL 500 MG PO TABS: ORAL_TABLET | ORAL | 0 refills | Status: DC

## 2017-04-12 ENCOUNTER — Encounter: Payer: Self-pay | Admitting: Family Medicine

## 2017-04-12 ENCOUNTER — Ambulatory Visit (INDEPENDENT_AMBULATORY_CARE_PROVIDER_SITE_OTHER): Payer: BLUE CROSS/BLUE SHIELD | Admitting: Family Medicine

## 2017-04-12 VITALS — BP 130/80 | HR 88 | Ht 65.0 in | Wt 187.0 lb

## 2017-04-12 DIAGNOSIS — Z23 Encounter for immunization: Secondary | ICD-10-CM | POA: Diagnosis not present

## 2017-04-12 DIAGNOSIS — E119 Type 2 diabetes mellitus without complications: Secondary | ICD-10-CM

## 2017-04-12 DIAGNOSIS — I10 Essential (primary) hypertension: Secondary | ICD-10-CM | POA: Diagnosis not present

## 2017-04-12 MED ORDER — LISINOPRIL 5 MG PO TABS: 5.0000 mg | ORAL_TABLET | Freq: Every morning | ORAL | 0 refills | Status: DC

## 2017-04-12 MED ORDER — METFORMIN HCL 500 MG PO TABS: 500.0000 mg | ORAL_TABLET | Freq: Every day | ORAL | 1 refills | Status: DC

## 2017-04-12 MED ORDER — GLIPIZIDE-METFORMIN HCL 5-500 MG PO TABS: 1.0000 | ORAL_TABLET | Freq: Two times a day (BID) | ORAL | 1 refills | Status: DC

## 2017-04-12 NOTE — Progress Notes (Signed)
Name: Kevin Montgomery   MRN: [id number]    DOB: 1966-03-31   Date:04/12/2017       Progress Note  Subjective  Chief Complaint  Chief Complaint  Patient presents with  . Diabetes  . Hypertension    Diabetes  He presents for his follow-up diabetic visit. He has type 2 diabetes mellitus. His disease course has been stable. There are no hypoglycemic associated symptoms. Pertinent negatives for hypoglycemia include no dizziness, headaches or nervousness/anxiousness. Pertinent negatives for diabetes include no blurred vision, no chest pain, no fatigue, no foot paresthesias, no foot ulcerations, no polydipsia, no polyphagia, no polyuria, no visual change, no weakness and no weight loss. There are no hypoglycemic complications. Symptoms are stable. There are no diabetic complications. Risk factors for coronary artery disease include diabetes mellitus and dyslipidemia. Current diabetic treatment includes oral agent (dual therapy). He is compliant with treatment all of the time. His weight is stable. He is following a generally healthy diet. There is no change in his home blood glucose trend. An ACE inhibitor/angiotensin II receptor blocker is being taken. He does not see a podiatrist.Eye exam is not current.  Hypertension  This is a chronic problem. The current episode started more than 1 year ago. The problem is unchanged. The problem is controlled. Pertinent negatives include no blurred vision, chest pain, headaches, malaise/fatigue, neck pain, palpitations or shortness of breath. Past treatments include ACE inhibitors.    No problem-specific Assessment & Plan notes found for this encounter.   Past Medical History:  Diagnosis Date  . Arthritis   . Chronic kidney disease    h/o kidney stones  . Complication of anesthesia   . Diabetes mellitus without complication ([place])   . GERD (gastroesophageal reflux disease)    no meds  . Hypertension    pt denies and states that Lisinopril is for kidney  protection only from diabetes  . PONV (postoperative nausea and vomiting)     Past Surgical History:  Procedure Laterality Date  . KIDNEY STONE SURGERY    . LITHOTRIPSY    . UMBILICAL HERNIA REPAIR N/A 12/20/2015   Procedure: HERNIA REPAIR UMBILICAL ADULT;  Surgeon: Hubbard Robinson, MD;  Location: [facility] ORS;  Service: General;  Laterality: N/A;    Family History  Problem Relation Age of Onset  . Hyperlipidemia Mother   . Cancer Father        Pancreatic  . Diabetes Father   . Cancer Paternal Uncle   . Cancer Paternal Grandmother     Social History   Social History  . Marital status: Married    Spouse name: N/A  . Number of children: N/A  . Years of education: N/A   Occupational History  . Not on file.   Social History Main Topics  . Smoking status: Never Smoker  . Smokeless tobacco: Never Used  . Alcohol use 0.0 oz/week     Comment: rare  . Drug use: No  . Sexual activity: Yes   Other Topics Concern  . Not on file   Social History Narrative  . No narrative on file    No Known Allergies  Outpatient Medications Prior to Visit  Medication Sig Dispense Refill  . aspirin 81 MG tablet Take 1 tablet (81 mg total) by mouth daily. 30 tablet 11  . Cinnamon 500 MG capsule Take 1,000 mg by mouth daily.    . meloxicam (MOBIC) 15 MG tablet Take 1 tablet (15 mg total) by mouth daily. (Patient taking differently:  Take 15 mg by mouth daily. Reche Dixon) 30 tablet 0  . traMADol (ULTRAM) 50 MG tablet Take 1 tablet (50 mg total) by mouth every 8 (eight) hours as needed. (Patient taking differently: Take 50 mg by mouth every 8 (eight) hours as needed. Reche [place]) 30 tablet 0  . cyclobenzaprine (FLEXERIL) 10 MG tablet Take 1 tablet (10 mg total) by mouth 3 (three) times daily as needed for muscle spasms. 30 tablet 0  . glipiZIDE-metformin (METAGLIP) 5-500 MG tablet Take 1 tablet by mouth 2 (two) times daily. 180 tablet 1  . lisinopril (PRINIVIL,ZESTRIL) 5 MG tablet TAKE ONE TABLET  EVERY MORNING 90 tablet 0  . metFORMIN (GLUCOPHAGE) 500 MG tablet Take 1 tablet (500 mg total) by mouth at bedtime. 90 tablet 1  . metFORMIN (GLUCOPHAGE) 500 MG tablet TAKE (1) TABLET BY MOUTH TWICE DAILY WITH FOOD ALONG WITH 1 GLIPIZIDE/METFORM5/500 60 tablet 0  . cyclobenzaprine (FLEXERIL) 10 MG tablet Take 1 tablet (10 mg total) by mouth 3 (three) times daily as needed for muscle spasms. 30 tablet 0  . etodolac (LODINE) 500 MG tablet Take 1 tablet (500 mg total) by mouth 2 (two) times daily. 60 tablet 1  . predniSONE (DELTASONE) 10 MG tablet Take 1 tablet (10 mg total) by mouth daily with breakfast. 30 tablet 0  . predniSONE (DELTASONE) 10 MG tablet Take 1 tablet (10 mg total) by mouth daily with breakfast. 30 tablet 0   No facility-administered medications prior to visit.     Review of Systems  Constitutional: Negative for chills, fatigue, fever, malaise/fatigue and weight loss.  HENT: Negative for ear discharge, ear pain and sore throat.   Eyes: Negative for blurred vision.  Respiratory: Negative for cough, sputum production, shortness of breath and wheezing.   Cardiovascular: Negative for chest pain, palpitations and leg swelling.  Gastrointestinal: Negative for abdominal pain, blood in stool, constipation, diarrhea, heartburn, melena and nausea.  Genitourinary: Negative for dysuria, frequency, hematuria and urgency.  Musculoskeletal: Negative for back pain, joint pain, myalgias and neck pain.  Skin: Negative for rash.  Neurological: Negative for dizziness, tingling, sensory change, focal weakness, weakness and headaches.  Endo/Heme/Allergies: Negative for environmental allergies, polydipsia and polyphagia. Does not bruise/bleed easily.  Psychiatric/Behavioral: Negative for depression and suicidal ideas. The patient is not nervous/anxious and does not have insomnia.      Objective  Vitals:   04/12/17 0908  BP: 130/80  Pulse: 88  Weight: 187 lb (84.8 kg)  Height: 5\' 5"  (1.651  m)    Physical Exam  Constitutional: He is oriented to person, place, and time and well-developed, well-nourished, and in no distress.  Cardiovascular: Normal rate.   Pulmonary/Chest: Effort normal and breath sounds normal.  Musculoskeletal: Normal range of motion.  Neurological: He is alert and oriented to person, place, and time.  Skin: Skin is warm and dry.  Psychiatric: Affect normal.  Nursing note and vitals reviewed.     Assessment & Plan  Problem List Items Addressed This Visit      Cardiovascular and Mediastinum   Essential hypertension - Primary   Relevant Medications   lisinopril (PRINIVIL,ZESTRIL) 5 MG tablet   Other Relevant Orders   Renal Function Panel     Endocrine   Type 2 diabetes mellitus without complication, without long-term current use of insulin ([facility])   Relevant Medications   metFORMIN (GLUCOPHAGE) 500 MG tablet   glipiZIDE-metformin (METAGLIP) 5-500 MG tablet   lisinopril (PRINIVIL,ZESTRIL) 5 MG tablet   Other Relevant Orders  Hemoglobin A1c   Lipid Profile    Other Visit Diagnoses    Need for diphtheria-tetanus-pertussis (Tdap) vaccine          Meds ordered this encounter  Medications  . metFORMIN (GLUCOPHAGE) 500 MG tablet    Sig: Take 1 tablet (500 mg total) by mouth at bedtime.    Dispense:  90 tablet    Refill:  1  . glipiZIDE-metformin (METAGLIP) 5-500 MG tablet    Sig: Take 1 tablet by mouth 2 (two) times daily.    Dispense:  180 tablet    Refill:  1    Must keep appt  . lisinopril (PRINIVIL,ZESTRIL) 5 MG tablet    Sig: Take 1 tablet (5 mg total) by mouth every morning.    Dispense:  90 tablet    Refill:  0      Dr. Otilio Miu [facility]  04/12/17

## 2017-04-13 LAB — LIPID PANEL
CHOLESTEROL TOTAL: 141 mg/dL (ref 100–199)
Chol/HDL Ratio: 3.7 ratio (ref 0.0–5.0)
HDL: 38 mg/dL — AB (ref >39)
LDL CALC: 83 mg/dL (ref 0–99)
TRIGLYCERIDES: 101 mg/dL (ref 0–149)
VLDL CHOLESTEROL CAL: 20 mg/dL (ref 5–40)

## 2017-04-13 LAB — RENAL FUNCTION PANEL
ALBUMIN: 4.9 g/dL (ref 3.5–5.5)
BUN/Creatinine Ratio: 17 (ref 9–20)
BUN: 15 mg/dL (ref 6–24)
CHLORIDE: 99 mmol/L (ref 96–106)
CO2: 22 mmol/L (ref 20–29)
Calcium: 10.1 mg/dL (ref 8.7–10.2)
Creatinine, Ser: 0.89 mg/dL (ref 0.76–1.27)
GFR calc Af Amer: 114 mL/min/{1.73_m2} (ref >59)
GFR calc non Af Amer: 99 mL/min/{1.73_m2} (ref >59)
Glucose: 105 mg/dL — ABNORMAL HIGH (ref 65–99)
PHOSPHORUS: 2.5 mg/dL (ref 2.5–4.5)
Potassium: 4.3 mmol/L (ref 3.5–5.2)
Sodium: 139 mmol/L (ref 134–144)

## 2017-04-13 LAB — HEMOGLOBIN A1C
Est. average glucose Bld gHb Est-mCnc: 146 mg/dL
HEMOGLOBIN A1C: 6.7 % — AB (ref 4.8–5.6)

## 2017-07-07 ENCOUNTER — Other Ambulatory Visit: Payer: Self-pay

## 2017-07-07 ENCOUNTER — Encounter: Payer: Self-pay | Admitting: Gynecology

## 2017-07-07 ENCOUNTER — Ambulatory Visit
Admission: EM | Admit: 2017-07-07 | Discharge: 2017-07-07 | Disposition: A | Discharge status: Home / Self Care | Payer: BLUE CROSS/BLUE SHIELD | Attending: Family Medicine | Admitting: Family Medicine

## 2017-07-07 DIAGNOSIS — J020 Streptococcal pharyngitis: Secondary | ICD-10-CM | POA: Diagnosis not present

## 2017-07-07 DIAGNOSIS — R51 Headache: Secondary | ICD-10-CM | POA: Diagnosis not present

## 2017-07-07 LAB — RAPID STREP SCREEN (MED CTR MEBANE ONLY): Streptococcus, Group A Screen (Direct): POSITIVE — AB

## 2017-07-07 MED ORDER — LIDOCAINE VISCOUS 2 % MT SOLN: 10.0000 mL | OROMUCOSAL | 0 refills | Status: DC | PRN

## 2017-07-07 MED ORDER — PENICILLIN V POTASSIUM 500 MG PO TABS: 500.0000 mg | ORAL_TABLET | Freq: Two times a day (BID) | ORAL | 0 refills | Status: AC

## 2017-07-07 NOTE — ED Triage Notes (Signed)
Patient c/o sore throat and headache x couple days

## 2017-07-07 NOTE — ED Provider Notes (Signed)
[facility]    CSN: [id number] Arrival date & time: 07/07/17  0859     History   Chief Complaint Chief Complaint  Patient presents with  . Sore Throat    HPI Kevin Montgomery is a 52 y.o. male.   52 year old male presents with sore throat and headache for the past 2 days. Also having slight right sided sinus pressure. Denies any fever, cough or GI symptoms. Has taken OTC cold and flu medication and cough drops with minimal relief. No other family members ill. Has history of HTN and DM- takes Lisinopril, Metformin and aspirin daily.    The history is provided by the patient.    Past Medical History:  Diagnosis Date  . Arthritis   . Chronic kidney disease    h/o kidney stones  . Complication of anesthesia   . Diabetes mellitus without complication ([place])   . GERD (gastroesophageal reflux disease)    no meds  . Hypertension    pt denies and states that Lisinopril is for kidney protection only from diabetes  . PONV (postoperative nausea and vomiting)     Patient Active Problem List   Diagnosis Date Noted  . Incarcerated umbilical hernia   . Umbilical hernia 62/95/2841  . Diastasis recti 10/08/2015  . Essential hypertension 04/21/2015  . Type 2 diabetes mellitus without complication, without long-term current use of insulin ([place]) 04/21/2015  . Hyperlipidemia 04/21/2015    Past Surgical History:  Procedure Laterality Date  . KIDNEY STONE SURGERY    . LITHOTRIPSY    . UMBILICAL HERNIA REPAIR N/A 12/20/2015   Procedure: HERNIA REPAIR UMBILICAL ADULT;  Surgeon: Hubbard Robinson, MD;  Location: [facility] ORS;  Service: General;  Laterality: N/A;       Home Medications    Prior to Admission medications   Medication Sig Start Date End Date Taking? Authorizing Provider  aspirin 81 MG tablet Take 1 tablet (81 mg total) by mouth daily. 04/21/15  Yes Juline Patch, MD  Cinnamon 500 MG capsule Take 1,000 mg by mouth daily.   Yes [provider]    glipiZIDE-metformin (METAGLIP) 5-500 MG tablet Take 1 tablet by mouth 2 (two) times daily. 04/12/17  Yes Juline Patch, MD  lisinopril (PRINIVIL,ZESTRIL) 5 MG tablet Take 1 tablet (5 mg total) by mouth every morning. 04/12/17  Yes Juline Patch, MD  metFORMIN (GLUCOPHAGE) 500 MG tablet Take 1 tablet (500 mg total) by mouth at bedtime. 04/12/17  Yes Juline Patch, MD  lidocaine (XYLOCAINE) 2 % solution Use as directed 10 mLs in the mouth or throat every 4 (four) hours as needed for mouth pain. 07/07/17   Katy Apo, NP  penicillin v potassium (VEETID) 500 MG tablet Take 1 tablet (500 mg total) by mouth 2 times daily at 12 noon and 4 pm for 10 days. 07/07/17 07/17/17  Katy Apo, NP    Family History Family History  Problem Relation Age of Onset  . Hyperlipidemia Mother   . Cancer Father        Pancreatic  . Diabetes Father   . Cancer Paternal Uncle   . Cancer Paternal Grandmother     Social History Social History   Tobacco Use  . Smoking status: Never Smoker  . Smokeless tobacco: Never Used  Substance Use Topics  . Alcohol use: Yes    Alcohol/week: 0.0 oz    Comment: rare  . Drug use: No     Allergies  Patient has no known allergies.   Review of Systems Review of Systems  Constitutional: Positive for fatigue. Negative for activity change, appetite change, chills and fever.  HENT: Positive for sinus pressure, sore throat and trouble swallowing. Negative for congestion, ear discharge, ear pain, facial swelling, mouth sores, nosebleeds, postnasal drip, rhinorrhea, sinus pain and sneezing.   Eyes: Negative for pain, discharge, redness and itching.  Respiratory: Negative for cough, chest tightness, shortness of breath and wheezing.   Gastrointestinal: Negative for abdominal pain, diarrhea, nausea and vomiting.  Musculoskeletal: Negative for arthralgias, myalgias, neck pain and neck stiffness.  Skin: Negative for rash and wound.  Neurological: Positive for  headaches. Negative for dizziness, tremors, seizures, syncope, speech difficulty, weakness, light-headedness and numbness.  Hematological: Positive for adenopathy. Does not bruise/bleed easily.     Physical Exam Triage Vital Signs ED Triage Vitals  Enc Vitals Group     BP 07/07/17 0941 121/76     Pulse Rate 07/07/17 0941 78     Resp 07/07/17 0941 16     Temp 07/07/17 0941 98.1 F (36.7 C)     Temp Source 07/07/17 0941 Oral     SpO2 07/07/17 0941 99 %     Weight 07/07/17 0943 185 lb (83.9 kg)     Height 07/07/17 0943 5\' 6"  (1.676 m)     Head Circumference --      Peak Flow --      Pain Score 07/07/17 0943 6     Pain Loc --      Pain Edu? --      Excl. in [place]? --    No data found.  Updated Vital Signs BP 121/76 (BP Location: Left Arm)   Pulse 78   Temp 98.1 F (36.7 C) (Oral)   Resp 16   Ht 5\' 6"  (1.676 m)   Wt 185 lb (83.9 kg)   SpO2 99%   BMI 29.86 kg/m   Visual Acuity Right Eye Distance:   Left Eye Distance:   Bilateral Distance:    Right Eye Near:   Left Eye Near:    Bilateral Near:     Physical Exam  Constitutional: He is oriented to person, place, and time. He appears well-developed and well-nourished. He appears ill. No distress.  HENT:  Head: Normocephalic and atraumatic.  Right Ear: Hearing, tympanic membrane, external ear and ear canal normal.  Left Ear: Hearing, tympanic membrane, external ear and ear canal normal.  Nose: No mucosal edema or rhinorrhea. Right sinus exhibits maxillary sinus tenderness. Right sinus exhibits no frontal sinus tenderness. Left sinus exhibits no maxillary sinus tenderness and no frontal sinus tenderness.  Mouth/Throat: Uvula is midline and mucous membranes are normal. Posterior oropharyngeal edema and posterior oropharyngeal erythema present. No oropharyngeal exudate. No tonsillar exudate.  Eyes: Conjunctivae and EOM are normal.  Neck: Normal range of motion. Neck supple.  Cardiovascular: Normal rate, regular rhythm and  normal heart sounds.  No murmur heard. Pulmonary/Chest: Effort normal and breath sounds normal. No stridor. No respiratory distress. He has no decreased breath sounds. He has no wheezes. He has no rhonchi. He has no rales.  Musculoskeletal: Normal range of motion.  Lymphadenopathy:    He has cervical adenopathy.       Right cervical: Superficial cervical adenopathy present.       Left cervical: Superficial cervical adenopathy present.  Neurological: He is alert and oriented to person, place, and time.  Skin: Skin is warm and dry. Capillary refill takes less than 2  seconds.  Psychiatric: He has a normal mood and affect. His behavior is normal. Judgment and thought content normal.     UC Treatments / Results  Labs (all labs ordered are listed, but only abnormal results are displayed) Labs Reviewed  RAPID STREP SCREEN (NOT AT [facility]) - Abnormal; Notable for the following components:      Result Value   Streptococcus, Group A Screen (Direct) POSITIVE (*)    All other components within normal limits    EKG  EKG Interpretation None       Radiology No results found.  Procedures Procedures (including critical care time)  Medications Ordered in UC Medications - No data to display   Initial Impression / Assessment and Plan / UC Course  I have reviewed the triage vital signs and the nursing notes.  Pertinent labs & imaging results that were available during my care of the patient were reviewed by me and considered in my medical decision making (see chart for details).    Reviewed positive rapid strep test with patient. Recommend start Penicillin 500mg  twice a day as directed. May use Viscous lidocaine mouth wash- swish and spit out every 3 to 4 hours as needed for throat pain. May take Ibuprofen 600mg  every 6 hours as needed for pain. Rest. Follow-up in 3 days with his PCP if not improving.    Final Clinical Impressions(s) / UC Diagnoses   Final diagnoses:  Strep pharyngitis      ED Discharge Orders        Ordered    penicillin v potassium (VEETID) 500 MG tablet  2 times daily     07/07/17 1009    lidocaine (XYLOCAINE) 2 % solution  Every 4 hours PRN     07/07/17 1009       Controlled Substance Prescriptions [place] Controlled Substance Registry consulted? Not Applicable   Katy Apo, NP 07/07/17 1933

## 2017-07-07 NOTE — Discharge Instructions (Addendum)
Recommend start Penicillin 500mg  twice a day as directed. May use Viscous lidocaine mouth wash- swish and spit out every 3 to 4 hours as needed for throat pain. May take Ibuprofen 600mg  every 6 hours as needed for pain. Rest. Follow-up in 3 days with your PCP if not improving.

## 2017-07-10 ENCOUNTER — Telehealth: Payer: Self-pay | Admitting: *Deleted

## 2017-07-10 ENCOUNTER — Ambulatory Visit: Payer: BLUE CROSS/BLUE SHIELD | Admitting: Family Medicine

## 2017-07-10 ENCOUNTER — Encounter: Payer: Self-pay | Admitting: Family Medicine

## 2017-07-10 VITALS — BP 126/80 | HR 106 | Temp 98.2°F | Ht 66.0 in | Wt 192.0 lb

## 2017-07-10 DIAGNOSIS — E119 Type 2 diabetes mellitus without complications: Secondary | ICD-10-CM | POA: Diagnosis not present

## 2017-07-10 DIAGNOSIS — J0100 Acute maxillary sinusitis, unspecified: Secondary | ICD-10-CM

## 2017-07-10 DIAGNOSIS — J219 Acute bronchiolitis, unspecified: Secondary | ICD-10-CM

## 2017-07-10 MED ORDER — LEVOFLOXACIN 500 MG PO TABS: 500.0000 mg | ORAL_TABLET | Freq: Every day | ORAL | 0 refills | Status: DC

## 2017-07-10 MED ORDER — GUAIFENESIN-CODEINE 100-10 MG/5ML PO SYRP: 5.0000 mL | ORAL_SOLUTION | Freq: Three times a day (TID) | ORAL | 0 refills | Status: DC | PRN

## 2017-07-10 MED ORDER — PREDNISONE 10 MG PO TABS: 10.0000 mg | ORAL_TABLET | Freq: Every day | ORAL | 0 refills | Status: DC

## 2017-07-10 NOTE — Progress Notes (Signed)
Name: Kevin Montgomery   MRN: [id number]    DOB: 1966-04-07   Date:07/10/2017       Progress Note  Subjective  Chief Complaint  Chief Complaint  Patient presents with  . Follow-up    seen at urgent care Sat- Dx with strep. Was given lidocaine solution and penicillin which he is still taking the pen. Throat is better but still has cong and cough- green production.     Sinusitis  This is a new problem. The current episode started in the past 7 days. The problem is unchanged. There has been no fever. Associated symptoms include congestion, coughing, headaches, a hoarse voice, shortness of breath, sinus pressure, sneezing and a sore throat. Pertinent negatives include no chills, diaphoresis, ear pain or neck pain. Past treatments include acetaminophen and oral decongestants (pen vk). The treatment provided no relief.  Cough  This is a new problem. The current episode started more than 1 year ago. The problem has been unchanged. The cough is non-productive. Associated symptoms include headaches, nasal congestion, postnasal drip, a sore throat, shortness of breath and wheezing. Pertinent negatives include no chest pain, chills, ear pain, fever, heartburn, myalgias, rash, sweats or weight loss. There is no history of environmental allergies.    No problem-specific Assessment & Plan notes found for this encounter.   Past Medical History:  Diagnosis Date  . Arthritis   . Chronic kidney disease    h/o kidney stones  . Complication of anesthesia   . Diabetes mellitus without complication ([place])   . GERD (gastroesophageal reflux disease)    no meds  . Hypertension    pt denies and states that Lisinopril is for kidney protection only from diabetes  . PONV (postoperative nausea and vomiting)     Past Surgical History:  Procedure Laterality Date  . KIDNEY STONE SURGERY    . LITHOTRIPSY    . UMBILICAL HERNIA REPAIR N/A 12/20/2015   Procedure: HERNIA REPAIR UMBILICAL ADULT;  Surgeon: Hubbard Robinson, MD;  Location: [facility] ORS;  Service: General;  Laterality: N/A;    Family History  Problem Relation Age of Onset  . Hyperlipidemia Mother   . Cancer Father        Pancreatic  . Diabetes Father   . Cancer Paternal Uncle   . Cancer Paternal Grandmother     Social History   Socioeconomic History  . Marital status: Married    Spouse name: Not on file  . Number of children: Not on file  . Years of education: Not on file  . Highest education level: Not on file  Social Needs  . Financial resource strain: Not on file  . Food insecurity - worry: Not on file  . Food insecurity - inability: Not on file  . Transportation needs - medical: Not on file  . Transportation needs - non-medical: Not on file  Occupational History  . Not on file  Tobacco Use  . Smoking status: Never Smoker  . Smokeless tobacco: Never Used  Substance and Sexual Activity  . Alcohol use: Yes    Alcohol/week: 0.0 oz    Comment: rare  . Drug use: No  . Sexual activity: Yes  Other Topics Concern  . Not on file  Social History Narrative  . Not on file    No Known Allergies  Outpatient Medications Prior to Visit  Medication Sig Dispense Refill  . aspirin 81 MG tablet Take 1 tablet (81 mg total) by mouth daily. 30 tablet 11  .  Cinnamon 500 MG capsule Take 1,000 mg by mouth daily.    Marland Kitchen glipiZIDE-metformin (METAGLIP) 5-500 MG tablet Take 1 tablet by mouth 2 (two) times daily. 180 tablet 1  . lisinopril (PRINIVIL,ZESTRIL) 5 MG tablet Take 1 tablet (5 mg total) by mouth every morning. 90 tablet 0  . metFORMIN (GLUCOPHAGE) 500 MG tablet Take 1 tablet (500 mg total) by mouth at bedtime. 90 tablet 1  . penicillin v potassium (VEETID) 500 MG tablet Take 1 tablet (500 mg total) by mouth 2 times daily at 12 noon and 4 pm for 10 days. 20 tablet 0  . lidocaine (XYLOCAINE) 2 % solution Use as directed 10 mLs in the mouth or throat every 4 (four) hours as needed for mouth pain. (Patient not taking: Reported on  07/10/2017) 100 mL 0   No facility-administered medications prior to visit.     Review of Systems  Constitutional: Negative for chills, diaphoresis, fever, malaise/fatigue and weight loss.  HENT: Positive for congestion, hoarse voice, postnasal drip, sinus pressure, sneezing and sore throat. Negative for ear discharge and ear pain.   Eyes: Negative for blurred vision.  Respiratory: Positive for cough, shortness of breath and wheezing. Negative for sputum production.   Cardiovascular: Negative for chest pain, palpitations and leg swelling.  Gastrointestinal: Negative for abdominal pain, blood in stool, constipation, diarrhea, heartburn, melena and nausea.  Genitourinary: Negative for dysuria, frequency, hematuria and urgency.  Musculoskeletal: Negative for back pain, joint pain, myalgias and neck pain.  Skin: Negative for rash.  Neurological: Positive for headaches. Negative for dizziness, tingling, sensory change and focal weakness.  Endo/Heme/Allergies: Negative for environmental allergies and polydipsia. Does not bruise/bleed easily.  Psychiatric/Behavioral: Negative for depression and suicidal ideas. The patient is not nervous/anxious and does not have insomnia.      Objective  Vitals:   07/10/17 1034  BP: 126/80  Pulse: (!) 106  Temp: 98.2 F (36.8 C)  TempSrc: Oral  Weight: 192 lb (87.1 kg)  Height: 5\' 6"  (1.676 m)    Physical Exam  Constitutional: He is oriented to person, place, and time and well-developed, well-nourished, and in no distress.  HENT:  Head: Normocephalic.  Right Ear: Tympanic membrane and external ear normal.  Left Ear: Tympanic membrane and external ear normal.  Nose: Right sinus exhibits maxillary sinus tenderness. Left sinus exhibits maxillary sinus tenderness.  Mouth/Throat: Oropharynx is clear and moist.  Eyes: Conjunctivae and EOM are normal. Pupils are equal, round, and reactive to light. Right eye exhibits no discharge. Left eye exhibits no  discharge. No scleral icterus.  Neck: Normal range of motion. Neck supple. No JVD present. No tracheal deviation present. No thyromegaly present.  Cardiovascular: Normal rate, regular rhythm, normal heart sounds and intact distal pulses. Exam reveals no gallop and no friction rub.  No murmur heard. Pulmonary/Chest: No respiratory distress. He has wheezes. He has no rales.  Abdominal: Soft. Bowel sounds are normal. He exhibits no mass. There is no hepatosplenomegaly. There is no tenderness. There is no rebound, no guarding and no CVA tenderness.  Musculoskeletal: Normal range of motion. He exhibits no edema or tenderness.  Lymphadenopathy:    He has no cervical adenopathy.  Neurological: He is alert and oriented to person, place, and time. He has normal sensation, normal strength, normal reflexes and intact cranial nerves. No cranial nerve deficit.  Skin: Skin is warm. No rash noted.  Psychiatric: Mood and affect normal.  Vitals reviewed.     Assessment & Plan  Problem List Items  Addressed This Visit      Endocrine   Type 2 diabetes mellitus without complication, without long-term current use of insulin ([place])    Other Visit Diagnoses    Acute maxillary sinusitis, recurrence not specified    -  Primary   Relevant Medications   levofloxacin (LEVAQUIN) 500 MG tablet   predniSONE (DELTASONE) 10 MG tablet   guaiFENesin-codeine (ROBITUSSIN AC) 100-10 MG/5ML syrup   Bronchiolitis       Relevant Medications   levofloxacin (LEVAQUIN) 500 MG tablet   predniSONE (DELTASONE) 10 MG tablet   guaiFENesin-codeine (ROBITUSSIN AC) 100-10 MG/5ML syrup      Meds ordered this encounter  Medications  . levofloxacin (LEVAQUIN) 500 MG tablet    Sig: Take 1 tablet (500 mg total) by mouth daily.    Dispense:  7 tablet    Refill:  0  . predniSONE (DELTASONE) 10 MG tablet    Sig: Take 1 tablet (10 mg total) by mouth daily with breakfast.    Dispense:  30 tablet    Refill:  0  . guaiFENesin-codeine  (ROBITUSSIN AC) 100-10 MG/5ML syrup    Sig: Take 5 mLs by mouth 3 (three) times daily as needed for cough.    Dispense:  150 mL    Refill:  0      Dr. Otilio Miu [facility]  07/10/17

## 2017-07-10 NOTE — Telephone Encounter (Signed)
Called patient, no answer, left message advising patient to follow up with PCP if symptoms persist.

## 2017-08-16 ENCOUNTER — Ambulatory Visit: Payer: BLUE CROSS/BLUE SHIELD | Admitting: Family Medicine

## 2017-08-16 ENCOUNTER — Encounter: Payer: Self-pay | Admitting: Family Medicine

## 2017-08-16 VITALS — BP 120/80 | HR 88 | Temp 98.0°F | Ht 66.0 in | Wt 193.0 lb

## 2017-08-16 DIAGNOSIS — J40 Bronchitis, not specified as acute or chronic: Secondary | ICD-10-CM | POA: Diagnosis not present

## 2017-08-16 DIAGNOSIS — J0100 Acute maxillary sinusitis, unspecified: Secondary | ICD-10-CM

## 2017-08-16 DIAGNOSIS — K219 Gastro-esophageal reflux disease without esophagitis: Secondary | ICD-10-CM | POA: Diagnosis not present

## 2017-08-16 MED ORDER — PANTOPRAZOLE SODIUM 40 MG PO TBEC
40.0000 mg | DELAYED_RELEASE_TABLET | Freq: Every day | ORAL | 3 refills | Status: DC
Start: 2017-08-16 — End: 2017-11-19

## 2017-08-16 MED ORDER — AMOXICILLIN-POT CLAVULANATE 875-125 MG PO TABS: 1.0000 | ORAL_TABLET | Freq: Two times a day (BID) | ORAL | 0 refills | Status: DC

## 2017-08-16 MED ORDER — GUAIFENESIN-CODEINE 100-10 MG/5ML PO SYRP: 5.0000 mL | ORAL_SOLUTION | Freq: Three times a day (TID) | ORAL | 0 refills | Status: DC | PRN

## 2017-08-16 NOTE — Progress Notes (Signed)
Name: Kevin Montgomery   MRN: [id number]    DOB: Dec 23, 1965   Date:08/16/2017       Progress Note  Subjective  Chief Complaint  Chief Complaint  Patient presents with  . Sinusitis    cough and cong- green production, no fever  . Gastroesophageal Reflux    wants protonix prescribed    Sinusitis  This is a new problem. The current episode started more than 1 year ago. The problem has been waxing and waning since onset. There has been no fever. Associated symptoms include chills, congestion, coughing, diaphoresis, headaches and sinus pressure. Pertinent negatives include no ear pain, hoarse voice, neck pain, shortness of breath, sneezing, sore throat or swollen glands. The treatment provided moderate relief.  Gastroesophageal Reflux  He complains of coughing, dysphagia, heartburn and wheezing. He reports no abdominal pain, no belching, no chest pain, no choking, no hoarse voice, no nausea, no sore throat or no water brash. This is a chronic problem. The current episode started more than 1 year ago. The symptoms are aggravated by certain foods. Pertinent negatives include no melena or weight loss.  Cough  This is a new problem. The current episode started in the past 7 days. The problem has been waxing and waning. The cough is productive of purulent sputum (yellow/green). Associated symptoms include chills, headaches, heartburn and wheezing. Pertinent negatives include no chest pain, ear pain, fever, myalgias, rash, sore throat, shortness of breath or weight loss. There is no history of environmental allergies.    No problem-specific Assessment & Plan notes found for this encounter.   Past Medical History:  Diagnosis Date  . Arthritis   . Chronic kidney disease    h/o kidney stones  . Complication of anesthesia   . Diabetes mellitus without complication ([place])   . GERD (gastroesophageal reflux disease)    no meds  . Hypertension    pt denies and states that Lisinopril is for kidney  protection only from diabetes  . PONV (postoperative nausea and vomiting)     Past Surgical History:  Procedure Laterality Date  . KIDNEY STONE SURGERY    . LITHOTRIPSY    . UMBILICAL HERNIA REPAIR N/A 12/20/2015   Procedure: HERNIA REPAIR UMBILICAL ADULT;  Surgeon: Hubbard Robinson, MD;  Location: [facility] ORS;  Service: General;  Laterality: N/A;    Family History  Problem Relation Age of Onset  . Hyperlipidemia Mother   . Cancer Father        Pancreatic  . Diabetes Father   . Cancer Paternal Uncle   . Cancer Paternal Grandmother     Social History   Socioeconomic History  . Marital status: Married    Spouse name: Not on file  . Number of children: Not on file  . Years of education: Not on file  . Highest education level: Not on file  Social Needs  . Financial resource strain: Not on file  . Food insecurity - worry: Not on file  . Food insecurity - inability: Not on file  . Transportation needs - medical: Not on file  . Transportation needs - non-medical: Not on file  Occupational History  . Not on file  Tobacco Use  . Smoking status: Never Smoker  . Smokeless tobacco: Never Used  Substance and Sexual Activity  . Alcohol use: Yes    Alcohol/week: 0.0 oz    Comment: rare  . Drug use: No  . Sexual activity: Yes  Other Topics Concern  . Not on file  Social History Narrative  . Not on file    No Known Allergies  Outpatient Medications Prior to Visit  Medication Sig Dispense Refill  . aspirin 81 MG tablet Take 1 tablet (81 mg total) by mouth daily. 30 tablet 11  . Cinnamon 500 MG capsule Take 1,000 mg by mouth daily.    Marland Kitchen glipiZIDE-metformin (METAGLIP) 5-500 MG tablet Take 1 tablet by mouth 2 (two) times daily. 180 tablet 1  . lisinopril (PRINIVIL,ZESTRIL) 5 MG tablet Take 1 tablet (5 mg total) by mouth every morning. 90 tablet 0  . metFORMIN (GLUCOPHAGE) 500 MG tablet Take 1 tablet (500 mg total) by mouth at bedtime. 90 tablet 1  . guaiFENesin-codeine  (ROBITUSSIN AC) 100-10 MG/5ML syrup Take 5 mLs by mouth 3 (three) times daily as needed for cough. 150 mL 0  . levofloxacin (LEVAQUIN) 500 MG tablet Take 1 tablet (500 mg total) by mouth daily. 7 tablet 0  . lidocaine (XYLOCAINE) 2 % solution Use as directed 10 mLs in the mouth or throat every 4 (four) hours as needed for mouth pain. (Patient not taking: Reported on 07/10/2017) 100 mL 0  . predniSONE (DELTASONE) 10 MG tablet Take 1 tablet (10 mg total) by mouth daily with breakfast. 30 tablet 0   No facility-administered medications prior to visit.     Review of Systems  Constitutional: Positive for chills and diaphoresis. Negative for fever, malaise/fatigue and weight loss.  HENT: Positive for congestion and sinus pressure. Negative for ear discharge, ear pain, hoarse voice, sneezing and sore throat.   Eyes: Negative for blurred vision.  Respiratory: Positive for cough and wheezing. Negative for sputum production, choking and shortness of breath.   Cardiovascular: Negative for chest pain, palpitations and leg swelling.  Gastrointestinal: Positive for dysphagia and heartburn. Negative for abdominal pain, blood in stool, constipation, diarrhea, melena and nausea.  Genitourinary: Negative for dysuria, frequency, hematuria and urgency.  Musculoskeletal: Negative for back pain, joint pain, myalgias and neck pain.  Skin: Negative for rash.  Neurological: Positive for headaches. Negative for dizziness, tingling, sensory change and focal weakness.  Endo/Heme/Allergies: Negative for environmental allergies and polydipsia. Does not bruise/bleed easily.  Psychiatric/Behavioral: Negative for depression and suicidal ideas. The patient is not nervous/anxious and does not have insomnia.      Objective  Vitals:   08/16/17 1528  BP: 120/80  Pulse: 88  Temp: 98 F (36.7 C)  TempSrc: Oral  Weight: 193 lb (87.5 kg)  Height: 5\' 6"  (1.676 m)    Physical Exam  Constitutional: He is oriented to person,  place, and time and well-developed, well-nourished, and in no distress.  HENT:  Head: Normocephalic.  Right Ear: Tympanic membrane, external ear and ear canal normal.  Left Ear: Tympanic membrane, external ear and ear canal normal.  Nose: Nose normal.  Mouth/Throat: Oropharynx is clear and moist. No oropharyngeal exudate, posterior oropharyngeal edema or posterior oropharyngeal erythema.  Eyes: Conjunctivae and EOM are normal. Pupils are equal, round, and reactive to light. Right eye exhibits no discharge. Left eye exhibits no discharge. No scleral icterus.  Neck: Normal range of motion. Neck supple. No JVD present. No tracheal deviation present. No thyromegaly present.  Cardiovascular: Normal rate, regular rhythm, normal heart sounds and intact distal pulses. Exam reveals no gallop and no friction rub.  No murmur heard. Pulmonary/Chest: Breath sounds normal. No respiratory distress. He has no wheezes. He has no rales.  Abdominal: Soft. Bowel sounds are normal. He exhibits no mass. There is no hepatosplenomegaly. There  is no tenderness. There is no rebound, no guarding and no CVA tenderness.  Musculoskeletal: Normal range of motion. He exhibits no edema or tenderness.  Lymphadenopathy:    He has no cervical adenopathy.  Neurological: He is alert and oriented to person, place, and time. He has normal sensation, normal strength, normal reflexes and intact cranial nerves. No cranial nerve deficit.  Skin: Skin is warm. No rash noted.  Psychiatric: Mood and affect normal.  Nursing note and vitals reviewed.     Assessment & Plan  Problem List Items Addressed This Visit    None    Visit Diagnoses    Acute maxillary sinusitis, recurrence not specified    -  Primary   Relevant Medications   amoxicillin-clavulanate (AUGMENTIN) 875-125 MG tablet   guaiFENesin-codeine (ROBITUSSIN AC) 100-10 MG/5ML syrup   Bronchitis       Relevant Medications   guaiFENesin-codeine (ROBITUSSIN AC) 100-10  MG/5ML syrup   Gastroesophageal reflux disease, esophagitis presence not specified       Relevant Medications   pantoprazole (PROTONIX) 40 MG tablet      Meds ordered this encounter  Medications  . amoxicillin-clavulanate (AUGMENTIN) 875-125 MG tablet    Sig: Take 1 tablet by mouth 2 (two) times daily.    Dispense:  20 tablet    Refill:  0  . pantoprazole (PROTONIX) 40 MG tablet    Sig: Take 1 tablet (40 mg total) by mouth daily.    Dispense:  30 tablet    Refill:  3  . guaiFENesin-codeine (ROBITUSSIN AC) 100-10 MG/5ML syrup    Sig: Take 5 mLs by mouth 3 (three) times daily as needed for cough.    Dispense:  150 mL    Refill:  0      Dr. Macon [facility]  08/16/17

## 2017-09-10 ENCOUNTER — Other Ambulatory Visit: Payer: Self-pay | Admitting: Family Medicine

## 2017-09-10 DIAGNOSIS — I10 Essential (primary) hypertension: Secondary | ICD-10-CM

## 2017-10-01 ENCOUNTER — Other Ambulatory Visit: Payer: Self-pay | Admitting: Family Medicine

## 2017-10-01 DIAGNOSIS — E119 Type 2 diabetes mellitus without complications: Secondary | ICD-10-CM

## 2017-10-02 ENCOUNTER — Other Ambulatory Visit: Payer: Self-pay | Admitting: Family Medicine

## 2017-10-02 DIAGNOSIS — E119 Type 2 diabetes mellitus without complications: Secondary | ICD-10-CM

## 2017-10-08 ENCOUNTER — Ambulatory Visit: Payer: BLUE CROSS/BLUE SHIELD | Admitting: Family Medicine

## 2017-10-08 ENCOUNTER — Encounter: Payer: Self-pay | Admitting: Family Medicine

## 2017-10-08 VITALS — BP 128/80 | HR 72 | Ht 66.0 in | Wt 187.0 lb

## 2017-10-08 DIAGNOSIS — S1096XA Insect bite of unspecified part of neck, initial encounter: Secondary | ICD-10-CM

## 2017-10-08 DIAGNOSIS — S30860A Insect bite (nonvenomous) of lower back and pelvis, initial encounter: Secondary | ICD-10-CM

## 2017-10-08 DIAGNOSIS — W57XXXA Bitten or stung by nonvenomous insect and other nonvenomous arthropods, initial encounter: Secondary | ICD-10-CM

## 2017-10-08 MED ORDER — DOXYCYCLINE HYCLATE 100 MG PO TABS: 100.0000 mg | ORAL_TABLET | Freq: Two times a day (BID) | ORAL | 0 refills | Status: DC

## 2017-10-08 MED ORDER — MUPIROCIN 2 % EX OINT: 1.0000 "application " | TOPICAL_OINTMENT | Freq: Two times a day (BID) | CUTANEOUS | 0 refills | Status: DC

## 2017-10-08 NOTE — Progress Notes (Signed)
Name: Kevin Montgomery   MRN: [id number]    DOB: 1965/07/25   Date:10/08/2017       Progress Note  Subjective  Chief Complaint  Chief Complaint  Patient presents with  . Insect Bite    on back of neck and tick was removed from back    Animal Bite   The incident occurred more than 2 days ago. The incident occurred at home. There is an injury to the neck (back). There is an injury to the upper back. The patient is experiencing no pain. Pertinent negatives include no chest pain, no abdominal pain, no nausea, no headaches, no neck pain, no focal weakness, no tingling and no cough.    No problem-specific Assessment & Plan notes found for this encounter.   Past Medical History:  Diagnosis Date  . Arthritis   . Chronic kidney disease    h/o kidney stones  . Complication of anesthesia   . Diabetes mellitus without complication ([place])   . GERD (gastroesophageal reflux disease)    no meds  . Hypertension    pt denies and states that Lisinopril is for kidney protection only from diabetes  . PONV (postoperative nausea and vomiting)     Past Surgical History:  Procedure Laterality Date  . KIDNEY STONE SURGERY    . LITHOTRIPSY    . UMBILICAL HERNIA REPAIR N/A 12/20/2015   Procedure: HERNIA REPAIR UMBILICAL ADULT;  Surgeon: Hubbard Robinson, MD;  Location: [facility] ORS;  Service: General;  Laterality: N/A;    Family History  Problem Relation Age of Onset  . Hyperlipidemia Mother   . Cancer Father        Pancreatic  . Diabetes Father   . Cancer Paternal Uncle   . Cancer Paternal Grandmother     Social History   Socioeconomic History  . Marital status: Married    Spouse name: Not on file  . Number of children: Not on file  . Years of education: Not on file  . Highest education level: Not on file  Occupational History  . Not on file  Social Needs  . Financial resource strain: Not on file  . Food insecurity:    Worry: Not on file    Inability: Not on file  . Transportation  needs:    Medical: Not on file    Non-medical: Not on file  Tobacco Use  . Smoking status: Never Smoker  . Smokeless tobacco: Never Used  Substance and Sexual Activity  . Alcohol use: Yes    Alcohol/week: 0.0 oz    Comment: rare  . Drug use: No  . Sexual activity: Yes  Lifestyle  . Physical activity:    Days per week: Not on file    Minutes per session: Not on file  . Stress: Not on file  Relationships  . Social connections:    Talks on phone: Not on file    Gets together: Not on file    Attends religious service: Not on file    Active member of club or organization: Not on file    Attends meetings of clubs or organizations: Not on file    Relationship status: Not on file  . Intimate partner violence:    Fear of current or ex partner: Not on file    Emotionally abused: Not on file    Physically abused: Not on file    Forced sexual activity: Not on file  Other Topics Concern  . Not on file  Social History  Narrative  . Not on file    No Known Allergies  Outpatient Medications Prior to Visit  Medication Sig Dispense Refill  . aspirin 81 MG tablet Take 1 tablet (81 mg total) by mouth daily. 30 tablet 11  . Cinnamon 500 MG capsule Take 1,000 mg by mouth daily.    Marland Kitchen glipiZIDE-metformin (METAGLIP) 5-500 MG tablet TAKE ONE TABLET BY MOUTH 2 TIMES DAILY 60 tablet 0  . lisinopril (PRINIVIL,ZESTRIL) 5 MG tablet TAKE (1) TABLET BY MOUTH EVERY MORNING 90 tablet 0  . metFORMIN (GLUCOPHAGE) 500 MG tablet TAKE ONE TABLET BY MOUTH AT BEDTIME 90 tablet 0  . pantoprazole (PROTONIX) 40 MG tablet Take 1 tablet (40 mg total) by mouth daily. 30 tablet 3  . amoxicillin-clavulanate (AUGMENTIN) 875-125 MG tablet Take 1 tablet by mouth 2 (two) times daily. 20 tablet 0  . guaiFENesin-codeine (ROBITUSSIN AC) 100-10 MG/5ML syrup Take 5 mLs by mouth 3 (three) times daily as needed for cough. 150 mL 0   No facility-administered medications prior to visit.     Review of Systems  Constitutional:  Negative for chills, fever, malaise/fatigue and weight loss.  HENT: Negative for ear discharge, ear pain and sore throat.   Eyes: Negative for blurred vision.  Respiratory: Negative for cough, sputum production, shortness of breath and wheezing.   Cardiovascular: Negative for chest pain, palpitations and leg swelling.  Gastrointestinal: Negative for abdominal pain, blood in stool, constipation, diarrhea, heartburn, melena and nausea.  Genitourinary: Negative for dysuria, frequency, hematuria and urgency.  Musculoskeletal: Negative for back pain, joint pain, myalgias and neck pain.  Skin: Negative for rash.  Neurological: Negative for dizziness, tingling, sensory change, focal weakness and headaches.  Endo/Heme/Allergies: Negative for environmental allergies and polydipsia. Does not bruise/bleed easily.  Psychiatric/Behavioral: Negative for depression and suicidal ideas. The patient is not nervous/anxious and does not have insomnia.      Objective  Vitals:   10/08/17 1607  BP: 128/80  Pulse: 72  Weight: 187 lb (84.8 kg)  Height: 5\' 6"  (1.676 m)    Physical Exam  Constitutional: He is oriented to person, place, and time.  HENT:  Head: Normocephalic.  Right Ear: External ear normal.  Left Ear: External ear normal.  Nose: Nose normal.  Mouth/Throat: Oropharynx is clear and moist.  Eyes: Pupils are equal, round, and reactive to light. Conjunctivae and EOM are normal. Right eye exhibits no discharge. Left eye exhibits no discharge. No scleral icterus.  Neck: Normal range of motion. Neck supple. No JVD present. No tracheal deviation present. No thyromegaly present.  Cardiovascular: Normal rate, regular rhythm, normal heart sounds and intact distal pulses. Exam reveals no gallop and no friction rub.  No murmur heard. Pulmonary/Chest: Breath sounds normal. No respiratory distress. He has no wheezes. He has no rales.  Abdominal: Soft. Bowel sounds are normal. He exhibits no mass. There is  no hepatosplenomegaly. There is no tenderness. There is no rebound, no guarding and no CVA tenderness.  Musculoskeletal: Normal range of motion. He exhibits no edema or tenderness.  Lymphadenopathy:    He has no cervical adenopathy.  Neurological: He is alert and oriented to person, place, and time. He has normal strength and normal reflexes. No cranial nerve deficit.  Skin: Skin is warm. No rash noted. There is erythema.  1) indurated area left neck /no vesicle/ 1 cm  2) circular erythema/ dark area center/ removrd with #15  Triple antibiotic/dressing  Nursing note and vitals reviewed.     Assessment & Plan  Problem List Items Addressed This Visit    None    Visit Diagnoses    Insect bite of lower back, initial encounter    -  Primary   Relevant Medications   doxycycline (VIBRA-TABS) 100 MG tablet   mupirocin ointment (BACTROBAN) 2 %   Insect bite of neck, initial encounter       Relevant Medications   doxycycline (VIBRA-TABS) 100 MG tablet   mupirocin ointment (BACTROBAN) 2 %      Meds ordered this encounter  Medications  . doxycycline (VIBRA-TABS) 100 MG tablet    Sig: Take 1 tablet (100 mg total) by mouth 2 (two) times daily.    Dispense:  20 tablet    Refill:  0  . mupirocin ointment (BACTROBAN) 2 %    Sig: Apply 1 application topically 2 (two) times daily.    Dispense:  22 g    Refill:  0      Dr. Mac[facility]  10/08/17

## 2017-11-19 ENCOUNTER — Other Ambulatory Visit: Payer: Self-pay | Admitting: Family Medicine

## 2017-11-19 DIAGNOSIS — K219 Gastro-esophageal reflux disease without esophagitis: Secondary | ICD-10-CM

## 2017-12-05 ENCOUNTER — Ambulatory Visit: Payer: BLUE CROSS/BLUE SHIELD | Admitting: Family Medicine

## 2017-12-05 ENCOUNTER — Encounter: Payer: Self-pay | Admitting: Family Medicine

## 2017-12-05 VITALS — BP 120/80 | HR 76 | Ht 66.0 in | Wt 184.0 lb

## 2017-12-05 DIAGNOSIS — I10 Essential (primary) hypertension: Secondary | ICD-10-CM

## 2017-12-05 DIAGNOSIS — E119 Type 2 diabetes mellitus without complications: Secondary | ICD-10-CM | POA: Diagnosis not present

## 2017-12-05 DIAGNOSIS — E782 Mixed hyperlipidemia: Secondary | ICD-10-CM

## 2017-12-05 DIAGNOSIS — R251 Tremor, unspecified: Secondary | ICD-10-CM | POA: Diagnosis not present

## 2017-12-05 DIAGNOSIS — M519 Unspecified thoracic, thoracolumbar and lumbosacral intervertebral disc disorder: Secondary | ICD-10-CM | POA: Diagnosis not present

## 2017-12-05 DIAGNOSIS — K219 Gastro-esophageal reflux disease without esophagitis: Secondary | ICD-10-CM | POA: Diagnosis not present

## 2017-12-05 MED ORDER — LISINOPRIL 5 MG PO TABS: ORAL_TABLET | ORAL | 1 refills | Status: DC

## 2017-12-05 MED ORDER — TRAMADOL HCL 50 MG PO TABS
50.0000 mg | ORAL_TABLET | Freq: Three times a day (TID) | ORAL | 0 refills | Status: DC | PRN
Start: 2017-12-05 — End: 2018-06-10

## 2017-12-05 MED ORDER — MELOXICAM 15 MG PO TABS: 15.0000 mg | ORAL_TABLET | Freq: Every day | ORAL | 0 refills | Status: DC

## 2017-12-05 MED ORDER — METFORMIN HCL 500 MG PO TABS: 500.0000 mg | ORAL_TABLET | Freq: Every day | ORAL | 1 refills | Status: DC

## 2017-12-05 MED ORDER — CYCLOBENZAPRINE HCL 10 MG PO TABS: 10.0000 mg | ORAL_TABLET | Freq: Three times a day (TID) | ORAL | 0 refills | Status: DC | PRN

## 2017-12-05 MED ORDER — GLIPIZIDE-METFORMIN HCL 5-500 MG PO TABS: ORAL_TABLET | ORAL | 1 refills | Status: DC

## 2017-12-05 MED ORDER — PANTOPRAZOLE SODIUM 40 MG PO TBEC: DELAYED_RELEASE_TABLET | ORAL | 1 refills | Status: DC

## 2017-12-05 NOTE — Assessment & Plan Note (Deleted)
Draw lipids

## 2017-12-05 NOTE — Progress Notes (Addendum)
Name: Kevin Montgomery   MRN: [id number]    DOB: 1965/06/28   Date:12/05/2017       Progress Note  Subjective  Chief Complaint  Chief Complaint  Patient presents with  . Back Pain    twisted with a box and felt a pain go across the lower back.  . Diabetes    has been on 2 rounds of prednisone through [facility]- last one 1 1/2 months ago  . Tremors    in both hands especially when gets excited    Back Pain  This is a new problem. The current episode started yesterday. The problem occurs daily. The problem is unchanged. The pain is present in the lumbar spine. The quality of the pain is described as aching. The pain does not radiate. The pain is at a severity of 8/10. The pain is mild. The pain is worse during the night. The symptoms are aggravated by bending, sitting and twisting. Pertinent negatives include no abdominal pain, bladder incontinence, bowel incontinence, chest pain, dysuria, fever, headaches, leg pain, numbness, paresis, paresthesias, pelvic pain, perianal numbness, tingling, weakness or weight loss. He has tried NSAIDs for the symptoms. The treatment provided moderate relief.  Diabetes  He presents for his follow-up diabetic visit. He has type 2 diabetes mellitus. His disease course has been stable. Pertinent negatives for hypoglycemia include no confusion, dizziness, headaches, hunger, mood changes, nervousness/anxiousness, pallor, seizures, sleepiness, speech difficulty, sweats or tremors. Pertinent negatives for diabetes include no blurred vision, no chest pain, no fatigue, no foot paresthesias, no foot ulcerations, no polydipsia, no polyphagia, no polyuria, no visual change, no weakness and no weight loss. There are no hypoglycemic complications. Symptoms are stable. There are no diabetic complications. Risk factors for coronary artery disease include diabetes mellitus, dyslipidemia, male sex and hypertension. Current diabetic treatment includes oral agent (dual therapy). He is  compliant with treatment all of the time. His weight is stable. He is following a generally unhealthy diet. Meal planning includes avoidance of concentrated sweets. He participates in exercise intermittently. An ACE inhibitor/angiotensin II receptor blocker is being taken. He does not see a podiatrist.Eye exam is not current.  Neurologic Problem  The patient's pertinent negatives include no altered mental status, clumsiness, focal sensory loss, focal weakness, loss of balance, memory loss, near-syncope, slurred speech, syncope, visual change or weakness. Primary symptoms comment: increased tremor with excitement or stress. This is a recurrent problem. Associated symptoms include back pain. Pertinent negatives include no abdominal pain, bladder incontinence, bowel incontinence, chest pain, confusion, dizziness, fatigue, fever, headaches, nausea, neck pain, palpitations or shortness of breath. The treatment provided no relief. There is no history of a bleeding disorder, a clotting disorder, a CVA, dementia, head trauma, liver disease, mood changes or seizures.  Gastroesophageal Reflux  He reports no abdominal pain, no belching, no chest pain, no choking, no coughing, no dysphagia, no early satiety, no globus sensation, no heartburn, no hoarse voice, no nausea, no sore throat or no wheezing. increased tremor with excitement or stress. This is a chronic problem. The problem occurs rarely. The problem has been unchanged. The symptoms are aggravated by certain foods. Pertinent negatives include no fatigue, melena or weight loss. He has tried a PPI for the symptoms.    Essential hypertension Controlled. Will continue lisinopril 5 mg. And evaluate with renal panel.  Type 2 diabetes mellitus without complication, without long-term current use of insulin (HCC) Controlled. Confirm with A1C. Continue metformen and glipizide  Hyperlipidemia Stable with diet control.  Recheck lipid panel.  Gastroesophageal reflux  disease Stable. Continue with pantoprazole 40 mg daily.   Past Medical History:  Diagnosis Date  . Arthritis   . Chronic kidney disease    h/o kidney stones  . Complication of anesthesia   . Diabetes mellitus without complication ([place])   . GERD (gastroesophageal reflux disease)    no meds  . Hypertension    pt denies and states that Lisinopril is for kidney protection only from diabetes  . PONV (postoperative nausea and vomiting)     Past Surgical History:  Procedure Laterality Date  . KIDNEY STONE SURGERY    . LITHOTRIPSY    . UMBILICAL HERNIA REPAIR N/A 12/20/2015   Procedure: HERNIA REPAIR UMBILICAL ADULT;  Surgeon: Hubbard Robinson, MD;  Location: [facility] ORS;  Service: General;  Laterality: N/A;    Family History  Problem Relation Age of Onset  . Hyperlipidemia Mother   . Cancer Father        Pancreatic  . Diabetes Father   . Cancer Paternal Uncle   . Cancer Paternal Grandmother     Social History   Socioeconomic History  . Marital status: Married    Spouse name: Not on file  . Number of children: Not on file  . Years of education: Not on file  . Highest education level: Not on file  Occupational History  . Not on file  Social Needs  . Financial resource strain: Not on file  . Food insecurity:    Worry: Not on file    Inability: Not on file  . Transportation needs:    Medical: Not on file    Non-medical: Not on file  Tobacco Use  . Smoking status: Never Smoker  . Smokeless tobacco: Never Used  Substance and Sexual Activity  . Alcohol use: Yes    Alcohol/week: 0.0 oz    Comment: rare  . Drug use: No  . Sexual activity: Yes  Lifestyle  . Physical activity:    Days per week: Not on file    Minutes per session: Not on file  . Stress: Not on file  Relationships  . Social connections:    Talks on phone: Not on file    Gets together: Not on file    Attends religious service: Not on file    Active member of club or organization: Not on file     Attends meetings of clubs or organizations: Not on file    Relationship status: Not on file  . Intimate partner violence:    Fear of current or ex partner: Not on file    Emotionally abused: Not on file    Physically abused: Not on file    Forced sexual activity: Not on file  Other Topics Concern  . Not on file  Social History Narrative  . Not on file    No Known Allergies  Outpatient Medications Prior to Visit  Medication Sig Dispense Refill  . aspirin 81 MG tablet Take 1 tablet (81 mg total) by mouth daily. 30 tablet 11  . Cinnamon 500 MG capsule Take 1,000 mg by mouth daily.    Marland Kitchen glipiZIDE-metformin (METAGLIP) 5-500 MG tablet TAKE ONE TABLET BY MOUTH 2 TIMES DAILY 60 tablet 0  . lisinopril (PRINIVIL,ZESTRIL) 5 MG tablet TAKE (1) TABLET BY MOUTH EVERY MORNING 90 tablet 0  . metFORMIN (GLUCOPHAGE) 500 MG tablet TAKE ONE TABLET BY MOUTH AT BEDTIME 90 tablet 0  . pantoprazole (PROTONIX) 40 MG tablet TAKE (1) TABLET  BY MOUTH EVERY DAY 30 tablet 3  . doxycycline (VIBRA-TABS) 100 MG tablet Take 1 tablet (100 mg total) by mouth 2 (two) times daily. 20 tablet 0  . mupirocin ointment (BACTROBAN) 2 % Apply 1 application topically 2 (two) times daily. 22 g 0   No facility-administered medications prior to visit.     Review of Systems  Constitutional: Negative for chills, fatigue, fever, malaise/fatigue and weight loss.  HENT: Negative for ear discharge, ear pain, hoarse voice and sore throat.   Eyes: Negative for blurred vision.  Respiratory: Negative for cough, sputum production, choking, shortness of breath and wheezing.   Cardiovascular: Negative for chest pain, palpitations, leg swelling and near-syncope.  Gastrointestinal: Negative for abdominal pain, blood in stool, bowel incontinence, constipation, diarrhea, dysphagia, heartburn, melena and nausea.  Genitourinary: Negative for bladder incontinence, dysuria, frequency, hematuria, pelvic pain and urgency.  Musculoskeletal: Positive  for back pain. Negative for joint pain, myalgias and neck pain.  Skin: Negative for pallor and rash.  Neurological: Negative for dizziness, tingling, tremors, sensory change, focal weakness, seizures, syncope, speech difficulty, weakness, numbness, headaches, paresthesias and loss of balance.  Endo/Heme/Allergies: Negative for environmental allergies, polydipsia and polyphagia. Does not bruise/bleed easily.  Psychiatric/Behavioral: Negative for confusion, depression, memory loss and suicidal ideas. The patient is not nervous/anxious and does not have insomnia.      Objective  Vitals:   12/05/17 0925  BP: 120/80  Pulse: 76  Weight: 184 lb (83.5 kg)  Height: 5' 6"  (1.676 m)    Physical Exam  Constitutional: He is oriented to person, place, and time.  HENT:  Head: Normocephalic.  Right Ear: External ear normal.  Left Ear: External ear normal.  Nose: Nose normal.  Mouth/Throat: Oropharynx is clear and moist.  Eyes: Pupils are equal, round, and reactive to light. Conjunctivae and EOM are normal. Right eye exhibits no discharge. Left eye exhibits no discharge. No scleral icterus.  Neck: Normal range of motion. Neck supple. No JVD present. No tracheal deviation present. No thyromegaly present.  Cardiovascular: Normal rate, regular rhythm, normal heart sounds and intact distal pulses. Exam reveals no gallop and no friction rub.  No murmur heard. Pulmonary/Chest: Breath sounds normal. No respiratory distress. He has no wheezes. He has no rales.  Abdominal: Soft. Bowel sounds are normal. He exhibits no mass. There is no hepatosplenomegaly. There is no tenderness. There is no rebound, no guarding and no CVA tenderness.  Musculoskeletal: Normal range of motion. He exhibits no edema or tenderness.  Feet:  Right Foot:  Protective Sensation: 10 sites tested. 10 sites sensed.  Skin Integrity: Negative for ulcer, blister, skin breakdown, erythema, warmth, callus or dry skin.  Left Foot:   Protective Sensation: 10 sites tested. 10 sites sensed.  Skin Integrity: Negative for ulcer, blister, skin breakdown, erythema, warmth, callus or dry skin.  Lymphadenopathy:    He has no cervical adenopathy.  Neurological: He is alert and oriented to person, place, and time. He has normal strength and normal reflexes. No cranial nerve deficit.  Skin: Skin is warm. No rash noted.  Nursing note and vitals reviewed.     Assessment & Plan  Problem List Items Addressed This Visit      Cardiovascular and Mediastinum   Essential hypertension    Controlled. Will continue lisinopril 5 mg. And evaluate with renal panel.      Relevant Medications   lisinopril (PRINIVIL,ZESTRIL) 5 MG tablet   Other Relevant Orders   Renal Function Panel  Digestive   Gastroesophageal reflux disease    Stable. Continue with pantoprazole 40 mg daily.      Relevant Medications   pantoprazole (PROTONIX) 40 MG tablet     Endocrine   Type 2 diabetes mellitus without complication, without long-term current use of insulin (HCC)    Controlled. Confirm with A1C. Continue metformen and glipizide      Relevant Medications   lisinopril (PRINIVIL,ZESTRIL) 5 MG tablet   metFORMIN (GLUCOPHAGE) 500 MG tablet   glipiZIDE-metformin (METAGLIP) 5-500 MG tablet   Other Relevant Orders   Hemoglobin A1c   Lipid panel     Other   Hyperlipidemia    Stable with diet control. Recheck lipid panel.      Relevant Medications   lisinopril (PRINIVIL,ZESTRIL) 5 MG tablet   Other Relevant Orders   Lipid panel    Other Visit Diagnoses    Lumbar disc disorder    -  Primary   Suspect partial herniated disc will treat with cyclobenzaprine,meloxicam, and tramadol   Relevant Medications   cyclobenzaprine (FLEXERIL) 10 MG tablet   traMADol (ULTRAM) 50 MG tablet   meloxicam (MOBIC) 15 MG tablet   Tremor       send to neurology for further eval   Relevant Orders   Ambulatory referral to Neurology      Meds ordered  this encounter  Medications  . lisinopril (PRINIVIL,ZESTRIL) 5 MG tablet    Sig: TAKE (1) TABLET BY MOUTH EVERY MORNING    Dispense:  90 tablet    Refill:  1  . pantoprazole (PROTONIX) 40 MG tablet    Sig: TAKE (1) TABLET BY MOUTH EVERY DAY    Dispense:  90 tablet    Refill:  1  . metFORMIN (GLUCOPHAGE) 500 MG tablet    Sig: Take 1 tablet (500 mg total) by mouth at bedtime.    Dispense:  90 tablet    Refill:  1    On this one I have 1 time daily. Glipi- Met is BID. Pt needs to sched appt for May  . glipiZIDE-metformin (METAGLIP) 5-500 MG tablet    Sig: TAKE ONE TABLET BY MOUTH 2 TIMES DAILY    Dispense:  180 tablet    Refill:  1    Time for med refill appt  . cyclobenzaprine (FLEXERIL) 10 MG tablet    Sig: Take 1 tablet (10 mg total) by mouth 3 (three) times daily as needed for muscle spasms.    Dispense:  30 tablet    Refill:  0  . traMADol (ULTRAM) 50 MG tablet    Sig: Take 1 tablet (50 mg total) by mouth every 8 (eight) hours as needed.    Dispense:  30 tablet    Refill:  0  . meloxicam (MOBIC) 15 MG tablet    Sig: Take 1 tablet (15 mg total) by mouth daily.    Dispense:  30 tablet    Refill:  0      Dr. Arrian Manson [facility]  12/05/17

## 2017-12-05 NOTE — Assessment & Plan Note (Signed)
Controlled. Will continue lisinopril 5 mg. And evaluate with renal panel.

## 2017-12-05 NOTE — Assessment & Plan Note (Signed)
Stable  - Continue with pantoprazole 40mg daily

## 2017-12-05 NOTE — Assessment & Plan Note (Deleted)
Cont lisinopril as directed

## 2017-12-05 NOTE — Assessment & Plan Note (Signed)
Controlled. Confirm with A1C. Continue metformen and glipizide

## 2017-12-05 NOTE — Assessment & Plan Note (Signed)
Stable with diet control. Recheck lipid panel.

## 2017-12-05 NOTE — Assessment & Plan Note (Deleted)
Draw A1C and continue meds as prescribed

## 2017-12-10 ENCOUNTER — Other Ambulatory Visit: Payer: Self-pay

## 2017-12-13 LAB — LIPID PANEL
Chol/HDL Ratio: 4.7 ratio (ref 0.0–5.0)
Cholesterol, Total: 155 mg/dL (ref 100–199)
HDL: 33 mg/dL — AB (ref >39)
LDL Calculated: 83 mg/dL (ref 0–99)
TRIGLYCERIDES: 197 mg/dL — AB (ref 0–149)
VLDL CHOLESTEROL CAL: 39 mg/dL (ref 5–40)

## 2017-12-13 LAB — RENAL FUNCTION PANEL
ALBUMIN: 4.5 g/dL (ref 3.5–5.5)
BUN/Creatinine Ratio: 15 (ref 9–20)
BUN: 13 mg/dL (ref 6–24)
CO2: 22 mmol/L (ref 20–29)
CREATININE: 0.88 mg/dL (ref 0.76–1.27)
Calcium: 9.9 mg/dL (ref 8.7–10.2)
Chloride: 100 mmol/L (ref 96–106)
GFR, EST AFRICAN AMERICAN: 115 mL/min/{1.73_m2} (ref >59)
GFR, EST NON AFRICAN AMERICAN: 99 mL/min/{1.73_m2} (ref >59)
GLUCOSE: 197 mg/dL — AB (ref 65–99)
POTASSIUM: 4 mmol/L (ref 3.5–5.2)
Phosphorus: 3.4 mg/dL (ref 2.5–4.5)
Sodium: 139 mmol/L (ref 134–144)

## 2017-12-13 LAB — HEMOGLOBIN A1C
ESTIMATED AVERAGE GLUCOSE: 229 mg/dL
Hgb A1c MFr Bld: 9.6 % — ABNORMAL HIGH (ref 4.8–5.6)

## 2017-12-14 ENCOUNTER — Other Ambulatory Visit: Payer: Self-pay

## 2017-12-14 DIAGNOSIS — E119 Type 2 diabetes mellitus without complications: Secondary | ICD-10-CM

## 2017-12-14 MED ORDER — METFORMIN HCL 500 MG PO TABS: 500.0000 mg | ORAL_TABLET | Freq: Two times a day (BID) | ORAL | 1 refills | Status: DC

## 2018-03-19 ENCOUNTER — Other Ambulatory Visit: Payer: Self-pay | Admitting: Family Medicine

## 2018-03-19 DIAGNOSIS — I10 Essential (primary) hypertension: Secondary | ICD-10-CM

## 2018-06-10 ENCOUNTER — Other Ambulatory Visit: Payer: Self-pay

## 2018-06-10 ENCOUNTER — Ambulatory Visit (INDEPENDENT_AMBULATORY_CARE_PROVIDER_SITE_OTHER)
Admit: 2018-06-10 | Discharge: 2018-06-10 | Disposition: A | Discharge status: Home / Self Care | Payer: BLUE CROSS/BLUE SHIELD | Attending: Family Medicine | Admitting: Family Medicine

## 2018-06-10 ENCOUNTER — Other Ambulatory Visit: Payer: Self-pay | Admitting: Family Medicine

## 2018-06-10 ENCOUNTER — Ambulatory Visit
Admission: EM | Admit: 2018-06-10 | Discharge: 2018-06-10 | Disposition: A | Discharge status: Home / Self Care | Payer: BLUE CROSS/BLUE SHIELD | Attending: Family Medicine | Admitting: Family Medicine

## 2018-06-10 DIAGNOSIS — N202 Calculus of kidney with calculus of ureter: Secondary | ICD-10-CM

## 2018-06-10 DIAGNOSIS — R1031 Right lower quadrant pain: Secondary | ICD-10-CM | POA: Diagnosis not present

## 2018-06-10 DIAGNOSIS — I10 Essential (primary) hypertension: Secondary | ICD-10-CM

## 2018-06-10 DIAGNOSIS — K760 Fatty (change of) liver, not elsewhere classified: Secondary | ICD-10-CM

## 2018-06-10 LAB — CBC WITH DIFFERENTIAL/PLATELET
Abs Immature Granulocytes: 0.03 10*3/uL (ref 0.00–0.07)
Basophils Absolute: 0 10*3/uL (ref 0.0–0.1)
Basophils Relative: 0 %
EOS ABS: 0 10*3/uL (ref 0.0–0.5)
EOS PCT: 0 %
HEMATOCRIT: 46.3 % (ref 39.0–52.0)
HEMOGLOBIN: 16.9 g/dL (ref 13.0–17.0)
IMMATURE GRANULOCYTES: 0 %
LYMPHS PCT: 3 %
Lymphs Abs: 0.2 10*3/uL — ABNORMAL LOW (ref 0.7–4.0)
MCH: 32.5 pg (ref 26.0–34.0)
MCHC: 36.5 g/dL — AB (ref 30.0–36.0)
MCV: 89 fL (ref 80.0–100.0)
MONOS PCT: 4 %
Monocytes Absolute: 0.4 10*3/uL (ref 0.1–1.0)
Neutro Abs: 7.7 10*3/uL (ref 1.7–7.7)
Neutrophils Relative %: 93 %
Platelets: 165 10*3/uL (ref 150–400)
RBC: 5.2 MIL/uL (ref 4.22–5.81)
RDW: 12.3 % (ref 11.5–15.5)
WBC: 8.3 10*3/uL (ref 4.0–10.5)
nRBC: 0 % (ref 0.0–0.2)

## 2018-06-10 LAB — COMPREHENSIVE METABOLIC PANEL
ALK PHOS: 69 U/L (ref 38–126)
ALT: 117 U/L — ABNORMAL HIGH (ref 0–44)
AST: 91 U/L — AB (ref 15–41)
Albumin: 4.8 g/dL (ref 3.5–5.0)
Anion gap: 14 (ref 5–15)
BILIRUBIN TOTAL: 3 mg/dL — AB (ref 0.3–1.2)
BUN: 17 mg/dL (ref 6–20)
CALCIUM: 9.2 mg/dL (ref 8.9–10.3)
CO2: 22 mmol/L (ref 22–32)
CREATININE: 1.1 mg/dL (ref 0.61–1.24)
Chloride: 99 mmol/L (ref 98–111)
GFR calc Af Amer: >60 mL/min (ref >60)
GLUCOSE: 400 mg/dL — AB (ref 70–99)
POTASSIUM: 4.9 mmol/L (ref 3.5–5.1)
Sodium: 135 mmol/L (ref 135–145)
TOTAL PROTEIN: 7.3 g/dL (ref 6.5–8.1)

## 2018-06-10 MED ORDER — TAMSULOSIN HCL 0.4 MG PO CAPS: 0.4000 mg | ORAL_CAPSULE | Freq: Every day | ORAL | 0 refills | Status: DC

## 2018-06-10 MED ORDER — ONDANSETRON 4 MG PO TBDP: 4.0000 mg | ORAL_TABLET | Freq: Three times a day (TID) | ORAL | 0 refills | Status: DC | PRN

## 2018-06-10 MED ORDER — HYDROCODONE-ACETAMINOPHEN 5-325 MG PO TABS: 1.0000 | ORAL_TABLET | Freq: Three times a day (TID) | ORAL | 0 refills | Status: DC | PRN

## 2018-06-10 MED ORDER — KETOROLAC TROMETHAMINE 60 MG/2ML IM SOLN
60.0000 mg | Freq: Once | INTRAMUSCULAR | Status: AC
  Administered 2018-06-10: 60 mg via INTRAMUSCULAR

## 2018-06-10 NOTE — Discharge Instructions (Signed)
Rest. ° °Fluids. ° °Medications as prescribed. ° °Take care ° °Dr. Simpson Paulos  °

## 2018-06-10 NOTE — ED Notes (Signed)
CT Renal Study CPT [id number] approved for 06/10/2018-07/09/2018 from [place].

## 2018-06-10 NOTE — ED Provider Notes (Signed)
[facility]    CSN: [id number] Arrival date & time: 06/10/18  9024  History   Chief Complaint Chief Complaint  Patient presents with  . Abdominal Pain    rlq   HPI  52 year old male presents with right lower quadrant pain.  Patient reports severe right lower quadrant pain which started abruptly around 2 AM this morning.  Constant.  Severe.  Patient reports that he has a history of kidney stones.  Associated nausea and vomiting.  No fever.  Patient states that he cannot get comfortable.  No known exacerbating factors.  No other associated symptoms. No other complaints.   PMH, Surgical Hx, Family Hx, Social History reviewed and updated as below.  Past Medical History:  Diagnosis Date  . Arthritis   . Chronic kidney disease    h/o kidney stones  . Complication of anesthesia   . Diabetes mellitus without complication ([place])   . GERD (gastroesophageal reflux disease)    no meds  . Hypertension    pt denies and states that Lisinopril is for kidney protection only from diabetes  . PONV (postoperative nausea and vomiting)    Patient Active Problem List   Diagnosis Date Noted  . Gastroesophageal reflux disease 12/05/2017  . Incarcerated umbilical hernia   . Umbilical hernia 09/73/5329  . Diastasis recti 10/08/2015  . Essential hypertension 04/21/2015  . Type 2 diabetes mellitus without complication, without long-term current use of insulin ([place]) 04/21/2015  . Hyperlipidemia 04/21/2015    Past Surgical History:  Procedure Laterality Date  . KIDNEY STONE SURGERY    . LITHOTRIPSY    . UMBILICAL HERNIA REPAIR N/A 12/20/2015   Procedure: HERNIA REPAIR UMBILICAL ADULT;  Surgeon: Hubbard Robinson, MD;  Location: [facility] ORS;  Service: General;  Laterality: N/A;   Home Medications    Prior to Admission medications   Medication Sig Start Date End Date Taking? Authorizing Provider  aspirin 81 MG tablet Take 1 tablet (81 mg total) by mouth daily. 04/21/15  Yes Juline Patch, MD  cetirizine (ZYRTEC) 10 MG tablet Take 10 mg by mouth daily.   Yes [provider]  Cinnamon 500 MG capsule Take 1,000 mg by mouth daily.   Yes [provider]  glipiZIDE-metformin (METAGLIP) 5-500 MG tablet TAKE ONE TABLET BY MOUTH 2 TIMES DAILY 12/05/17  Yes Juline Patch, MD  lisinopril (PRINIVIL,ZESTRIL) 5 MG tablet TAKE ONE TABLET EVERY MORNING 03/19/18  Yes Juline Patch, MD  metFORMIN (GLUCOPHAGE) 500 MG tablet Take 1 tablet (500 mg total) by mouth 2 (two) times daily with a meal. 12/14/17  Yes Juline Patch, MD  HYDROcodone-acetaminophen (NORCO/VICODIN) 5-325 MG tablet Take 1 tablet by mouth every 8 (eight) hours as needed. 06/10/18   Coral Spikes, DO  ondansetron (ZOFRAN-ODT) 4 MG disintegrating tablet Take 1 tablet (4 mg total) by mouth every 8 (eight) hours as needed for nausea or vomiting. 06/10/18   Coral Spikes, DO  tamsulosin (FLOMAX) 0.4 MG CAPS capsule Take 1 capsule (0.4 mg total) by mouth daily. 06/10/18   Coral Spikes, DO    Family History Family History  Problem Relation Age of Onset  . Hyperlipidemia Mother   . Cancer Father        Pancreatic  . Diabetes Father   . Cancer Paternal Uncle   . Cancer Paternal Grandmother     Social History Social History   Tobacco Use  . Smoking status: Never Smoker  . Smokeless tobacco: Never Used  Substance Use Topics  . Alcohol use: Yes    Alcohol/week: 0.0 standard drinks    Comment: rare  . Drug use: No     Allergies   Patient has no known allergies.   Review of Systems Review of Systems  Constitutional: Negative for fever.  Gastrointestinal: Positive for abdominal pain, nausea and vomiting.   Physical Exam Triage Vital Signs ED Triage Vitals  Enc Vitals Group     BP 06/10/18 0931 130/84     Pulse Rate 06/10/18 0931 (!) 107     Resp 06/10/18 0931 18     Temp 06/10/18 0931 98.3 F (36.8 C)     Temp Source 06/10/18 0931 Oral     SpO2 06/10/18 0931 100 %     Weight  06/10/18 0929 185 lb (83.9 kg)     Height 06/10/18 0929 5\' 6"  (1.676 m)     Head Circumference --      Peak Flow --      Pain Score 06/10/18 0929 6     Pain Loc --      Pain Edu? --      Excl. in [place]? --    Updated Vital Signs BP 130/84 (BP Location: Left Arm)   Pulse (!) 107   Temp 98.3 F (36.8 C) (Oral)   Resp 18   Ht 5\' 6"  (1.676 m)   Wt 83.9 kg   SpO2 100%   BMI 29.86 kg/m   Visual Acuity Right Eye Distance:   Left Eye Distance:   Bilateral Distance:    Right Eye Near:   Left Eye Near:    Bilateral Near:     Physical Exam Vitals signs and nursing note reviewed.  Constitutional:      Comments: Appears in distress secondary to pain.  HENT:     Head: Normocephalic and atraumatic.  Cardiovascular:     Rate and Rhythm: Regular rhythm. Tachycardia present.  Pulmonary:     Effort: Pulmonary effort is normal. No respiratory distress.  Abdominal:     General: There is no distension.     Palpations: Abdomen is soft.     Comments: Minimal tenderness to palpation of the right lower quadrant.  Neurological:     Mental Status: He is alert.  Psychiatric:        Mood and Affect: Mood normal.        Behavior: Behavior normal.    UC Treatments / Results  Labs (all labs ordered are listed, but only abnormal results are displayed) Labs Reviewed  CBC WITH DIFFERENTIAL/PLATELET - Abnormal; Notable for the following components:      Result Value   MCHC 36.5 (*)    Lymphs Abs 0.2 (*)    All other components within normal limits  COMPREHENSIVE METABOLIC PANEL - Abnormal; Notable for the following components:   Glucose, Bld 400 (*)    AST 91 (*)    ALT 117 (*)    Total Bilirubin 3.0 (*)    All other components within normal limits    EKG None  Radiology Ct Renal Stone Study  Result Date: 06/10/2018 CLINICAL DATA:  52 year old with right flank pain. EXAM: CT ABDOMEN AND PELVIS WITHOUT CONTRAST TECHNIQUE: Multidetector CT imaging of the abdomen and pelvis was  performed following the standard protocol without IV contrast. COMPARISON:  02/26/2013 FINDINGS: Lower chest: Lung bases are clear.  No pleural effusions. Hepatobiliary: Diffusely decreased attenuation of the liver is suggestive for steatosis. Normal appearance of the gallbladder. No biliary  dilatation. Pancreas: Unremarkable. No pancreatic ductal dilatation or surrounding inflammatory changes. Spleen: Normal in size without focal abnormality. Adrenals/Urinary Tract: Normal adrenal glands. Urinary bladder is unremarkable. One or two punctate stones in the right kidney. Three punctate calcifications in left kidney upper pole are suggestive for small nonobstructive stones. Negative for hydronephrosis. No ureter stones. Stomach/Bowel: Stomach is within normal limits. Appendix appears normal. No evidence of bowel wall thickening, distention, or inflammatory changes. Vascular/Lymphatic: Atherosclerotic calcifications in the aorta and splenic artery. Negative for an aortic aneurysm. No lymph node enlargement in the abdomen or pelvis. Reproductive: Prostate is unremarkable. Other: Negative for free fluid.  Negative for free air. Musculoskeletal: No acute abnormality. IMPRESSION: 1. Bilateral tiny kidney stones. No hydronephrosis. No ureter stones. 2. Decreased attenuation of the liver. Findings are suggestive for hepatic steatosis. Electronically Signed   By: Markus Daft M.D.   On: 06/10/2018 10:44    Procedures Procedures (including critical care time)  Medications Ordered in UC Medications  ketorolac (TORADOL) injection 60 mg (60 mg Intramuscular Given 06/10/18 0946)    Initial Impression / Assessment and Plan / UC Course  I have reviewed the triage vital signs and the nursing notes.  Pertinent labs & imaging results that were available during my care of the patient were reviewed by me and considered in my medical decision making (see chart for details).    52 year old male presents with severe lower  quadrant pain.  Laboratory studies revealed mildly elevated liver enzymes and a blood sugar of 400.  No evidence of DKA.  CT scan revealed fatty liver as well as kidney stones.  No stones noted in the ureters.  No evidence of appendicitis.  Suspect patient has passed a stone.  Treating with Flomax, Vicodin, and Zofran as needed.  Protection controlled substance database reviewed.  No concerns for abuse at this time.  Final Clinical Impressions(s) / UC Diagnoses   Final diagnoses:  RLQ abdominal pain     Discharge Instructions     Rest. Fluids.  Medications as prescribed.  Take care  Dr. Lacinda Axon     ED Prescriptions    Medication Sig Dispense Auth. Provider   tamsulosin (FLOMAX) 0.4 MG CAPS capsule Take 1 capsule (0.4 mg total) by mouth daily. 14 capsule Zahra Peffley G, DO   HYDROcodone-acetaminophen (NORCO/VICODIN) 5-325 MG tablet Take 1 tablet by mouth every 8 (eight) hours as needed. 10 tablet Cataleia Gade G, DO   ondansetron (ZOFRAN-ODT) 4 MG disintegrating tablet Take 1 tablet (4 mg total) by mouth every 8 (eight) hours as needed for nausea or vomiting. 20 tablet Coral Spikes, DO     Controlled Substance Prescriptions [place] Controlled Substance Registry consulted? Yes, I have consulted the [place] Controlled Substances Registry for this patient, and feel the risk/benefit ratio today is favorable for proceeding with this prescription for a controlled substance.   Coral Spikes, DO 06/10/18 1057

## 2018-06-10 NOTE — ED Triage Notes (Signed)
Patient complains of RLQ pain that started 2 am that has been constant. Patient has been vomiting as well.

## 2018-06-17 ENCOUNTER — Encounter: Payer: Self-pay | Admitting: Family Medicine

## 2018-06-17 ENCOUNTER — Ambulatory Visit: Payer: BLUE CROSS/BLUE SHIELD | Admitting: Family Medicine

## 2018-06-17 VITALS — BP 120/62 | HR 80 | Ht 66.0 in | Wt 186.0 lb

## 2018-06-17 DIAGNOSIS — E119 Type 2 diabetes mellitus without complications: Secondary | ICD-10-CM | POA: Diagnosis not present

## 2018-06-17 DIAGNOSIS — J0110 Acute frontal sinusitis, unspecified: Secondary | ICD-10-CM | POA: Diagnosis not present

## 2018-06-17 DIAGNOSIS — Z6830 Body mass index (BMI) 30.0-30.9, adult: Secondary | ICD-10-CM

## 2018-06-17 MED ORDER — GLIPIZIDE 10 MG PO TABS: 10.0000 mg | ORAL_TABLET | Freq: Two times a day (BID) | ORAL | 3 refills | Status: DC

## 2018-06-17 MED ORDER — AMOXICILLIN-POT CLAVULANATE 875-125 MG PO TABS: 1.0000 | ORAL_TABLET | Freq: Two times a day (BID) | ORAL | 0 refills | Status: DC

## 2018-06-17 MED ORDER — METFORMIN HCL 500 MG PO TABS: 500.0000 mg | ORAL_TABLET | Freq: Two times a day (BID) | ORAL | 1 refills | Status: DC

## 2018-06-17 NOTE — Patient Instructions (Signed)

## 2018-06-17 NOTE — Progress Notes (Addendum)
Date:  06/17/2018   Name:  Kevin Montgomery   DOB:  15-Nov-1965   MRN:  [id number]   Chief Complaint: Sinusitis (pressure in forehead and sneezing, sore throat with yellow production)  Sinusitis  This is a new problem. The current episode started in the past 7 days (2 days ago). The problem has been gradually worsening since onset. There has been no fever. The fever has been present for 1 to 2 days. The pain is mild. Associated symptoms include congestion, coughing, headaches and sinus pressure. Pertinent negatives include no chills, diaphoresis, ear pain, hoarse voice, neck pain, shortness of breath, sneezing, sore throat or swollen glands. Past treatments include acetaminophen and oral decongestants. The treatment provided mild relief.    Review of Systems  Constitutional: Negative for chills, diaphoresis and fever.  HENT: Positive for congestion and sinus pressure. Negative for drooling, ear discharge, ear pain, hoarse voice, sneezing and sore throat.   Respiratory: Positive for cough. Negative for shortness of breath and wheezing.   Cardiovascular: Negative for chest pain, palpitations and leg swelling.  Gastrointestinal: Negative for abdominal pain, blood in stool, constipation, diarrhea and nausea.  Endocrine: Negative for polydipsia.  Genitourinary: Negative for dysuria, frequency, hematuria and urgency.  Musculoskeletal: Negative for back pain, myalgias and neck pain.  Skin: Negative for rash.  Allergic/Immunologic: Negative for environmental allergies.  Neurological: Positive for headaches. Negative for dizziness.  Hematological: Does not bruise/bleed easily.  Psychiatric/Behavioral: Negative for suicidal ideas. The patient is not nervous/anxious.     Patient Active Problem List   Diagnosis Date Noted  . Gastroesophageal reflux disease 12/05/2017  . Incarcerated umbilical hernia   . Umbilical hernia 22/63/3354  . Diastasis recti 10/08/2015  . Essential hypertension  04/21/2015  . Type 2 diabetes mellitus without complication, without long-term current use of insulin ([place]) 04/21/2015  . Hyperlipidemia 04/21/2015    No Known Allergies  Past Surgical History:  Procedure Laterality Date  . KIDNEY STONE SURGERY    . LITHOTRIPSY    . UMBILICAL HERNIA REPAIR N/A 12/20/2015   Procedure: HERNIA REPAIR UMBILICAL ADULT;  Surgeon: Hubbard Robinson, MD;  Location: [facility] ORS;  Service: General;  Laterality: N/A;    Social History   Tobacco Use  . Smoking status: Never Smoker  . Smokeless tobacco: Never Used  Substance Use Topics  . Alcohol use: Yes    Alcohol/week: 0.0 standard drinks    Comment: rare  . Drug use: No     Medication list has been reviewed and updated.  Current Meds  Medication Sig  . aspirin 81 MG tablet Take 1 tablet (81 mg total) by mouth daily.  . cetirizine (ZYRTEC) 10 MG tablet Take 10 mg by mouth daily.  . Cinnamon 500 MG capsule Take 1,000 mg by mouth daily.  Marland Kitchen glipiZIDE-metformin (METAGLIP) 5-500 MG tablet TAKE ONE TABLET BY MOUTH 2 TIMES DAILY  . lisinopril (PRINIVIL,ZESTRIL) 5 MG tablet TAKE ONE TABLET BY MOUTH EVERY MORNING  . metFORMIN (GLUCOPHAGE) 500 MG tablet Take 1 tablet (500 mg total) by mouth 2 (two) times daily with a meal.  . tamsulosin (FLOMAX) 0.4 MG CAPS capsule Take 1 capsule (0.4 mg total) by mouth daily.    PHQ 2/9 Scores 01/30/2017 10/26/2015 07/22/2015  PHQ - 2 Score 0 0 0  PHQ- 9 Score 0 - -    Physical Exam Vitals signs and nursing note reviewed.  HENT:     Head: Normocephalic.     Right Ear: Hearing, tympanic membrane, ear  canal and external ear normal.     Left Ear: Hearing, ear canal and external ear normal.     Nose: Rhinorrhea present.     Right Sinus: Frontal sinus tenderness present. No maxillary sinus tenderness.     Left Sinus: Frontal sinus tenderness present. No maxillary sinus tenderness.     Mouth/Throat:     Lips: Pink.     Mouth: Mucous membranes are moist.     Pharynx: No  posterior oropharyngeal erythema.     Tonsils: No tonsillar exudate or tonsillar abscesses.  Eyes:     General: No scleral icterus.       Right eye: No discharge.        Left eye: No discharge.     Conjunctiva/sclera: Conjunctivae normal.     Pupils: Pupils are equal, round, and reactive to light.  Neck:     Musculoskeletal: Normal range of motion and neck supple.     Thyroid: No thyromegaly.     Vascular: No JVD.     Trachea: No tracheal deviation.  Cardiovascular:     Rate and Rhythm: Normal rate and regular rhythm.     Heart sounds: Normal heart sounds. No murmur. No friction rub. No gallop.   Pulmonary:     Effort: No respiratory distress.     Breath sounds: Normal breath sounds. No wheezing or rales.  Abdominal:     General: Bowel sounds are normal.     Palpations: Abdomen is soft. There is no mass.     Tenderness: There is no abdominal tenderness. There is no guarding or rebound.  Musculoskeletal: Normal range of motion.        General: No tenderness.  Lymphadenopathy:     Cervical: No cervical adenopathy.  Skin:    General: Skin is warm.     Findings: No rash.  Neurological:     Mental Status: He is alert and oriented to person, place, and time.     Cranial Nerves: No cranial nerve deficit.     Deep Tendon Reflexes: Reflexes are normal and symmetric.     BP 120/62   Pulse 80   Ht 5\' 6"  (1.676 m)   Wt 186 lb (84.4 kg)   BMI 30.02 kg/m   Assessment and Plan: 1. BMI 30.0-30.9,adult Health risks of being over weight were discussed and patient was counseled on weight loss options and exercise.  2. Acute frontal sinusitis, recurrence not specified Acute.  Pam consistent with frontal sinusitis will initiate Augmentin 875 twice daily for 10 days. - amoxicillin-clavulanate (AUGMENTIN) 875-125 MG tablet; Take 1 tablet by mouth 2 (two) times daily.  Dispense: 20 tablet; Refill: 0  3. Type 2 diabetes mellitus without complication, without long-term current use of  insulin (HCC) Patient has elevated blood sugars noted in urgent care and here.  Simplify with glipizide 10 mg twice a day and metformin 500 mg twice a day, reemphasized diet and necessity of being carbohydrates and eliminating concentrated sugars. - glipiZIDE (GLUCOTROL) 10 MG tablet; Take 1 tablet (10 mg total) by mouth 2 (two) times daily before a meal.  Dispense: 60 tablet; Refill: 3 - metFORMIN (GLUCOPHAGE) 500 MG tablet; Take 1 tablet (500 mg total) by mouth 2 (two) times daily with a meal.  Dispense: 180 tablet; Refill: 1

## 2018-07-18 ENCOUNTER — Ambulatory Visit: Payer: BLUE CROSS/BLUE SHIELD | Admitting: Family Medicine

## 2018-07-18 ENCOUNTER — Encounter: Payer: Self-pay | Admitting: Family Medicine

## 2018-07-18 VITALS — BP 120/70 | HR 76 | Ht 66.0 in | Wt 186.0 lb

## 2018-07-18 DIAGNOSIS — I10 Essential (primary) hypertension: Secondary | ICD-10-CM | POA: Diagnosis not present

## 2018-07-18 DIAGNOSIS — R350 Frequency of micturition: Secondary | ICD-10-CM | POA: Diagnosis not present

## 2018-07-18 DIAGNOSIS — N401 Enlarged prostate with lower urinary tract symptoms: Secondary | ICD-10-CM

## 2018-07-18 DIAGNOSIS — E119 Type 2 diabetes mellitus without complications: Secondary | ICD-10-CM

## 2018-07-18 MED ORDER — TAMSULOSIN HCL 0.4 MG PO CAPS: 0.4000 mg | ORAL_CAPSULE | Freq: Every day | ORAL | 0 refills | Status: DC

## 2018-07-18 MED ORDER — METFORMIN HCL 500 MG PO TABS: 500.0000 mg | ORAL_TABLET | Freq: Two times a day (BID) | ORAL | 1 refills | Status: DC

## 2018-07-18 MED ORDER — GLIPIZIDE 10 MG PO TABS: 10.0000 mg | ORAL_TABLET | Freq: Two times a day (BID) | ORAL | 3 refills | Status: DC

## 2018-07-18 MED ORDER — LISINOPRIL 5 MG PO TABS: ORAL_TABLET | ORAL | 0 refills | Status: DC

## 2018-07-18 NOTE — Progress Notes (Signed)
Date:  07/18/2018   Name:  Kevin Montgomery   DOB:  [birth year]   MRN:  [id number]   Chief Complaint: Diabetes; Hypertension; and Benign Prostatic Hypertrophy  Diabetes  He presents for his follow-up diabetic visit. He has type 2 diabetes mellitus. His disease course has been stable. There are no hypoglycemic associated symptoms. Pertinent negatives for hypoglycemia include no dizziness, headaches, nervousness/anxiousness or sweats. There are no diabetic associated symptoms. Pertinent negatives for diabetes include no blurred vision, no chest pain and no polydipsia. There are no hypoglycemic complications. Symptoms are stable. There are no diabetic complications. Pertinent negatives for diabetic complications include no CVA, PVD or retinopathy. Risk factors for coronary artery disease include diabetes mellitus and dyslipidemia. Current diabetic treatment includes oral agent (dual therapy). He is compliant with treatment some of the time. He is following a generally healthy diet. He participates in exercise daily. There is no change in his home blood glucose trend. (Does not check) An ACE inhibitor/angiotensin II receptor blocker is being taken. Eye exam is current.  Hypertension  This is a chronic problem. The current episode started more than 1 year ago. The problem has been gradually improving since onset. The problem is controlled. Pertinent negatives include no anxiety, blurred vision, chest pain, headaches, malaise/fatigue, neck pain, orthopnea, palpitations, peripheral edema, PND, shortness of breath or sweats. There are no associated agents to hypertension. Risk factors for coronary artery disease include dyslipidemia, diabetes mellitus and post-menopausal state. Past treatments include ACE inhibitors. The current treatment provides moderate improvement. There are no compliance problems.  There is no history of angina, kidney disease, CAD/MI, CVA, heart failure, left ventricular hypertrophy, PVD or  retinopathy. There is no history of chronic renal disease, a hypertension causing med or renovascular disease.  Benign Prostatic Hypertrophy  This is a chronic problem. The current episode started more than 1 year ago. The problem has been waxing and waning since onset. Irritative symptoms include frequency, nocturia and urgency. Obstructive symptoms include a slower stream. Pertinent negatives include no chills, dysuria, genital pain, hematuria, hesitancy, nausea or vomiting. Past treatments include tamsulosin. The treatment provided moderate relief.    Review of Systems  Constitutional: Negative for chills, fever and malaise/fatigue.  HENT: Negative for drooling, ear discharge, ear pain and sore throat.   Eyes: Negative for blurred vision.  Respiratory: Negative for cough, shortness of breath and wheezing.   Cardiovascular: Negative for chest pain, palpitations, orthopnea, leg swelling and PND.  Gastrointestinal: Negative for abdominal pain, blood in stool, constipation, diarrhea, nausea and vomiting.  Endocrine: Negative for polydipsia.  Genitourinary: Positive for frequency, nocturia and urgency. Negative for dysuria, hematuria and hesitancy.  Musculoskeletal: Negative for back pain, myalgias and neck pain.  Skin: Negative for rash.  Allergic/Immunologic: Negative for environmental allergies.  Neurological: Negative for dizziness and headaches.  Hematological: Does not bruise/bleed easily.  Psychiatric/Behavioral: Negative for suicidal ideas. The patient is not nervous/anxious.     Patient Active Problem List   Diagnosis Date Noted  . Gastroesophageal reflux disease 12/05/2017  . Incarcerated umbilical hernia   . Umbilical hernia 92/33/0076  . Diastasis recti 10/08/2015  . Essential hypertension 04/21/2015  . Type 2 diabetes mellitus without complication, without long-term current use of insulin ([place]) 04/21/2015  . Hyperlipidemia 04/21/2015    No Known Allergies  Past Surgical  History:  Procedure Laterality Date  . KIDNEY STONE SURGERY    . LITHOTRIPSY    . UMBILICAL HERNIA REPAIR N/A 12/20/2015   Procedure: HERNIA REPAIR  UMBILICAL ADULT;  Surgeon: Hubbard Robinson, MD;  Location: [facility] ORS;  Service: General;  Laterality: N/A;    Social History   Tobacco Use  . Smoking status: Never Smoker  . Smokeless tobacco: Never Used  Substance Use Topics  . Alcohol use: Yes    Alcohol/week: 0.0 standard drinks    Comment: rare  . Drug use: No     Medication list has been reviewed and updated.  Current Meds  Medication Sig  . aspirin 81 MG tablet Take 1 tablet (81 mg total) by mouth daily.  . cetirizine (ZYRTEC) 10 MG tablet Take 10 mg by mouth daily.  . Cinnamon 500 MG capsule Take 1,000 mg by mouth daily.  Marland Kitchen glipiZIDE (GLUCOTROL) 10 MG tablet Take 1 tablet (10 mg total) by mouth 2 (two) times daily before a meal.  . lisinopril (PRINIVIL,ZESTRIL) 5 MG tablet TAKE ONE TABLET BY MOUTH EVERY MORNING  . metFORMIN (GLUCOPHAGE) 500 MG tablet Take 1 tablet (500 mg total) by mouth 2 (two) times daily with a meal.  . tamsulosin (FLOMAX) 0.4 MG CAPS capsule Take 1 capsule (0.4 mg total) by mouth daily.    PHQ 2/9 Scores 07/18/2018 01/30/2017 10/26/2015 07/22/2015  PHQ - 2 Score 0 0 0 0  PHQ- 9 Score 0 0 - -    Physical Exam Vitals signs and nursing note reviewed.  HENT:     Head: Normocephalic.     Right Ear: External ear normal.     Left Ear: External ear normal.     Nose: Nose normal.  Eyes:     General: No scleral icterus.       Right eye: No discharge.        Left eye: No discharge.     Conjunctiva/sclera: Conjunctivae normal.     Pupils: Pupils are equal, round, and reactive to light.  Neck:     Musculoskeletal: Normal range of motion and neck supple.     Thyroid: No thyromegaly.     Vascular: No JVD.     Trachea: No tracheal deviation.  Cardiovascular:     Rate and Rhythm: Normal rate and regular rhythm.     Heart sounds: Normal heart sounds. No  murmur. No friction rub. No gallop.   Pulmonary:     Effort: No respiratory distress.     Breath sounds: Normal breath sounds. No wheezing or rales.  Abdominal:     General: Bowel sounds are normal.     Palpations: Abdomen is soft. There is no mass.     Tenderness: There is no abdominal tenderness. There is no guarding or rebound.  Musculoskeletal: Normal range of motion.        General: No tenderness.  Lymphadenopathy:     Cervical: No cervical adenopathy.  Skin:    General: Skin is warm.     Findings: No rash.  Neurological:     Mental Status: He is alert and oriented to person, place, and time.     Cranial Nerves: No cranial nerve deficit.     Deep Tendon Reflexes: Reflexes are normal and symmetric.     BP 120/70   Pulse 76   Ht 5\' 6"  (1.676 m)   Wt 186 lb (84.4 kg)   BMI 30.02 kg/m   Assessment and Plan: 1. Type 2 diabetes mellitus without complication, without long-term current use of insulin (HCC) Chronic.  Controlled.  Will continue metformin 500 mg twice a day and glipizide 10 mg twice a day will check  an A1c and a renal function panel at this time. - metFORMIN (GLUCOPHAGE) 500 MG tablet; Take 1 tablet (500 mg total) by mouth 2 (two) times daily with a meal.  Dispense: 180 tablet; Refill: 1 - glipiZIDE (GLUCOTROL) 10 MG tablet; Take 1 tablet (10 mg total) by mouth 2 (two) times daily before a meal.  Dispense: 60 tablet; Refill: 3 - Hemoglobin A1c - Renal Function Panel  2. Essential hypertension Chronic.  Controlled.  Continue lisinopril 5 mg once a day and will check a renal function panel. - lisinopril (PRINIVIL,ZESTRIL) 5 MG tablet; TAKE ONE TABLET BY MOUTH EVERY MORNING  Dispense: 30 tablet; Refill: 0 - Renal Function Panel  3. Benign prostatic hyperplasia with urinary frequency On tamsulosin for kidney stone.  This is improved patient.  Will continue at this time tamsulosin 0.4 mg daily. - tamsulosin (FLOMAX) 0.4 MG CAPS capsule; Take 1 capsule (0.4 mg total)  by mouth daily.  Dispense: 14 capsule; Refill: 0

## 2018-07-19 ENCOUNTER — Other Ambulatory Visit: Payer: Self-pay

## 2018-07-19 DIAGNOSIS — E119 Type 2 diabetes mellitus without complications: Secondary | ICD-10-CM

## 2018-07-19 LAB — RENAL FUNCTION PANEL
Albumin: 5 g/dL — ABNORMAL HIGH (ref 3.8–4.9)
BUN/Creatinine Ratio: 15 (ref 9–20)
BUN: 13 mg/dL (ref 6–24)
CO2: 22 mmol/L (ref 20–29)
Calcium: 10 mg/dL (ref 8.7–10.2)
Chloride: 97 mmol/L (ref 96–106)
Creatinine, Ser: 0.87 mg/dL (ref 0.76–1.27)
GFR calc Af Amer: 115 mL/min/{1.73_m2} (ref >59)
GFR calc non Af Amer: 99 mL/min/{1.73_m2} (ref >59)
Glucose: 298 mg/dL — ABNORMAL HIGH (ref 65–99)
PHOSPHORUS: 3.4 mg/dL (ref 2.8–4.1)
Potassium: 4.2 mmol/L (ref 3.5–5.2)
Sodium: 139 mmol/L (ref 134–144)

## 2018-07-19 LAB — HEMOGLOBIN A1C
Est. average glucose Bld gHb Est-mCnc: 240 mg/dL
Hgb A1c MFr Bld: 10 % — ABNORMAL HIGH (ref 4.8–5.6)

## 2018-07-19 MED ORDER — EMPAGLIFLOZIN 10 MG PO TABS: 10.0000 mg | ORAL_TABLET | Freq: Every day | ORAL | 1 refills | Status: DC

## 2018-07-19 NOTE — Patient Instructions (Signed)

## 2018-07-19 NOTE — Progress Notes (Unsigned)
Sent in jardiance

## 2018-07-30 ENCOUNTER — Ambulatory Visit: Payer: BLUE CROSS/BLUE SHIELD | Admitting: Family Medicine

## 2018-07-30 ENCOUNTER — Encounter: Payer: Self-pay | Admitting: Family Medicine

## 2018-07-30 VITALS — BP 124/80 | HR 64 | Ht 66.0 in | Wt 183.0 lb

## 2018-07-30 DIAGNOSIS — E119 Type 2 diabetes mellitus without complications: Secondary | ICD-10-CM | POA: Diagnosis not present

## 2018-07-30 DIAGNOSIS — Z794 Long term (current) use of insulin: Secondary | ICD-10-CM

## 2018-07-30 NOTE — Progress Notes (Signed)
Date:  07/30/2018   Name:  Kevin Montgomery   DOB:  1965/10/01   MRN:  [id number]   Chief Complaint: Diabetes  Diabetes  He presents for his follow-up diabetic visit. He has type 2 diabetes mellitus. His disease course has been fluctuating. Pertinent negatives for hypoglycemia include no dizziness, headaches or nervousness/anxiousness. Pertinent negatives for diabetes include no blurred vision, no chest pain, no fatigue, no foot paresthesias, no foot ulcerations, no polydipsia, no polyphagia, no polyuria, no visual change, no weakness and no weight loss.    Review of Systems  Constitutional: Negative for chills, fatigue, fever and weight loss.  HENT: Negative for drooling, ear discharge, ear pain and sore throat.   Eyes: Negative for blurred vision.  Respiratory: Negative for cough, shortness of breath and wheezing.   Cardiovascular: Negative for chest pain, palpitations and leg swelling.  Gastrointestinal: Negative for abdominal pain, blood in stool, constipation, diarrhea and nausea.  Endocrine: Negative for polydipsia, polyphagia and polyuria.  Genitourinary: Negative for dysuria, frequency, hematuria and urgency.  Musculoskeletal: Negative for back pain, myalgias and neck pain.  Skin: Negative for rash.  Allergic/Immunologic: Negative for environmental allergies.  Neurological: Negative for dizziness, weakness and headaches.  Hematological: Does not bruise/bleed easily.  Psychiatric/Behavioral: Negative for suicidal ideas. The patient is not nervous/anxious.     Patient Active Problem List   Diagnosis Date Noted  . Gastroesophageal reflux disease 12/05/2017  . Incarcerated umbilical hernia   . Umbilical hernia 16/94/5038  . Diastasis recti 10/08/2015  . Essential hypertension 04/21/2015  . Type 2 diabetes mellitus without complication, without long-term current use of insulin ([place]) 04/21/2015  . Hyperlipidemia 04/21/2015    No Known Allergies  Past Surgical History:    Procedure Laterality Date  . KIDNEY STONE SURGERY    . LITHOTRIPSY    . UMBILICAL HERNIA REPAIR N/A 12/20/2015   Procedure: HERNIA REPAIR UMBILICAL ADULT;  Surgeon: Hubbard Robinson, MD;  Location: [facility] ORS;  Service: General;  Laterality: N/A;    Social History   Tobacco Use  . Smoking status: Never Smoker  . Smokeless tobacco: Never Used  Substance Use Topics  . Alcohol use: Yes    Alcohol/week: 0.0 standard drinks    Comment: rare  . Drug use: No     Medication list has been reviewed and updated.  Current Meds  Medication Sig  . aspirin 81 MG tablet Take 1 tablet (81 mg total) by mouth daily.  . cetirizine (ZYRTEC) 10 MG tablet Take 10 mg by mouth daily.  . Cinnamon 500 MG capsule Take 1,000 mg by mouth daily.  . empagliflozin (JARDIANCE) 10 MG TABS tablet Take 10 mg by mouth daily.  Marland Kitchen lisinopril (PRINIVIL,ZESTRIL) 5 MG tablet TAKE ONE TABLET BY MOUTH EVERY MORNING  . metFORMIN (GLUCOPHAGE) 500 MG tablet Take 1 tablet (500 mg total) by mouth 2 (two) times daily with a meal.  . tamsulosin (FLOMAX) 0.4 MG CAPS capsule Take 1 capsule (0.4 mg total) by mouth daily.    PHQ 2/9 Scores 07/18/2018 01/30/2017 10/26/2015 07/22/2015  PHQ - 2 Score 0 0 0 0  PHQ- 9 Score 0 0 - -    Physical Exam Vitals signs and nursing note reviewed.  HENT:     Head: Normocephalic.     Right Ear: External ear normal.     Left Ear: External ear normal.     Nose: Nose normal.  Eyes:     General: No scleral icterus.  Right eye: No discharge.        Left eye: No discharge.     Conjunctiva/sclera: Conjunctivae normal.     Pupils: Pupils are equal, round, and reactive to light.  Neck:     Musculoskeletal: Normal range of motion and neck supple.     Thyroid: No thyromegaly.     Vascular: No JVD.     Trachea: No tracheal deviation.  Cardiovascular:     Rate and Rhythm: Normal rate and regular rhythm.     Heart sounds: Normal heart sounds. No murmur. No friction rub. No gallop.   Pulmonary:      Effort: No respiratory distress.     Breath sounds: Normal breath sounds. No stridor. No wheezing, rhonchi or rales.  Abdominal:     General: Bowel sounds are normal.     Palpations: Abdomen is soft. There is no mass.     Tenderness: There is no abdominal tenderness. There is no guarding or rebound.  Musculoskeletal: Normal range of motion.        General: No tenderness.  Lymphadenopathy:     Cervical: No cervical adenopathy.  Skin:    General: Skin is warm.     Capillary Refill: Capillary refill takes less than 2 seconds.     Findings: No rash.  Neurological:     Mental Status: He is alert and oriented to person, place, and time.     Cranial Nerves: No cranial nerve deficit.     Deep Tendon Reflexes: Reflexes are normal and symmetric.     BP 124/80   Pulse 64   Ht 5\' 6"  (1.676 m)   Wt 183 lb (83 kg)   BMI 29.54 kg/m   Assessment and Plan: 1. Type 2 diabetes mellitus without complication, with long-term current use of insulin (HCC) Patient was started on Lantus Solostar 15 units/day and was increased to 12 units today.  This was reviewed with the patient and it was observed that was able to administer without complications.  Patient is to continue Jardiance and metformin and will check blood sugars either fasting or 2 hours after meal.  And is to return in 10 days following a regimen of increasing by 2 units every 3 days if fasting blood sugar is not below 200.  If below 220 but above 200 may increase by 1 unit.

## 2018-07-30 NOTE — Patient Instructions (Signed)
Carbohydrate Counting for Diabetes Mellitus, Adult  Carbohydrate counting is a method of keeping track of how many carbohydrates you eat. Eating carbohydrates naturally increases the amount of sugar (glucose) in the blood. Counting how many carbohydrates you eat helps keep your blood glucose within normal limits, which helps you manage your diabetes (diabetes mellitus). It is important to know how many carbohydrates you can safely have in each meal. This is different for every person. A diet and nutrition specialist (registered dietitian) can help you make a meal plan and calculate how many carbohydrates you should have at each meal and snack. Carbohydrates are found in the following foods:  Grains, such as breads and cereals.  Dried beans and soy products.  Starchy vegetables, such as potatoes, peas, and corn.  Fruit and fruit juices.  Milk and yogurt.  Sweets and snack foods, such as cake, cookies, candy, chips, and soft drinks. How do I count carbohydrates? There are two ways to count carbohydrates in food. You can use either of the methods or a combination of both. Reading "Nutrition Facts" on packaged food The "Nutrition Facts" list is included on the labels of almost all packaged foods and beverages in the [place] It includes:  The serving size.  Information about nutrients in each serving, including the grams (g) of carbohydrate per serving. To use the "Nutrition Facts":  Decide how many servings you will have.  Multiply the number of servings by the number of carbohydrates per serving.  The resulting number is the total amount of carbohydrates that you will be having. Learning standard serving sizes of other foods When you eat carbohydrate foods that are not packaged or do not include "Nutrition Facts" on the label, you need to measure the servings in order to count the amount of carbohydrates:  Measure the foods that you will eat with a food scale or measuring cup, if  needed.  Decide how many standard-size servings you will eat.  Multiply the number of servings by 15. Most carbohydrate-rich foods have about 15 g of carbohydrates per serving. ? For example, if you eat 8 oz (170 g) of strawberries, you will have eaten 2 servings and 30 g of carbohydrates (2 servings x 15 g = 30 g).  For foods that have more than one food mixed, such as soups and casseroles, you must count the carbohydrates in each food that is included. The following list contains standard serving sizes of common carbohydrate-rich foods. Each of these servings has about 15 g of carbohydrates:   hamburger bun or  English muffin.   oz (15 mL) syrup.   oz (14 g) jelly.  1 slice of bread.  1 six-inch tortilla.  3 oz (85 g) cooked rice or pasta.  4 oz (113 g) cooked dried beans.  4 oz (113 g) starchy vegetable, such as peas, corn, or potatoes.  4 oz (113 g) hot cereal.  4 oz (113 g) mashed potatoes or  of a large baked potato.  4 oz (113 g) canned or frozen fruit.  4 oz (120 mL) fruit juice.  4-6 crackers.  6 chicken nuggets.  6 oz (170 g) unsweetened dry cereal.  6 oz (170 g) plain fat-free yogurt or yogurt sweetened with artificial sweeteners.  8 oz (240 mL) milk.  8 oz (170 g) fresh fruit or one small piece of fruit.  24 oz (680 g) popped popcorn. Example of carbohydrate counting Sample meal  3 oz (85 g) chicken breast.  6 oz (170 g)   brown rice.  4 oz (113 g) corn.  8 oz (240 mL) milk.  8 oz (170 g) strawberries with sugar-free whipped topping. Carbohydrate calculation 1. Identify the foods that contain carbohydrates: ? Rice. ? Corn. ? Milk. ? Strawberries. 2. Calculate how many servings you have of each food: ? 2 servings rice. ? 1 serving corn. ? 1 serving milk. ? 1 serving strawberries. 3. Multiply each number of servings by 15 g: ? 2 servings rice x 15 g = 30 g. ? 1 serving corn x 15 g = 15 g. ? 1 serving milk x 15 g = 15 g. ? 1  serving strawberries x 15 g = 15 g. 4. Add together all of the amounts to find the total grams of carbohydrates eaten: ? 30 g + 15 g + 15 g + 15 g = 75 g of carbohydrates total. Summary  Carbohydrate counting is a method of keeping track of how many carbohydrates you eat.  Eating carbohydrates naturally increases the amount of sugar (glucose) in the blood.  Counting how many carbohydrates you eat helps keep your blood glucose within normal limits, which helps you manage your diabetes.  A diet and nutrition specialist (registered dietitian) can help you make a meal plan and calculate how many carbohydrates you should have at each meal and snack. This information is not intended to replace advice given to you by your health care provider. Make sure you discuss any questions you have with your health care provider. Document Released: 05/29/2005 Document Revised: 12/06/2016 Document Reviewed: 11/10/2015 Elsevier Interactive Patient Education  2019 [employer]. Diabetes Mellitus and Nutrition, Adult When you have diabetes (diabetes mellitus), it is very important to have healthy eating habits because your blood sugar (glucose) levels are greatly affected by what you eat and drink. Eating healthy foods in the appropriate amounts, at about the same times every day, can help you:  Control your blood glucose.  Lower your risk of heart disease.  Improve your blood pressure.  Reach or maintain a healthy weight. Every person with diabetes is different, and each person has different needs for a meal plan. Your health care provider may recommend that you work with a diet and nutrition specialist (dietitian) to make a meal plan that is best for you. Your meal plan may vary depending on factors such as:  The calories you need.  The medicines you take.  Your weight.  Your blood glucose, blood pressure, and cholesterol levels.  Your activity level.  Other health conditions you have, such as  heart or kidney disease. How do carbohydrates affect me? Carbohydrates, also called carbs, affect your blood glucose level more than any other type of food. Eating carbs naturally raises the amount of glucose in your blood. Carb counting is a method for keeping track of how many carbs you eat. Counting carbs is important to keep your blood glucose at a healthy level, especially if you use insulin or take certain oral diabetes medicines. It is important to know how many carbs you can safely have in each meal. This is different for every person. Your dietitian can help you calculate how many carbs you should have at each meal and for each snack. Foods that contain carbs include:  Bread, cereal, rice, pasta, and crackers.  Potatoes and corn.  Peas, beans, and lentils.  Milk and yogurt.  Fruit and juice.  Desserts, such as cakes, cookies, ice cream, and candy. How does alcohol affect me? Alcohol can cause a sudden  decrease in blood glucose (hypoglycemia), especially if you use insulin or take certain oral diabetes medicines. Hypoglycemia can be a life-threatening condition. Symptoms of hypoglycemia (sleepiness, dizziness, and confusion) are similar to symptoms of having too much alcohol. If your health care provider says that alcohol is safe for you, follow these guidelines:  Limit alcohol intake to no more than 1 drink per day for nonpregnant women and 2 drinks per day for men. One drink equals 12 oz of beer, 5 oz of wine, or 1 oz of hard liquor.  Do not drink on an empty stomach.  Keep yourself hydrated with water, diet soda, or unsweetened iced tea.  Keep in mind that regular soda, juice, and other mixers may contain a lot of sugar and must be counted as carbs. What are tips for following this plan?  Reading food labels  Start by checking the serving size on the "Nutrition Facts" label of packaged foods and drinks. The amount of calories, carbs, fats, and other nutrients listed on the  label is based on one serving of the item. Many items contain more than one serving per package.  Check the total grams (g) of carbs in one serving. You can calculate the number of servings of carbs in one serving by dividing the total carbs by 15. For example, if a food has 30 g of total carbs, it would be equal to 2 servings of carbs.  Check the number of grams (g) of saturated and trans fats in one serving. Choose foods that have low or no amount of these fats.  Check the number of milligrams (mg) of salt (sodium) in one serving. Most people should limit total sodium intake to less than 2,300 mg per day.  Always check the nutrition information of foods labeled as "low-fat" or "nonfat". These foods may be higher in added sugar or refined carbs and should be avoided.  Talk to your dietitian to identify your daily goals for nutrients listed on the label. Shopping  Avoid buying canned, premade, or processed foods. These foods tend to be high in fat, sodium, and added sugar.  Shop around the outside edge of the grocery store. This includes fresh fruits and vegetables, bulk grains, fresh meats, and fresh dairy. Cooking  Use low-heat cooking methods, such as baking, instead of high-heat cooking methods like deep frying.  Cook using healthy oils, such as olive, canola, or sunflower oil.  Avoid cooking with butter, cream, or high-fat meats. Meal planning  Eat meals and snacks regularly, preferably at the same times every day. Avoid going long periods of time without eating.  Eat foods high in fiber, such as fresh fruits, vegetables, beans, and whole grains. Talk to your dietitian about how many servings of carbs you can eat at each meal.  Eat 4-6 ounces (oz) of lean protein each day, such as lean meat, chicken, fish, eggs, or tofu. One oz of lean protein is equal to: ? 1 oz of meat, chicken, or fish. ? 1 egg. ?  cup of tofu.  Eat some foods each day that contain healthy fats, such as  avocado, nuts, seeds, and fish. Lifestyle  Check your blood glucose regularly.  Exercise regularly as told by your health care provider. This may include: ? 150 minutes of moderate-intensity or vigorous-intensity exercise each week. This could be brisk walking, biking, or water aerobics. ? Stretching and doing strength exercises, such as yoga or weightlifting, at least 2 times a week.  Take medicines as told by  your health care provider.  Do not use any products that contain nicotine or tobacco, such as cigarettes and e-cigarettes. If you need help quitting, ask your health care provider.  Work with a Social worker or diabetes educator to identify strategies to manage stress and any emotional and social challenges. Questions to ask a health care provider  Do I need to meet with a diabetes educator?  Do I need to meet with a dietitian?  What number can I call if I have questions?  When are the best times to check my blood glucose? Where to find more information:  American Diabetes Association: diabetes.org  Academy of Nutrition and Dietetics: www.eatright.[employer] of Diabetes and Digestive and Kidney Diseases (NIH): [employer] Summary  A healthy meal plan will help you control your blood glucose and maintain a healthy lifestyle.  Working with a diet and nutrition specialist (dietitian) can help you make a meal plan that is best for you.  Keep in mind that carbohydrates (carbs) and alcohol have immediate effects on your blood glucose levels. It is important to count carbs and to use alcohol carefully. This information is not intended to replace advice given to you by your health care provider. Make sure you discuss any questions you have with your health care provider. Document Released: 02/23/2005 Document Revised: 12/27/2016 Document Reviewed: 07/03/2016 Elsevier Interactive Patient Education  2019 [place]. Diabetes Basics  Diabetes (diabetes  mellitus) is a long-term (chronic) disease. It occurs when the body does not properly use sugar (glucose) that is released from food after you eat. Diabetes may be caused by one or both of these problems:  Your pancreas does not make enough of a hormone called insulin.  Your body does not react in a normal way to insulin that it makes. Insulin lets sugars (glucose) go into cells in your body. This gives you energy. If you have diabetes, sugars cannot get into cells. This causes high blood sugar (hyperglycemia). Follow these instructions at home: How is diabetes treated? You may need to take insulin or other diabetes medicines daily to keep your blood sugar in balance. Take your diabetes medicines every day as told by your doctor. List your diabetes medicines here: Diabetes medicines  Name of medicine: ______________________________ ? Amount (dose): _______________ Time (a.m./p.m.): _______________ Notes: ___________________________________  Name of medicine: ______________________________ ? Amount (dose): _______________ Time (a.m./p.m.): _______________ Notes: ___________________________________  Name of medicine: ______________________________ ? Amount (dose): _______________ Time (a.m./p.m.): _______________ Notes: ___________________________________ If you use insulin, you will learn how to give yourself insulin by injection. You may need to adjust the amount based on the food that you eat. List the types of insulin you use here: Insulin  Insulin type: ______________________________ ? Amount (dose): _______________ Time (a.m./p.m.): _______________ Notes: ___________________________________  Insulin type: ______________________________ ? Amount (dose): _______________ Time (a.m./p.m.): _______________ Notes: ___________________________________  Insulin type: ______________________________ ? Amount (dose): _______________ Time (a.m./p.m.): _______________ Notes:  ___________________________________  Insulin type: ______________________________ ? Amount (dose): _______________ Time (a.m./p.m.): _______________ Notes: ___________________________________  Insulin type: ______________________________ ? Amount (dose): _______________ Time (a.m./p.m.): _______________ Notes: ___________________________________ How do I manage my blood sugar?  Check your blood sugar levels using a blood glucose monitor as directed by your doctor. Your doctor will set treatment goals for you. Generally, you should have these blood sugar levels:  Before meals (preprandial): 80-130 mg/dL (4.4-7.2 mmol/L).  After meals (postprandial): below 180 mg/dL (10 mmol/L).  A1c level: less than 7%. Write down the times that you will check your blood sugar  levels: Blood sugar checks  Time: _______________ Notes: ___________________________________  Time: _______________ Notes: ___________________________________  Time: _______________ Notes: ___________________________________  Time: _______________ Notes: ___________________________________  Time: _______________ Notes: ___________________________________  Time: _______________ Notes: ___________________________________  What do I need to know about low blood sugar? Low blood sugar is called hypoglycemia. This is when blood sugar is at or below 70 mg/dL (3.9 mmol/L). Symptoms may include:  Feeling: ? Hungry. ? Worried or nervous (anxious). ? Sweaty and clammy. ? Confused. ? Dizzy. ? Sleepy. ? Sick to your stomach (nauseous).  Having: ? A fast heartbeat. ? A headache. ? A change in your vision. ? Tingling or no feeling (numbness) around the mouth, lips, or tongue. ? Jerky movements that you cannot control (seizure).  Having trouble with: ? Moving (coordination). ? Sleeping. ? Passing out (fainting). ? Getting upset easily (irritability). Treating low blood sugar To treat low blood sugar, eat or drink  something sugary right away. If you can think clearly and swallow safely, follow the 15:15 rule:  Take 15 grams of a fast-acting carb (carbohydrate). Talk with your doctor about how much you should take.  Some fast-acting carbs are: ? Sugar tablets (glucose pills). Take 3-4 glucose pills. ? 6-8 pieces of hard candy. ? 4-6 oz (120-150 mL) of fruit juice. ? 4-6 oz (120-150 mL) of regular (not diet) soda. ? 1 Tbsp (15 mL) honey or sugar.  Check your blood sugar 15 minutes after you take the carb.  If your blood sugar is still at or below 70 mg/dL (3.9 mmol/L), take 15 grams of a carb again.  If your blood sugar does not go above 70 mg/dL (3.9 mmol/L) after 3 tries, get help right away.  After your blood sugar goes back to normal, eat a meal or a snack within 1 hour. Treating very low blood sugar If your blood sugar is at or below 54 mg/dL (3 mmol/L), you have very low blood sugar (severe hypoglycemia). This is an emergency. Do not wait to see if the symptoms will go away. Get medical help right away. Call your local emergency services (911 in the [place] Do not drive yourself to the hospital. Questions to ask your health care provider  Do I need to meet with a diabetes educator?  What equipment will I need to care for myself at home?  What diabetes medicines do I need? When should I take them?  How often do I need to check my blood sugar?  What number can I call if I have questions?  When is my next doctor's visit?  Where can I find a support group for people with diabetes? Where to find more information  American Diabetes Association: www.diabetes.org  American Association of Diabetes Educators: www.diabeteseducator.org/patient-resources Contact a doctor if:  Your blood sugar is at or above 240 mg/dL (13.3 mmol/L) for 2 days in a row.  You have been sick or have had a fever for 2 days or more, and you are not getting better.  You have any of these problems for more than 6  hours: ? You cannot eat or drink. ? You feel sick to your stomach (nauseous). ? You throw up (vomit). ? You have watery poop (diarrhea). Get help right away if:  Your blood sugar is lower than 54 mg/dL (3 mmol/L).  You get confused.  You have trouble: ? Thinking clearly. ? Breathing. Summary  Diabetes (diabetes mellitus) is a long-term (chronic) disease. It occurs when the body does not properly use sugar (glucose)  that is released from food after digestion.  Take insulin and diabetes medicines as told.  Check your blood sugar every day, as often as told.  Keep all follow-up visits as told by your doctor. This is important. This information is not intended to replace advice given to you by your health care provider. Make sure you discuss any questions you have with your health care provider. Document Released: 08/31/2017 Document Revised: 11/19/2017 Document Reviewed: 08/31/2017 Elsevier Interactive Patient Education  2019 [employer].

## 2018-08-12 ENCOUNTER — Ambulatory Visit: Payer: BLUE CROSS/BLUE SHIELD | Admitting: Family Medicine

## 2018-08-12 ENCOUNTER — Other Ambulatory Visit: Payer: Self-pay

## 2018-08-12 ENCOUNTER — Encounter: Payer: Self-pay | Admitting: Family Medicine

## 2018-08-12 VITALS — BP 117/74 | HR 84 | Resp 16 | Ht 66.0 in | Wt 180.0 lb

## 2018-08-12 DIAGNOSIS — E119 Type 2 diabetes mellitus without complications: Secondary | ICD-10-CM | POA: Diagnosis not present

## 2018-08-12 DIAGNOSIS — Z794 Long term (current) use of insulin: Secondary | ICD-10-CM | POA: Diagnosis not present

## 2018-08-12 LAB — GLUCOSE, POCT (MANUAL RESULT ENTRY): POC Glucose: 212 mg/dl — AB (ref 70–99)

## 2018-08-12 MED ORDER — "INSULIN SYRINGE-NEEDLE U-100 31G X 15/64"" 0.5 ML MISC": 16.0000 [IU]/d | Freq: Every evening | 2 refills | Status: DC

## 2018-08-12 NOTE — Progress Notes (Signed)
Date:  08/12/2018   Name:  Kevin Montgomery   DOB:  [birth year]   MRN:  [id number]   Chief Complaint: Diabetes (Insulin is at 14 now. average 218-240 some between 165-195)  Diabetes  He presents for his follow-up diabetic visit. He has type 2 diabetes mellitus. His disease course has been stable. There are no hypoglycemic associated symptoms. Pertinent negatives for hypoglycemia include no dizziness, headaches or nervousness/anxiousness. There are no diabetic associated symptoms. Pertinent negatives for diabetes include no chest pain and no polydipsia. There are no hypoglycemic complications. Symptoms are stable. There are no diabetic complications. There are no known risk factors for coronary artery disease. Current diabetic treatment includes insulin injections. He is compliant with treatment some of the time. He is following a generally healthy diet. His breakfast blood glucose is taken between 8-9 am. His breakfast blood glucose range is generally >200 mg/dl. (200-220)    Review of Systems  Constitutional: Negative for chills and fever.  HENT: Negative for drooling, ear discharge, ear pain and sore throat.   Respiratory: Negative for cough, shortness of breath and wheezing.   Cardiovascular: Negative for chest pain, palpitations and leg swelling.  Gastrointestinal: Negative for abdominal pain, blood in stool, constipation, diarrhea and nausea.  Endocrine: Negative for polydipsia.  Genitourinary: Negative for dysuria, frequency, hematuria and urgency.  Musculoskeletal: Negative for back pain, myalgias and neck pain.  Skin: Negative for rash.  Allergic/Immunologic: Negative for environmental allergies.  Neurological: Negative for dizziness and headaches.  Hematological: Does not bruise/bleed easily.  Psychiatric/Behavioral: Negative for suicidal ideas. The patient is not nervous/anxious.     Patient Active Problem List   Diagnosis Date Noted  . Gastroesophageal reflux disease  12/05/2017  . Incarcerated umbilical hernia   . Umbilical hernia 41/93/7902  . Diastasis recti 10/08/2015  . Essential hypertension 04/21/2015  . Type 2 diabetes mellitus without complication, without long-term current use of insulin ([place]) 04/21/2015  . Hyperlipidemia 04/21/2015    No Known Allergies  Past Surgical History:  Procedure Laterality Date  . KIDNEY STONE SURGERY    . LITHOTRIPSY    . UMBILICAL HERNIA REPAIR N/A 12/20/2015   Procedure: HERNIA REPAIR UMBILICAL ADULT;  Surgeon: Hubbard Robinson, MD;  Location: [facility] ORS;  Service: General;  Laterality: N/A;    Social History   Tobacco Use  . Smoking status: Never Smoker  . Smokeless tobacco: Never Used  Substance Use Topics  . Alcohol use: Yes    Alcohol/week: 0.0 standard drinks    Comment: rare  . Drug use: No     Medication list has been reviewed and updated.  Current Meds  Medication Sig  . aspirin 81 MG tablet Take 1 tablet (81 mg total) by mouth daily.  . cetirizine (ZYRTEC) 10 MG tablet Take 10 mg by mouth daily.  . Cinnamon 500 MG capsule Take 1,000 mg by mouth daily.  . empagliflozin (JARDIANCE) 10 MG TABS tablet Take 10 mg by mouth daily.  Marland Kitchen lisinopril (PRINIVIL,ZESTRIL) 5 MG tablet TAKE ONE TABLET BY MOUTH EVERY MORNING  . metFORMIN (GLUCOPHAGE) 500 MG tablet Take 1 tablet (500 mg total) by mouth 2 (two) times daily with a meal.  . tamsulosin (FLOMAX) 0.4 MG CAPS capsule Take 1 capsule (0.4 mg total) by mouth daily.    PHQ 2/9 Scores 07/18/2018 01/30/2017 10/26/2015 07/22/2015  PHQ - 2 Score 0 0 0 0  PHQ- 9 Score 0 0 - -    Physical Exam Vitals signs and nursing note  reviewed.  HENT:     Head: Normocephalic.     Right Ear: Tympanic membrane, ear canal and external ear normal.     Left Ear: Tympanic membrane, ear canal and external ear normal.     Nose: Nose normal.  Eyes:     General: No scleral icterus.       Right eye: No discharge.        Left eye: No discharge.     Conjunctiva/sclera:  Conjunctivae normal.     Pupils: Pupils are equal, round, and reactive to light.  Neck:     Musculoskeletal: Normal range of motion and neck supple.     Thyroid: No thyromegaly.     Vascular: No JVD.     Trachea: No tracheal deviation.  Cardiovascular:     Rate and Rhythm: Normal rate and regular rhythm.     Chest Wall: PMI is not displaced.     Pulses: Normal pulses.          Carotid pulses are 2+ on the right side and 2+ on the left side.      Radial pulses are 2+ on the right side and 2+ on the left side.       Femoral pulses are 2+ on the right side and 2+ on the left side.      Popliteal pulses are 2+ on the right side and 2+ on the left side.       Dorsalis pedis pulses are 2+ on the right side and 2+ on the left side.       Posterior tibial pulses are 2+ on the right side and 2+ on the left side.     Heart sounds: Normal heart sounds, S1 normal and S2 normal. Heart sounds not distant. No murmur. No systolic murmur. No diastolic murmur. No friction rub. No gallop. No S3 or S4 sounds.   Pulmonary:     Effort: No respiratory distress.     Breath sounds: Normal breath sounds. No wheezing or rales.  Abdominal:     General: Bowel sounds are normal.     Palpations: Abdomen is soft. There is no mass.     Tenderness: There is no abdominal tenderness. There is no guarding or rebound.  Musculoskeletal: Normal range of motion.        General: No tenderness.  Lymphadenopathy:     Cervical: No cervical adenopathy.  Skin:    General: Skin is warm.     Findings: No rash.  Neurological:     Mental Status: He is alert and oriented to person, place, and time.     Cranial Nerves: No cranial nerve deficit.     Deep Tendon Reflexes: Reflexes are normal and symmetric.     BP 117/74   Pulse 84   Resp 16   Ht 5\' 6"  (1.676 m)   Wt 180 lb (81.6 kg)   BMI 29.05 kg/m   Assessment and Plan: 1. Type 2 diabetes mellitus without complication, with long-term current use of insulin (HCC) New  insulin  onset.  Has been on basal insulin with gradual increase to 16 units.  And is been using only 14 units and blood sugars are consistently over 200.  Patient is to gradually increase to 16 and 18 and then pending A1c and fasting and 2-hour PC glucose will proceed to 20 units and will be rechecked in 6 weeks - POCT Glucose (CBG) - Insulin Syringe-Needle U-100 31G X 15/64" 0.5 ML MISC; 16 Units/day by  Does not apply route Nightly.  Dispense: 90 each; Refill: 2

## 2018-08-16 ENCOUNTER — Ambulatory Visit (INDEPENDENT_AMBULATORY_CARE_PROVIDER_SITE_OTHER): Payer: BLUE CROSS/BLUE SHIELD | Admitting: Family Medicine

## 2018-08-16 ENCOUNTER — Encounter: Payer: Self-pay | Admitting: Family Medicine

## 2018-08-16 ENCOUNTER — Telehealth: Payer: Self-pay

## 2018-08-16 ENCOUNTER — Other Ambulatory Visit: Payer: Self-pay

## 2018-08-16 VITALS — BP 120/80 | HR 80 | Ht 66.0 in | Wt 180.0 lb

## 2018-08-16 DIAGNOSIS — N401 Enlarged prostate with lower urinary tract symptoms: Secondary | ICD-10-CM

## 2018-08-16 DIAGNOSIS — Z0000 Encounter for general adult medical examination without abnormal findings: Secondary | ICD-10-CM

## 2018-08-16 DIAGNOSIS — R350 Frequency of micturition: Secondary | ICD-10-CM

## 2018-08-16 DIAGNOSIS — Z1211 Encounter for screening for malignant neoplasm of colon: Secondary | ICD-10-CM | POA: Diagnosis not present

## 2018-08-16 DIAGNOSIS — Z23 Encounter for immunization: Secondary | ICD-10-CM

## 2018-08-16 MED ORDER — TAMSULOSIN HCL 0.4 MG PO CAPS: 0.4000 mg | ORAL_CAPSULE | Freq: Every day | ORAL | 1 refills | Status: DC

## 2018-08-16 NOTE — Telephone Encounter (Signed)
Gastroenterology Pre-Procedure Review  Request Date: 09/09/18 Requesting Physician: Dr. Allen Norris  PATIENT REVIEW QUESTIONS: The patient responded to the following health history questions as indicated:    1. Are you having any GI issues? no 2. Do you have a personal history of Polyps? no 3. Do you have a family history of Colon Cancer or Polyps? no 4. Diabetes Mellitus?yes 5. Joint replacements in the past 12 months?no 6. Major health problems in the past 3 months?no 7. Any artificial heart valves, MVP, or defibrillator?no    MEDICATIONS & ALLERGIES:    Patient reports the following regarding taking any anticoagulation/antiplatelet therapy:   Plavix, Coumadin, Eliquis, Xarelto, Lovenox, Pradaxa, Brilinta, or Effient? no Aspirin? no  Patient confirms/reports the following medications:  Current Outpatient Medications  Medication Sig Dispense Refill  . aspirin 81 MG tablet Take 1 tablet (81 mg total) by mouth daily. 30 tablet 11  . Cinnamon 500 MG capsule Take 1,000 mg by mouth daily.    . empagliflozin (JARDIANCE) 10 MG TABS tablet Take 10 mg by mouth daily. 30 tablet 1  . Insulin Syringe-Needle U-100 31G X 15/64" 0.5 ML MISC 16 Units/day by Does not apply route Nightly. (Patient taking differently: 17 Units/day by Does not apply route Nightly. ) 90 each 2  . lisinopril (PRINIVIL,ZESTRIL) 5 MG tablet TAKE ONE TABLET BY MOUTH EVERY MORNING 30 tablet 0  . metFORMIN (GLUCOPHAGE) 500 MG tablet Take 1 tablet (500 mg total) by mouth 2 (two) times daily with a meal. 180 tablet 1  . tamsulosin (FLOMAX) 0.4 MG CAPS capsule Take 1 capsule (0.4 mg total) by mouth daily. 90 capsule 1   No current facility-administered medications for this visit.     Patient confirms/reports the following allergies:  No Known Allergies  No orders of the defined types were placed in this encounter.   AUTHORIZATION INFORMATION Primary Insurance: 1D#: Group #:  Secondary Insurance: 1D#: Group #:  SCHEDULE  INFORMATION: Date: 09/09/18 Time: Location:[facility]

## 2018-08-16 NOTE — Progress Notes (Signed)
Date:  08/16/2018   Name:  Kevin Montgomery   DOB:  [birth year]   MRN:  [id number]   Chief Complaint: Annual Exam; Colonoscopy; and pneum 23  Patient is a 53 year old male who presents for a comprehensive physical exam. The patient reports the following problems: see diabetes. Health maintenance has been reviewed up to date/immunizations done.   Review of Systems  Constitutional: Negative for appetite change, chills, fatigue, fever and unexpected weight change.  HENT: Negative for drooling, ear discharge, ear pain, facial swelling, hearing loss, nosebleeds, sneezing, sore throat and trouble swallowing.   Eyes: Negative for photophobia, pain, discharge, redness, itching and visual disturbance.  Respiratory: Negative for cough, choking, chest tightness, shortness of breath and wheezing.   Cardiovascular: Negative for chest pain, palpitations and leg swelling.  Gastrointestinal: Negative for abdominal pain, blood in stool, constipation, diarrhea, nausea, rectal pain and vomiting.  Endocrine: Negative for cold intolerance, heat intolerance, polydipsia, polyphagia and polyuria.  Genitourinary: Negative for decreased urine volume, difficulty urinating, discharge, dysuria, flank pain, frequency, hematuria, penile pain, penile swelling, scrotal swelling, testicular pain and urgency.  Musculoskeletal: Negative for back pain, joint swelling, myalgias, neck pain and neck stiffness.  Skin: Negative for color change and rash.  Allergic/Immunologic: Negative for environmental allergies and immunocompromised state.  Neurological: Negative for dizziness, tremors, seizures, syncope, speech difficulty, weakness, light-headedness, numbness and headaches.  Hematological: Does not bruise/bleed easily.  Psychiatric/Behavioral: Negative for agitation, behavioral problems, confusion, dysphoric mood, hallucinations, self-injury and suicidal ideas. The patient is not nervous/anxious.     Patient Active Problem List    Diagnosis Date Noted  . Gastroesophageal reflux disease 12/05/2017  . Incarcerated umbilical hernia   . Umbilical hernia 40/98/1191  . Diastasis recti 10/08/2015  . Essential hypertension 04/21/2015  . Type 2 diabetes mellitus without complication, without long-term current use of insulin ([place]) 04/21/2015  . Hyperlipidemia 04/21/2015    No Known Allergies  Past Surgical History:  Procedure Laterality Date  . KIDNEY STONE SURGERY    . LITHOTRIPSY    . UMBILICAL HERNIA REPAIR N/A 12/20/2015   Procedure: HERNIA REPAIR UMBILICAL ADULT;  Surgeon: Hubbard Robinson, MD;  Location: [facility] ORS;  Service: General;  Laterality: N/A;    Social History   Tobacco Use  . Smoking status: Never Smoker  . Smokeless tobacco: Never Used  Substance Use Topics  . Alcohol use: Yes    Alcohol/week: 0.0 standard drinks    Comment: rare  . Drug use: No     Medication list has been reviewed and updated.  Current Meds  Medication Sig  . aspirin 81 MG tablet Take 1 tablet (81 mg total) by mouth daily.  . Cinnamon 500 MG capsule Take 1,000 mg by mouth daily.  . empagliflozin (JARDIANCE) 10 MG TABS tablet Take 10 mg by mouth daily.  . Insulin Syringe-Needle U-100 31G X 15/64" 0.5 ML MISC 16 Units/day by Does not apply route Nightly. (Patient taking differently: 17 Units/day by Does not apply route Nightly. )  . lisinopril (PRINIVIL,ZESTRIL) 5 MG tablet TAKE ONE TABLET BY MOUTH EVERY MORNING  . metFORMIN (GLUCOPHAGE) 500 MG tablet Take 1 tablet (500 mg total) by mouth 2 (two) times daily with a meal.  . tamsulosin (FLOMAX) 0.4 MG CAPS capsule Take 1 capsule (0.4 mg total) by mouth daily.    PHQ 2/9 Scores 07/18/2018 01/30/2017 10/26/2015 07/22/2015  PHQ - 2 Score 0 0 0 0  PHQ- 9 Score 0 0 - -  Physical Exam Vitals signs and nursing note reviewed.  Constitutional:      Appearance: He is overweight.  HENT:     Head: Normocephalic.     Jaw: There is normal jaw occlusion.     Right Ear: Hearing,  tympanic membrane, ear canal and external ear normal.     Left Ear: Hearing, tympanic membrane, ear canal and external ear normal.     Nose: Nose normal.     Right Turbinates: Not enlarged.     Left Turbinates: Not enlarged.     Mouth/Throat:     Lips: Pink.     Mouth: Mucous membranes are moist.  Eyes:     General: Lids are normal. No scleral icterus.       Right eye: No foreign body or discharge.        Left eye: No foreign body or discharge.     Extraocular Movements: Extraocular movements intact.     Conjunctiva/sclera: Conjunctivae normal.     Pupils: Pupils are equal, round, and reactive to light.  Neck:     Musculoskeletal: Full passive range of motion without pain, normal range of motion and neck supple.     Thyroid: No thyroid mass, thyromegaly or thyroid tenderness.     Vascular: No JVD.     Trachea: No tracheal deviation.  Cardiovascular:     Rate and Rhythm: Normal rate and regular rhythm. Frequent extrasystoles are present.    Chest Wall: PMI is not displaced. No thrill.     Pulses: Normal pulses.          Carotid pulses are 2+ on the right side and 2+ on the left side.      Radial pulses are 2+ on the right side and 2+ on the left side.       Femoral pulses are 2+ on the right side and 2+ on the left side.      Popliteal pulses are 2+ on the right side and 2+ on the left side.       Dorsalis pedis pulses are 2+ on the right side and 2+ on the left side.       Posterior tibial pulses are 2+ on the right side and 2+ on the left side.     Heart sounds: Normal heart sounds, S1 normal and S2 normal. No murmur. No systolic murmur. No diastolic murmur. No friction rub. No gallop. No S3 sounds.   Pulmonary:     Effort: No respiratory distress.     Breath sounds: Normal breath sounds. No decreased air movement or transmitted upper airway sounds. No wheezing, rhonchi or rales.  Chest:     Chest wall: No mass.     Breasts:        Right: Normal. No swelling, bleeding, inverted  nipple or mass.        Left: Normal. No swelling, bleeding, inverted nipple or mass.  Abdominal:     General: Bowel sounds are normal.     Palpations: Abdomen is soft. There is no hepatomegaly, splenomegaly or mass.     Tenderness: There is no abdominal tenderness. There is no guarding or rebound.  Genitourinary:    Pubic Area: No rash.      Penis: Normal.      Scrotum/Testes: Normal.        Right: Mass not present.        Left: Mass not present.     Epididymis:     Right: Normal.  Prostate: Normal. Not enlarged, not tender and no nodules present.     Rectum: Normal. Guaiac result negative.  Musculoskeletal: Normal range of motion.        General: No tenderness.     Right lower leg: No edema.     Left lower leg: No edema.  Lymphadenopathy:     Head:     Right side of head: No submental or submandibular adenopathy.     Left side of head: No submental or submandibular adenopathy.     Cervical: No cervical adenopathy.     Right cervical: No superficial cervical adenopathy.    Left cervical: No superficial cervical adenopathy.     Upper Body:     Right upper body: No supraclavicular adenopathy.     Left upper body: No supraclavicular adenopathy.  Skin:    General: Skin is warm.     Capillary Refill: Capillary refill takes less than 2 seconds.     Coloration: Skin is not cyanotic, jaundiced or pale.     Findings: No rash.  Neurological:     Mental Status: He is alert and oriented to person, place, and time.     Cranial Nerves: Cranial nerves are intact. No cranial nerve deficit.     Sensory: Sensation is intact.     Motor: Motor function is intact.     Coordination: Coordination is intact.     Deep Tendon Reflexes: Reflexes are normal and symmetric.     Reflex Scores:      Tricep reflexes are 2+ on the right side and 2+ on the left side.      Bicep reflexes are 2+ on the right side and 2+ on the left side.      Brachioradialis reflexes are 2+ on the right side and 2+ on the  left side.      Patellar reflexes are 2+ on the right side and 2+ on the left side.      Achilles reflexes are 2+ on the right side and 2+ on the left side. Psychiatric:        Behavior: Behavior is cooperative.     BP 120/80   Pulse 80   Ht 5\' 6"  (1.676 m)   Wt 180 lb (81.6 kg)   BMI 29.05 kg/m   Assessment and Plan: 1. Annual physical exam No subjective/objective concerns noted on history and physical and review of systems.  Patient's chart was reviewed for previous visits labs, imaging, referrals,.  2. Benign prostatic hyperplasia with urinary frequency And has history of BPH for which he takes tamsulosin 0.4 mg daily.  Will check PSA to evaluate for any change.  Exam today with DRE is unremarkable for enlargement change in consistency or nodularity. - tamsulosin (FLOMAX) 0.4 MG CAPS capsule; Take 1 capsule (0.4 mg total) by mouth daily.  Dispense: 90 capsule; Refill: 1 - PSA  3. Colon cancer screening Guaiac was negative and colonoscopy is up-to-date - Ambulatory referral to Gastroenterology  4. Need for pneumococcal vaccination Discussed and administered. - Pneumococcal polysaccharide vaccine 23-valent greater than or equal to 2yo subcutaneous/IM

## 2018-08-16 NOTE — Patient Instructions (Signed)
DASH Eating Plan  DASH stands for "Dietary Approaches to Stop Hypertension." The DASH eating plan is a healthy eating plan that has been shown to reduce high blood pressure (hypertension). It may also reduce your risk for type 2 diabetes, heart disease, and stroke. The DASH eating plan may also help with weight loss.  What are tips for following this plan?    General guidelines   Avoid eating more than 2,300 mg (milligrams) of salt (sodium) a day. If you have hypertension, you may need to reduce your sodium intake to 1,500 mg a day.   Limit alcohol intake to no more than 1 drink a day for nonpregnant women and 2 drinks a day for men. One drink equals 12 oz of beer, 5 oz of wine, or 1 oz of hard liquor.   Work with your health care provider to maintain a healthy body weight or to lose weight. Ask what an ideal weight is for you.   Get at least 30 minutes of exercise that causes your heart to beat faster (aerobic exercise) most days of the week. Activities may include walking, swimming, or biking.   Work with your health care provider or diet and nutrition specialist (dietitian) to adjust your eating plan to your individual calorie needs.  Reading food labels     Check food labels for the amount of sodium per serving. Choose foods with less than 5 percent of the Daily Value of sodium. Generally, foods with less than 300 mg of sodium per serving fit into this eating plan.   To find whole grains, look for the word "whole" as the first word in the ingredient list.  Shopping   Buy products labeled as "low-sodium" or "no salt added."   Buy fresh foods. Avoid canned foods and premade or frozen meals.  Cooking   Avoid adding salt when cooking. Use salt-free seasonings or herbs instead of table salt or sea salt. Check with your health care provider or pharmacist before using salt substitutes.   Do not fry foods. Cook foods using healthy methods such as baking, boiling, grilling, and broiling instead.   Cook with  heart-healthy oils, such as olive, canola, soybean, or sunflower oil.  Meal planning   Eat a balanced diet that includes:  ? 5 or more servings of fruits and vegetables each day. At each meal, try to fill half of your plate with fruits and vegetables.  ? Up to 6-8 servings of whole grains each day.  ? Less than 6 oz of lean meat, poultry, or fish each day. A 3-oz serving of meat is about the same size as a deck of cards. One egg equals 1 oz.  ? 2 servings of low-fat dairy each day.  ? A serving of nuts, seeds, or beans 5 times each week.  ? Heart-healthy fats. Healthy fats called Omega-3 fatty acids are found in foods such as flaxseeds and coldwater fish, like sardines, salmon, and mackerel.   Limit how much you eat of the following:  ? Canned or prepackaged foods.  ? Food that is high in trans fat, such as fried foods.  ? Food that is high in saturated fat, such as fatty meat.  ? Sweets, desserts, sugary drinks, and other foods with added sugar.  ? Full-fat dairy products.   Do not salt foods before eating.   Try to eat at least 2 vegetarian meals each week.   Eat more home-cooked food and less restaurant, buffet, and fast food.     When eating at a restaurant, ask that your food be prepared with less salt or no salt, if possible.  What foods are recommended?  The items listed may not be a complete list. Talk with your dietitian about what dietary choices are best for you.  Grains  Whole-grain or whole-wheat bread. Whole-grain or whole-wheat pasta. Brown rice. Oatmeal. Quinoa. Bulgur. Whole-grain and low-sodium cereals. Pita bread. Low-fat, low-sodium crackers. Whole-wheat flour tortillas.  Vegetables  Fresh or frozen vegetables (raw, steamed, roasted, or grilled). Low-sodium or reduced-sodium tomato and vegetable juice. Low-sodium or reduced-sodium tomato sauce and tomato paste. Low-sodium or reduced-sodium canned vegetables.  Fruits  All fresh, dried, or frozen fruit. Canned fruit in natural juice (without  added sugar).  Meat and other protein foods  Skinless chicken or turkey. Ground chicken or turkey. Pork with fat trimmed off. Fish and seafood. Egg whites. Dried beans, peas, or lentils. Unsalted nuts, nut butters, and seeds. Unsalted canned beans. Lean cuts of beef with fat trimmed off. Low-sodium, lean deli meat.  Dairy  Low-fat (1%) or fat-free (skim) milk. Fat-free, low-fat, or reduced-fat cheeses. Nonfat, low-sodium ricotta or cottage cheese. Low-fat or nonfat yogurt. Low-fat, low-sodium cheese.  Fats and oils  Soft margarine without trans fats. Vegetable oil. Low-fat, reduced-fat, or light mayonnaise and salad dressings (reduced-sodium). Canola, safflower, olive, soybean, and sunflower oils. Avocado.  Seasoning and other foods  Herbs. Spices. Seasoning mixes without salt. Unsalted popcorn and pretzels. Fat-free sweets.  What foods are not recommended?  The items listed may not be a complete list. Talk with your dietitian about what dietary choices are best for you.  Grains  Baked goods made with fat, such as croissants, muffins, or some breads. Dry pasta or rice meal packs.  Vegetables  Creamed or fried vegetables. Vegetables in a cheese sauce. Regular canned vegetables (not low-sodium or reduced-sodium). Regular canned tomato sauce and paste (not low-sodium or reduced-sodium). Regular tomato and vegetable juice (not low-sodium or reduced-sodium). Pickles. Olives.  Fruits  Canned fruit in a light or heavy syrup. Fried fruit. Fruit in cream or butter sauce.  Meat and other protein foods  Fatty cuts of meat. Ribs. Fried meat. Bacon. Sausage. Bologna and other processed lunch meats. Salami. Fatback. Hotdogs. Bratwurst. Salted nuts and seeds. Canned beans with added salt. Canned or smoked fish. Whole eggs or egg yolks. Chicken or turkey with skin.  Dairy  Whole or 2% milk, cream, and half-and-half. Whole or full-fat cream cheese. Whole-fat or sweetened yogurt. Full-fat cheese. Nondairy creamers. Whipped toppings.  Processed cheese and cheese spreads.  Fats and oils  Butter. Stick margarine. Lard. Shortening. Ghee. Bacon fat. Tropical oils, such as coconut, palm kernel, or palm oil.  Seasoning and other foods  Salted popcorn and pretzels. Onion salt, garlic salt, seasoned salt, table salt, and sea salt. Worcestershire sauce. Tartar sauce. Barbecue sauce. Teriyaki sauce. Soy sauce, including reduced-sodium. Steak sauce. Canned and packaged gravies. Fish sauce. Oyster sauce. Cocktail sauce. Horseradish that you find on the shelf. Ketchup. Mustard. Meat flavorings and tenderizers. Bouillon cubes. Hot sauce and Tabasco sauce. Premade or packaged marinades. Premade or packaged taco seasonings. Relishes. Regular salad dressings.  Where to find more information:   National Heart, Lung, and Blood Institute: www.nhlbi.nih.gov   American Heart Association: www.heart.org  Summary   The DASH eating plan is a healthy eating plan that has been shown to reduce high blood pressure (hypertension). It may also reduce your risk for type 2 diabetes, heart disease, and stroke.   With the   DASH eating plan, you should limit salt (sodium) intake to 2,300 mg a day. If you have hypertension, you may need to reduce your sodium intake to 1,500 mg a day.   When on the DASH eating plan, aim to eat more fresh fruits and vegetables, whole grains, lean proteins, low-fat dairy, and heart-healthy fats.   Work with your health care provider or diet and nutrition specialist (dietitian) to adjust your eating plan to your individual calorie needs.  This information is not intended to replace advice given to you by your health care provider. Make sure you discuss any questions you have with your health care provider.  Document Released: 05/18/2011 Document Revised: 05/22/2016 Document Reviewed: 05/22/2016  Elsevier Interactive Patient Education  2019 [employer]

## 2018-08-17 LAB — PSA: Prostate Specific Ag, Serum: 0.2 ng/mL (ref 0.0–4.0)

## 2018-08-19 ENCOUNTER — Other Ambulatory Visit: Payer: Self-pay

## 2018-08-19 DIAGNOSIS — J019 Acute sinusitis, unspecified: Secondary | ICD-10-CM

## 2018-08-19 MED ORDER — AZITHROMYCIN 250 MG PO TABS: ORAL_TABLET | ORAL | 0 refills | Status: DC

## 2018-08-19 NOTE — Progress Notes (Unsigned)
Sent in [employer]

## 2018-09-02 ENCOUNTER — Telehealth: Payer: Self-pay

## 2018-09-02 NOTE — Telephone Encounter (Signed)
LVM notifying patient of colonoscopy cancellation 09/09/18 due to COVID 19.  Thanks [employer]

## 2018-09-09 ENCOUNTER — Ambulatory Visit: Admit: 2018-09-09 | Payer: BLUE CROSS/BLUE SHIELD | Admitting: Gastroenterology

## 2018-09-09 SURGERY — COLONOSCOPY WITH PROPOFOL
Anesthesia: Choice

## 2018-09-12 ENCOUNTER — Encounter: Payer: Self-pay | Admitting: Family Medicine

## 2018-09-12 ENCOUNTER — Other Ambulatory Visit: Payer: Self-pay

## 2018-09-12 ENCOUNTER — Ambulatory Visit: Payer: BLUE CROSS/BLUE SHIELD | Admitting: Family Medicine

## 2018-09-12 VITALS — BP 120/64 | HR 84 | Ht 66.0 in | Wt 178.0 lb

## 2018-09-12 DIAGNOSIS — J0100 Acute maxillary sinusitis, unspecified: Secondary | ICD-10-CM | POA: Diagnosis not present

## 2018-09-12 DIAGNOSIS — E119 Type 2 diabetes mellitus without complications: Secondary | ICD-10-CM

## 2018-09-12 MED ORDER — METFORMIN HCL 500 MG PO TABS: 500.0000 mg | ORAL_TABLET | Freq: Two times a day (BID) | ORAL | 1 refills | Status: DC

## 2018-09-12 MED ORDER — AZITHROMYCIN 250 MG PO TABS: ORAL_TABLET | ORAL | 0 refills | Status: DC

## 2018-09-12 MED ORDER — EMPAGLIFLOZIN 10 MG PO TABS: 10.0000 mg | ORAL_TABLET | Freq: Every day | ORAL | 1 refills | Status: DC

## 2018-09-12 MED ORDER — INSULIN GLARGINE 100 UNIT/ML SOLOSTAR PEN: 20.0000 [IU] | PEN_INJECTOR | Freq: Every day | SUBCUTANEOUS | 4 refills | Status: DC

## 2018-09-12 NOTE — Progress Notes (Signed)
Date:  09/12/2018   Name:  Kevin Montgomery   DOB:  09-03-1965   MRN:  [id number]   Chief Complaint: Sinusitis (finished ZPack on 08/29/2018) and Diabetes (follow up- last reading 180/ taking 20 units of lantus solostar, jardiance and metformin)  Sinusitis  This is a chronic problem. The current episode started in the past 7 days. The problem is unchanged. There has been no fever. The pain is moderate. Pertinent negatives include no chills, congestion, coughing, diaphoresis, ear pain, headaches, hoarse voice, neck pain, shortness of breath, sinus pressure, sneezing, sore throat or swollen glands. Past treatments include nothing. The treatment provided moderate relief.  Diabetes  He presents for his follow-up diabetic visit. He has type 2 diabetes mellitus. His disease course has been stable. There are no hypoglycemic associated symptoms. Pertinent negatives for hypoglycemia include no confusion, dizziness, headaches, hunger, nervousness/anxiousness, pallor or seizures. There are no diabetic associated symptoms. Pertinent negatives for diabetes include no blurred vision, no chest pain, no fatigue, no foot paresthesias, no foot ulcerations, no polydipsia, no polyphagia, no polyuria, no visual change, no weakness and no weight loss. There are no hypoglycemic complications. Symptoms are stable. There are no diabetic complications. Risk factors for coronary artery disease include dyslipidemia and diabetes mellitus. He is following a generally healthy diet. Meal planning includes avoidance of concentrated sweets and carbohydrate counting. He participates in exercise intermittently. His home blood glucose trend is increasing rapidly. His breakfast blood glucose is taken between 8-9 am. An ACE inhibitor/angiotensin II receptor blocker is being taken.    Review of Systems  Constitutional: Negative for chills, diaphoresis, fatigue, fever and weight loss.  HENT: Negative for congestion, drooling, ear discharge,  ear pain, hoarse voice, sinus pressure, sneezing and sore throat.   Eyes: Negative for blurred vision.  Respiratory: Negative for cough, shortness of breath and wheezing.   Cardiovascular: Negative for chest pain, palpitations and leg swelling.  Gastrointestinal: Negative for abdominal pain, blood in stool, constipation, diarrhea and nausea.  Endocrine: Negative for polydipsia, polyphagia and polyuria.  Genitourinary: Negative for dysuria, frequency, hematuria and urgency.  Musculoskeletal: Negative for back pain, myalgias and neck pain.  Skin: Negative for pallor and rash.  Allergic/Immunologic: Negative for environmental allergies.  Neurological: Negative for dizziness, seizures, weakness and headaches.  Hematological: Does not bruise/bleed easily.  Psychiatric/Behavioral: Negative for confusion and suicidal ideas. The patient is not nervous/anxious.     Patient Active Problem List   Diagnosis Date Noted  . Gastroesophageal reflux disease 12/05/2017  . Incarcerated umbilical hernia   . Umbilical hernia 01/00/7121  . Diastasis recti 10/08/2015  . Essential hypertension 04/21/2015  . Type 2 diabetes mellitus without complication, without long-term current use of insulin ([place]) 04/21/2015  . Hyperlipidemia 04/21/2015    No Known Allergies  Past Surgical History:  Procedure Laterality Date  . KIDNEY STONE SURGERY    . LITHOTRIPSY    . UMBILICAL HERNIA REPAIR N/A 12/20/2015   Procedure: HERNIA REPAIR UMBILICAL ADULT;  Surgeon: Hubbard Robinson, MD;  Location: [facility] ORS;  Service: General;  Laterality: N/A;    Social History   Tobacco Use  . Smoking status: Never Smoker  . Smokeless tobacco: Never Used  Substance Use Topics  . Alcohol use: Yes    Alcohol/week: 0.0 standard drinks    Comment: rare  . Drug use: No     Medication list has been reviewed and updated.  Current Meds  Medication Sig  . aspirin 81 MG tablet Take 1 tablet (  81 mg total) by mouth daily.  .  Cinnamon 500 MG capsule Take 1,000 mg by mouth daily.  . empagliflozin (JARDIANCE) 10 MG TABS tablet Take 10 mg by mouth daily.  . Insulin Glargine (LANTUS) 100 UNIT/ML Solostar Pen Inject 20 Units into the skin daily.  Marland Kitchen lisinopril (PRINIVIL,ZESTRIL) 5 MG tablet TAKE ONE TABLET BY MOUTH EVERY MORNING  . metFORMIN (GLUCOPHAGE) 500 MG tablet Take 1 tablet (500 mg total) by mouth 2 (two) times daily with a meal.  . tamsulosin (FLOMAX) 0.4 MG CAPS capsule Take 1 capsule (0.4 mg total) by mouth daily.  . [DISCONTINUED] Insulin Syringe-Needle U-100 31G X 15/64" 0.5 ML MISC 16 Units/day by Does not apply route Nightly. (Patient taking differently: 20 Units/day by Does not apply route Nightly. )    PHQ 2/9 Scores 07/18/2018 01/30/2017 10/26/2015 07/22/2015  PHQ - 2 Score 0 0 0 0  PHQ- 9 Score 0 0 - -    BP Readings from Last 3 Encounters:  09/12/18 120/64  08/16/18 120/80  08/12/18 117/74    Physical Exam Vitals signs and nursing note reviewed.  HENT:     Head: Normocephalic.     Right Ear: Tympanic membrane, ear canal and external ear normal.     Left Ear: External ear normal.     Nose: Nose normal.  Eyes:     General: No scleral icterus.       Right eye: No discharge.        Left eye: No discharge.     Conjunctiva/sclera: Conjunctivae normal.     Pupils: Pupils are equal, round, and reactive to light.  Neck:     Musculoskeletal: Normal range of motion and neck supple.     Thyroid: No thyromegaly.     Vascular: No JVD.     Trachea: No tracheal deviation.  Cardiovascular:     Rate and Rhythm: Normal rate and regular rhythm.     Heart sounds: Normal heart sounds. No murmur. No friction rub. No gallop.   Pulmonary:     Effort: No respiratory distress.     Breath sounds: Normal breath sounds. No wheezing or rales.  Abdominal:     General: Bowel sounds are normal.     Palpations: Abdomen is soft. There is no mass.     Tenderness: There is no abdominal tenderness. There is no guarding or  rebound.  Musculoskeletal: Normal range of motion.        General: No tenderness.  Lymphadenopathy:     Cervical: No cervical adenopathy.  Skin:    General: Skin is warm.     Findings: No rash.  Neurological:     Mental Status: He is alert and oriented to person, place, and time.     Cranial Nerves: No cranial nerve deficit.     Deep Tendon Reflexes: Reflexes are normal and symmetric.     Wt Readings from Last 3 Encounters:  09/12/18 178 lb (80.7 kg)  08/16/18 180 lb (81.6 kg)  08/12/18 180 lb (81.6 kg)    BP 120/64   Pulse 84   Ht 5\' 6"  (1.676 m)   Wt 178 lb (80.7 kg)   BMI 28.73 kg/m   Assessment and Plan: 1. Type 2 diabetes mellitus without complication, without long-term current use of insulin (HCC) Chronic.  Uncontrolled.  States that he is in the 1 80-200 range at current dosing of 20 units of Lantus.  Patient is tolerating medications well therefore we will continue metformin at 500  mg twice a day and Jardiance 10 mg once a day but will increase his Lantus to 24 units over a 4-day.  Maintain for 2 weeks and then if not under 150 will increase another 4 units for total of 28 units and will recheck in 8 weeks. - metFORMIN (GLUCOPHAGE) 500 MG tablet; Take 1 tablet (500 mg total) by mouth 2 (two) times daily with a meal.  Dispense: 180 tablet; Refill: 1 - empagliflozin (JARDIANCE) 10 MG TABS tablet; Take 10 mg by mouth daily.  Dispense: 30 tablet; Refill: 1  2. Acute maxillary sinusitis, recurrence not specified Acute.  Persistent.  Will initiate azithromycin 250 mg 2 today then 1 a day for 4 days. - azithromycin (ZITHROMAX) 250 MG tablet; 2 today then 1 a day for 4 days  Dispense: 6 tablet; Refill: 0

## 2018-09-17 ENCOUNTER — Telehealth: Payer: Self-pay

## 2018-09-17 ENCOUNTER — Other Ambulatory Visit: Payer: Self-pay

## 2018-09-17 DIAGNOSIS — Z1211 Encounter for screening for malignant neoplasm of colon: Secondary | ICD-10-CM

## 2018-09-17 NOTE — Telephone Encounter (Signed)
Patients colonoscopy has been rescheduled to July 6th [place] with Dr. Allen Norris.  New instructions being sent to reflect new date.  Thanks [employer]

## 2018-10-15 ENCOUNTER — Other Ambulatory Visit: Payer: Self-pay | Admitting: Family Medicine

## 2018-10-15 DIAGNOSIS — K219 Gastro-esophageal reflux disease without esophagitis: Secondary | ICD-10-CM

## 2018-11-05 ENCOUNTER — Other Ambulatory Visit: Payer: Self-pay | Admitting: Family Medicine

## 2018-11-05 DIAGNOSIS — E119 Type 2 diabetes mellitus without complications: Secondary | ICD-10-CM

## 2018-11-08 ENCOUNTER — Other Ambulatory Visit: Payer: Self-pay

## 2018-11-08 ENCOUNTER — Ambulatory Visit: Payer: BLUE CROSS/BLUE SHIELD | Admitting: Family Medicine

## 2018-11-08 ENCOUNTER — Encounter: Payer: Self-pay | Admitting: Family Medicine

## 2018-11-08 VITALS — BP 102/74 | HR 82 | Resp 16 | Ht 66.0 in | Wt 188.0 lb

## 2018-11-08 DIAGNOSIS — E782 Mixed hyperlipidemia: Secondary | ICD-10-CM

## 2018-11-08 DIAGNOSIS — E119 Type 2 diabetes mellitus without complications: Secondary | ICD-10-CM

## 2018-11-08 DIAGNOSIS — I10 Essential (primary) hypertension: Secondary | ICD-10-CM | POA: Diagnosis not present

## 2018-11-08 DIAGNOSIS — Z794 Long term (current) use of insulin: Secondary | ICD-10-CM

## 2018-11-08 NOTE — Progress Notes (Signed)
Date:  11/08/2018   Name:  Kevin Montgomery   DOB:  1965/10/15   MRN:  [id number]   Chief Complaint: Diabetes (120-160 range BS )  Diabetes  He presents for his follow-up diabetic visit. He has type 2 diabetes mellitus. His disease course has been stable. There are no hypoglycemic associated symptoms. Pertinent negatives for hypoglycemia include no dizziness, headaches or nervousness/anxiousness. Associated symptoms include polyphagia. Pertinent negatives for diabetes include no blurred vision, no chest pain, no fatigue, no foot paresthesias, no foot ulcerations, no polydipsia, no polyuria, no visual change, no weakness and no weight loss. There are no hypoglycemic complications. There are no diabetic complications. There are no known risk factors for coronary artery disease.    Review of Systems  Constitutional: Negative for chills, fatigue, fever and weight loss.  HENT: Negative for drooling, ear discharge, ear pain and sore throat.   Eyes: Negative for blurred vision.  Respiratory: Negative for cough, shortness of breath and wheezing.   Cardiovascular: Negative for chest pain, palpitations and leg swelling.  Gastrointestinal: Negative for abdominal pain, blood in stool, constipation, diarrhea and nausea.  Endocrine: Positive for polyphagia. Negative for polydipsia and polyuria.  Genitourinary: Negative for dysuria, frequency, hematuria and urgency.  Musculoskeletal: Negative for back pain, myalgias and neck pain.  Skin: Negative for rash.  Allergic/Immunologic: Negative for environmental allergies.  Neurological: Negative for dizziness, weakness and headaches.  Hematological: Does not bruise/bleed easily.  Psychiatric/Behavioral: Negative for suicidal ideas. The patient is not nervous/anxious.     Patient Active Problem List   Diagnosis Date Noted  . Gastroesophageal reflux disease 12/05/2017  . Incarcerated umbilical hernia   . Umbilical hernia 25/42/7062  . Diastasis recti  10/08/2015  . Essential hypertension 04/21/2015  . Type 2 diabetes mellitus without complication, without long-term current use of insulin ([place]) 04/21/2015  . Hyperlipidemia 04/21/2015    No Known Allergies  Past Surgical History:  Procedure Laterality Date  . KIDNEY STONE SURGERY    . LITHOTRIPSY    . UMBILICAL HERNIA REPAIR N/A 12/20/2015   Procedure: HERNIA REPAIR UMBILICAL ADULT;  Surgeon: Hubbard Robinson, MD;  Location: [facility] ORS;  Service: General;  Laterality: N/A;    Social History   Tobacco Use  . Smoking status: Never Smoker  . Smokeless tobacco: Never Used  Substance Use Topics  . Alcohol use: Yes    Alcohol/week: 0.0 standard drinks    Comment: rare  . Drug use: No     Medication list has been reviewed and updated.  Current Meds  Medication Sig  . aspirin 81 MG tablet Take 1 tablet (81 mg total) by mouth daily.  . Cinnamon 500 MG capsule Take 1,000 mg by mouth daily.  . empagliflozin (JARDIANCE) 10 MG TABS tablet Take 10 mg by mouth daily.  . Insulin Glargine (LANTUS) 100 UNIT/ML Solostar Pen Inject 20 Units into the skin daily. (Patient taking differently: Inject 28 Units into the skin daily. )  . lisinopril (PRINIVIL,ZESTRIL) 5 MG tablet TAKE ONE TABLET BY MOUTH EVERY MORNING  . metFORMIN (GLUCOPHAGE) 500 MG tablet TAKE ONE TABLET BY MOUTH TWICE DAILY.  . pantoprazole (PROTONIX) 40 MG tablet TAKE ONE (1) TABLET BY MOUTH ONCE DAILY  . tamsulosin (FLOMAX) 0.4 MG CAPS capsule Take 1 capsule (0.4 mg total) by mouth daily.    PHQ 2/9 Scores 11/08/2018 07/18/2018 01/30/2017 10/26/2015  PHQ - 2 Score 0 0 0 0  PHQ- 9 Score 0 0 0 -    BP Readings  from Last 3 Encounters:  11/08/18 102/74  09/12/18 120/64  08/16/18 120/80    Physical Exam Vitals signs and nursing note reviewed.  HENT:     Head: Normocephalic.     Right Ear: External ear normal.     Left Ear: External ear normal.     Nose: Nose normal.  Eyes:     General: No scleral icterus.       Right  eye: No discharge.        Left eye: No discharge.     Conjunctiva/sclera: Conjunctivae normal.     Pupils: Pupils are equal, round, and reactive to light.  Neck:     Musculoskeletal: Normal range of motion and neck supple.     Thyroid: No thyromegaly.     Vascular: No JVD.     Trachea: No tracheal deviation.  Cardiovascular:     Rate and Rhythm: Normal rate and regular rhythm.     Heart sounds: Normal heart sounds. No murmur. No friction rub. No gallop.   Pulmonary:     Effort: No respiratory distress.     Breath sounds: Normal breath sounds. No wheezing or rales.  Abdominal:     General: Bowel sounds are normal.     Palpations: Abdomen is soft. There is no mass.     Tenderness: There is no abdominal tenderness. There is no guarding or rebound.  Musculoskeletal: Normal range of motion.        General: No tenderness.  Lymphadenopathy:     Cervical: No cervical adenopathy.  Skin:    General: Skin is warm.     Findings: No rash.  Neurological:     Mental Status: He is alert and oriented to person, place, and time.     Cranial Nerves: No cranial nerve deficit.     Deep Tendon Reflexes: Reflexes are normal and symmetric.     Wt Readings from Last 3 Encounters:  11/08/18 188 lb (85.3 kg)  09/12/18 178 lb (80.7 kg)  08/16/18 180 lb (81.6 kg)    BP 102/74   Pulse 82   Resp 16   Ht 5\' 6"  (1.676 m)   Wt 188 lb (85.3 kg)   SpO2 99%   BMI 30.34 kg/m   Assessment and Plan:   1. Type 2 diabetes mellitus without complication, with long-term current use of insulin ([place]) Relatively new onset.  Controlled.  Fasting blood sugars have been in the 1 30-1 40 range.  Will check A1c and renal function panel to determine whether we continue Lantus Januvia. - Hemoglobin A1c - Renal Function Panel  2. Essential hypertension Chronic.  Controlled.  Check renal function panel to evaluate GFR. - Renal Function Panel  3. Moderate mixed hyperlipidemia not requiring statin therapy Chronic.   Uncontrolled.  Will check triglycerides with a lipid panel to see if any antilipid therapy needs to be initiated. - Lipid panel

## 2018-11-09 LAB — LIPID PANEL
Chol/HDL Ratio: 3.8 ratio (ref 0.0–5.0)
Cholesterol, Total: 163 mg/dL (ref 100–199)
HDL: 43 mg/dL (ref >39)
LDL Calculated: 97 mg/dL (ref 0–99)
Triglycerides: 115 mg/dL (ref 0–149)
VLDL Cholesterol Cal: 23 mg/dL (ref 5–40)

## 2018-11-09 LAB — HEMOGLOBIN A1C
Est. average glucose Bld gHb Est-mCnc: 169 mg/dL
Hgb A1c MFr Bld: 7.5 % — ABNORMAL HIGH (ref 4.8–5.6)

## 2018-11-09 LAB — RENAL FUNCTION PANEL
Albumin: 5 g/dL — ABNORMAL HIGH (ref 3.8–4.9)
BUN/Creatinine Ratio: 15 (ref 9–20)
BUN: 14 mg/dL (ref 6–24)
CO2: 20 mmol/L (ref 20–29)
Calcium: 10.1 mg/dL (ref 8.7–10.2)
Chloride: 97 mmol/L (ref 96–106)
Creatinine, Ser: 0.96 mg/dL (ref 0.76–1.27)
GFR calc Af Amer: 105 mL/min/{1.73_m2} (ref >59)
GFR calc non Af Amer: 91 mL/min/{1.73_m2} (ref >59)
Glucose: 166 mg/dL — ABNORMAL HIGH (ref 65–99)
Phosphorus: 3.4 mg/dL (ref 2.8–4.1)
Potassium: 4.1 mmol/L (ref 3.5–5.2)
Sodium: 136 mmol/L (ref 134–144)

## 2018-11-18 ENCOUNTER — Other Ambulatory Visit: Payer: Self-pay | Admitting: Family Medicine

## 2018-11-18 DIAGNOSIS — N401 Enlarged prostate with lower urinary tract symptoms: Secondary | ICD-10-CM

## 2018-11-18 DIAGNOSIS — E119 Type 2 diabetes mellitus without complications: Secondary | ICD-10-CM

## 2018-12-09 ENCOUNTER — Other Ambulatory Visit: Payer: Self-pay

## 2018-12-09 ENCOUNTER — Encounter: Payer: Self-pay | Admitting: *Deleted

## 2018-12-09 DIAGNOSIS — Z01812 Encounter for preprocedural laboratory examination: Secondary | ICD-10-CM

## 2018-12-09 MED ORDER — NA SULFATE-K SULFATE-MG SULF 17.5-3.13-1.6 GM/177ML PO SOLN: 1.0000 | Freq: Once | ORAL | 0 refills | Status: AC

## 2018-12-10 ENCOUNTER — Telehealth: Payer: Self-pay | Admitting: Gastroenterology

## 2018-12-12 ENCOUNTER — Other Ambulatory Visit: Payer: Self-pay

## 2018-12-12 ENCOUNTER — Other Ambulatory Visit
Admission: RE | Admit: 2018-12-12 | Discharge: 2018-12-12 | Disposition: A | Discharge status: Home / Self Care | Payer: BLUE CROSS/BLUE SHIELD | Source: Ambulatory Visit | Attending: Gastroenterology | Admitting: Gastroenterology

## 2018-12-12 DIAGNOSIS — Z01812 Encounter for preprocedural laboratory examination: Secondary | ICD-10-CM | POA: Diagnosis not present

## 2018-12-12 DIAGNOSIS — Z1159 Encounter for screening for other viral diseases: Secondary | ICD-10-CM | POA: Insufficient documentation

## 2018-12-12 LAB — SARS CORONAVIRUS 2 (TAT 6-24 HRS): SARS Coronavirus 2: NEGATIVE

## 2018-12-12 NOTE — Discharge Instructions (Signed)

## 2018-12-16 ENCOUNTER — Ambulatory Visit
Admission: RE | Admit: 2018-12-16 | Discharge: 2018-12-16 | Disposition: A | Discharge status: Home / Self Care | Payer: BLUE CROSS/BLUE SHIELD | Attending: Gastroenterology | Admitting: Gastroenterology

## 2018-12-16 ENCOUNTER — Ambulatory Visit: Payer: BLUE CROSS/BLUE SHIELD | Admitting: Anesthesiology

## 2018-12-16 ENCOUNTER — Encounter
Admission: RE | Disposition: A | Discharge status: Home / Self Care | Payer: Self-pay | Source: Home / Self Care | Attending: Gastroenterology

## 2018-12-16 DIAGNOSIS — Z79899 Other long term (current) drug therapy: Secondary | ICD-10-CM | POA: Insufficient documentation

## 2018-12-16 DIAGNOSIS — E1022 Type 1 diabetes mellitus with diabetic chronic kidney disease: Secondary | ICD-10-CM | POA: Insufficient documentation

## 2018-12-16 DIAGNOSIS — K219 Gastro-esophageal reflux disease without esophagitis: Secondary | ICD-10-CM | POA: Insufficient documentation

## 2018-12-16 DIAGNOSIS — I129 Hypertensive chronic kidney disease with stage 1 through stage 4 chronic kidney disease, or unspecified chronic kidney disease: Secondary | ICD-10-CM | POA: Insufficient documentation

## 2018-12-16 DIAGNOSIS — K640 First degree hemorrhoids: Secondary | ICD-10-CM | POA: Insufficient documentation

## 2018-12-16 DIAGNOSIS — N189 Chronic kidney disease, unspecified: Secondary | ICD-10-CM | POA: Diagnosis not present

## 2018-12-16 DIAGNOSIS — M1990 Unspecified osteoarthritis, unspecified site: Secondary | ICD-10-CM | POA: Insufficient documentation

## 2018-12-16 DIAGNOSIS — Z7982 Long term (current) use of aspirin: Secondary | ICD-10-CM | POA: Insufficient documentation

## 2018-12-16 DIAGNOSIS — Z794 Long term (current) use of insulin: Secondary | ICD-10-CM | POA: Insufficient documentation

## 2018-12-16 DIAGNOSIS — Z1211 Encounter for screening for malignant neoplasm of colon: Secondary | ICD-10-CM

## 2018-12-16 HISTORY — PX: COLONOSCOPY WITH PROPOFOL: SHX5780

## 2018-12-16 LAB — GLUCOSE, CAPILLARY: Glucose-Capillary: 156 mg/dL — ABNORMAL HIGH (ref 70–99)

## 2018-12-16 SURGERY — COLONOSCOPY WITH PROPOFOL
Anesthesia: General | Site: Rectum

## 2018-12-16 MED ORDER — ACETAMINOPHEN 325 MG PO TABS: 325.0000 mg | ORAL_TABLET | Freq: Once | ORAL | Status: DC

## 2018-12-16 MED ORDER — LACTATED RINGERS IV SOLN
10.0000 mL/h | INTRAVENOUS | Status: DC
  Administered 2018-12-16: 10 mL/h via INTRAVENOUS

## 2018-12-16 MED ORDER — ACETAMINOPHEN 160 MG/5ML PO SOLN: 325.0000 mg | Freq: Once | ORAL | Status: DC

## 2018-12-16 MED ORDER — LIDOCAINE HCL (CARDIAC) PF 100 MG/5ML IV SOSY
PREFILLED_SYRINGE | INTRAVENOUS | Status: DC | PRN
  Administered 2018-12-16: 50 mg via INTRAVENOUS

## 2018-12-16 MED ORDER — SODIUM CHLORIDE 0.9 % IV SOLN: INTRAVENOUS | Status: DC

## 2018-12-16 MED ORDER — PROPOFOL 10 MG/ML IV BOLUS
INTRAVENOUS | Status: DC | PRN
  Administered 2018-12-16 (×4): 50 mg via INTRAVENOUS

## 2018-12-16 MED ORDER — STERILE WATER FOR IRRIGATION IR SOLN
Status: DC | PRN
  Administered 2018-12-16: 15 mL

## 2018-12-16 SURGICAL SUPPLY — 5 items
CANISTER SUCT 1200ML W/VALVE (MISCELLANEOUS) ×2 IMPLANT
GOWN CVR UNV OPN BCK APRN NK (MISCELLANEOUS) ×2 IMPLANT
GOWN ISOL THUMB LOOP REG UNIV (MISCELLANEOUS) ×2
KIT ENDO PROCEDURE OLY (KITS) ×2 IMPLANT
WATER STERILE IRR 250ML POUR (IV SOLUTION) ×2 IMPLANT

## 2018-12-16 NOTE — H&P (Signed)
Kevin Lame, MD [address] [place], [place] [address] Phone: [phone] Fax : [phone]  Primary Care Physician:  Kevin Patch, MD Primary Gastroenterologist:  Dr. Allen Montgomery  Pre-Procedure History & Physical: HPI:  Kevin Montgomery is a 53 y.o. male is here for a screening colonoscopy.   Past Medical History:  Diagnosis Date  . Arthritis   . Chronic kidney disease    h/o kidney stones  . Complication of anesthesia   . Diabetes mellitus without complication ([place])   . GERD (gastroesophageal reflux disease)    no meds  . Hypertension    pt denies and states that Lisinopril is for kidney protection only from diabetes  . PONV (postoperative nausea and vomiting)     Past Surgical History:  Procedure Laterality Date  . KIDNEY STONE SURGERY    . LITHOTRIPSY    . UMBILICAL HERNIA REPAIR N/A 12/20/2015   Procedure: HERNIA REPAIR UMBILICAL ADULT;  Surgeon: Kevin Robinson, MD;  Location: [facility] ORS;  Service: General;  Laterality: N/A;    Prior to Admission medications   Medication Sig Start Date End Date Taking? Authorizing Provider  aspirin 81 MG tablet Take 1 tablet (81 mg total) by mouth daily. 04/21/15  Yes Kevin Patch, MD  Cinnamon 500 MG capsule Take 1,000 mg by mouth daily.   Yes [provider]  Insulin Glargine (LANTUS) 100 UNIT/ML Solostar Pen Inject 20 Units into the skin daily. Patient taking differently: Inject 28 Units into the skin daily.  09/12/18  Yes Kevin Patch, MD  JARDIANCE 10 MG TABS tablet TAKE ONE (1) TABLET BY MOUTH ONCE DAILY 11/19/18  Yes Kevin Patch, MD  lisinopril (PRINIVIL,ZESTRIL) 5 MG tablet TAKE ONE TABLET BY MOUTH EVERY MORNING 07/18/18  Yes Kevin Patch, MD  metFORMIN (GLUCOPHAGE) 500 MG tablet TAKE ONE TABLET BY MOUTH TWICE DAILY. 11/05/18  Yes Kevin Patch, MD  pantoprazole (PROTONIX) 40 MG tablet TAKE ONE (1) TABLET BY MOUTH ONCE DAILY 10/15/18  Yes Kevin Patch, MD  tamsulosin (FLOMAX) 0.4 MG CAPS  capsule TAKE (1) CAPSULE BY MOUTH EVERY DAY 11/19/18  Yes Kevin Patch, MD    Allergies as of 09/17/2018  . (No Known Allergies)    Family History  Problem Relation Age of Onset  . Hyperlipidemia Mother   . Cancer Father        Pancreatic  . Diabetes Father   . Cancer Paternal Uncle   . Cancer Paternal Grandmother     Social History   Socioeconomic History  . Marital status: Married    Spouse name: Not on file  . Number of children: Not on file  . Years of education: Not on file  . Highest education level: Not on file  Occupational History  . Not on file  Social Needs  . Financial resource strain: Not on file  . Food insecurity    Worry: Not on file    Inability: Not on file  . Transportation needs    Medical: Not on file    Non-medical: Not on file  Tobacco Use  . Smoking status: Never Smoker  . Smokeless tobacco: Never Used  Substance and Sexual Activity  . Alcohol use: Yes    Alcohol/week: 0.0 standard drinks    Comment: rare  . Drug use: No  . Sexual activity: Yes  Lifestyle  . Physical activity    Days per week: Not on file    Minutes per session: Not  on file  . Stress: Not on file  Relationships  . Social Herbalist on phone: Not on file    Gets together: Not on file    Attends religious service: Not on file    Active member of club or organization: Not on file    Attends meetings of clubs or organizations: Not on file    Relationship status: Not on file  . Intimate partner violence    Fear of current or ex partner: Not on file    Emotionally abused: Not on file    Physically abused: Not on file    Forced sexual activity: Not on file  Other Topics Concern  . Not on file  Social History Narrative  . Not on file    Review of Systems: See HPI, otherwise negative ROS  Physical Exam: BP (!) 128/95   Pulse 70   Temp (!) 97.3 F (36.3 C) (Temporal)   Resp 16   Ht 5\' 6"  (1.676 m)   Wt 78.9 kg   SpO2 98%   BMI 28.08 kg/m   General:   Alert,  pleasant and cooperative in NAD Head:  Normocephalic and atraumatic. Neck:  Supple; no masses or thyromegaly. Lungs:  Clear throughout to auscultation.    Heart:  Regular rate and rhythm. Abdomen:  Soft, nontender and nondistended. Normal bowel sounds, without guarding, and without rebound.   Neurologic:  Alert and  oriented x4;  grossly normal neurologically.  Impression/Plan: Kevin Montgomery is now here to undergo a screening colonoscopy.  Risks, benefits, and alternatives regarding colonoscopy have been reviewed with the patient.  Questions have been answered.  All parties agreeable.

## 2018-12-16 NOTE — Op Note (Signed)
[facility] Patient Name: Kevin Montgomery Procedure Date: 12/16/2018 7:24 AM MRN: [id number] Account #: [id number] Date of Birth: [birth year] Admit Type: Outpatient Age: 53 Room: [facility] OR ROOM 01 Gender: Male Note Status: Finalized Procedure:            Colonoscopy Indications:          Screening for colorectal malignant neoplasm Providers:            Lucilla Lame MD, MD Referring MD:         Juline Patch, MD (Referring MD) Medicines:            Propofol per Anesthesia Complications:        No immediate complications. Procedure:            Pre-Anesthesia Assessment:                       - Prior to the procedure, a History and Physical was                        performed, and patient medications and allergies were                        reviewed. The patient's tolerance of previous                        anesthesia was also reviewed. The risks and benefits of                        the procedure and the sedation options and risks were                        discussed with the patient. All questions were                        answered, and informed consent was obtained. Prior                        Anticoagulants: The patient has taken no previous                        anticoagulant or antiplatelet agents. ASA Grade                        Assessment: II - A patient with mild systemic disease.                        After reviewing the risks and benefits, the patient was                        deemed in satisfactory condition to undergo the                        procedure.                       After obtaining informed consent, the colonoscope was                        passed under direct vision. Throughout the procedure,  the patient's blood pressure, pulse, and oxygen                        saturations were monitored continuously. The was                        introduced through the anus and advanced to the the               cecum, identified by appendiceal orifice and ileocecal                        valve. The colonoscopy was performed without                        difficulty. The patient tolerated the procedure well.                        The quality of the bowel preparation was excellent. Findings:      The perianal and digital rectal examinations were normal.      Non-bleeding internal hemorrhoids were found during retroflexion. The       hemorrhoids were Grade I (internal hemorrhoids that do not prolapse). Impression:           - Non-bleeding internal hemorrhoids.                       - No specimens collected. Recommendation:       - Discharge patient to home.                       - Resume previous diet.                       - Continue present medications.                       - Repeat colonoscopy in 10 years for screening unless                        any change in family history or lower GI problems. Procedure Code(s):    --- Professional ---                       [phone], Colonoscopy, flexible; diagnostic, including                        collection of specimen(s) by brushing or washing, when                        performed (separate procedure) Diagnosis Code(s):    --- Professional ---                       Z12.11, Encounter for screening for malignant neoplasm                        of colon CPT copyright 2019 American [employer]. All rights reserved. The codes documented in this report are preliminary and upon coder review may  be revised to meet current compliance requirements. Lucilla Lame MD, MD 12/16/2018 7:59:30 AM This report has been signed electronically. Number of Addenda: 0 Note Initiated On: 12/16/2018 7:24 AM Scope Withdrawal Time:  0 hours 6 minutes 11 seconds  Total Procedure Duration: 0 hours 7 minutes 56 seconds  Estimated Blood Loss: Estimated blood loss: none.      [facility]

## 2018-12-16 NOTE — Transfer of Care (Signed)
Immediate Anesthesia Transfer of Care Note  Patient: Kevin Montgomery  Procedure(s) Performed: COLONOSCOPY WITH PROPOFOL (N/A Rectum)  Patient Location: PACU  Anesthesia Type: General  Level of Consciousness: awake, alert  and patient cooperative  Airway and Oxygen Therapy: Patient Spontanous Breathing and Patient connected to supplemental oxygen  Post-op Assessment: Post-op Vital signs reviewed, Patient's Cardiovascular Status Stable, Respiratory Function Stable, Patent Airway and No signs of Nausea or vomiting  Post-op Vital Signs: Reviewed and stable  Complications: No apparent anesthesia complications

## 2018-12-16 NOTE — Anesthesia Preprocedure Evaluation (Signed)
Anesthesia Evaluation  Patient identified by MRN, date of birth, ID band Patient awake    Reviewed: Allergy & Precautions, H&P , NPO status , Patient's Chart, lab work & pertinent test results  History of Anesthesia Complications (+) PONV and history of anesthetic complications  Airway Mallampati: I  TM Distance: >3 FB Neck ROM: full    Dental no notable dental hx.    Pulmonary    Pulmonary exam normal breath sounds clear to auscultation       Cardiovascular hypertension, Normal cardiovascular exam Rhythm:regular Rate:Normal     Neuro/Psych    GI/Hepatic GERD  ,  Endo/Other  diabetes, Type 1, Insulin Dependent  Renal/GU      Musculoskeletal   Abdominal   Peds  Hematology   Anesthesia Other Findings   Reproductive/Obstetrics                             Anesthesia Physical Anesthesia Plan  ASA: II  Anesthesia Plan: General   Post-op Pain Management:    Induction: Intravenous  PONV Risk Score and Plan: 3 and Propofol infusion and Treatment may vary due to age or medical condition  Airway Management Planned: Natural Airway  Additional Equipment:   Intra-op Plan:   Post-operative Plan:   Informed Consent: I have reviewed the patients History and Physical, chart, labs and discussed the procedure including the risks, benefits and alternatives for the proposed anesthesia with the patient or authorized representative who has indicated his/her understanding and acceptance.       Plan Discussed with: CRNA  Anesthesia Plan Comments:         Anesthesia Quick Evaluation

## 2018-12-16 NOTE — Anesthesia Postprocedure Evaluation (Signed)
Anesthesia Post Note  Patient: Kevin Montgomery  Procedure(s) Performed: COLONOSCOPY WITH PROPOFOL (N/A Rectum)  Patient location during evaluation: PACU Anesthesia Type: General Level of consciousness: awake and alert and oriented Pain management: satisfactory to patient Vital Signs Assessment: post-procedure vital signs reviewed and stable Respiratory status: spontaneous breathing, nonlabored ventilation and respiratory function stable Cardiovascular status: blood pressure returned to baseline and stable Postop Assessment: Adequate PO intake and No signs of nausea or vomiting Anesthetic complications: no    Raliegh Ip

## 2018-12-17 ENCOUNTER — Encounter: Payer: Self-pay | Admitting: Gastroenterology

## 2018-12-17 ENCOUNTER — Other Ambulatory Visit: Payer: Self-pay | Admitting: Family Medicine

## 2018-12-17 DIAGNOSIS — E119 Type 2 diabetes mellitus without complications: Secondary | ICD-10-CM

## 2019-01-01 NOTE — Telephone Encounter (Signed)
error 

## 2019-01-20 ENCOUNTER — Other Ambulatory Visit: Payer: Self-pay | Admitting: Family Medicine

## 2019-01-20 DIAGNOSIS — K219 Gastro-esophageal reflux disease without esophagitis: Secondary | ICD-10-CM

## 2019-01-24 ENCOUNTER — Encounter: Payer: Self-pay | Admitting: Emergency Medicine

## 2019-01-24 ENCOUNTER — Other Ambulatory Visit: Payer: Self-pay

## 2019-01-24 ENCOUNTER — Ambulatory Visit (INDEPENDENT_AMBULATORY_CARE_PROVIDER_SITE_OTHER)
Admission: EM | Admit: 2019-01-24 | Discharge: 2019-01-24 | Disposition: A | Discharge status: Other Acute Inpatient Hospital | Payer: BLUE CROSS/BLUE SHIELD | Source: Home / Self Care | Attending: Family Medicine | Admitting: Family Medicine

## 2019-01-24 ENCOUNTER — Emergency Department: Payer: BLUE CROSS/BLUE SHIELD

## 2019-01-24 ENCOUNTER — Emergency Department
Admission: EM | Admit: 2019-01-24 | Discharge: 2019-01-24 | Disposition: A | Discharge status: Home / Self Care | Payer: BLUE CROSS/BLUE SHIELD | Attending: Emergency Medicine | Admitting: Emergency Medicine

## 2019-01-24 DIAGNOSIS — E1122 Type 2 diabetes mellitus with diabetic chronic kidney disease: Secondary | ICD-10-CM | POA: Diagnosis not present

## 2019-01-24 DIAGNOSIS — Z794 Long term (current) use of insulin: Secondary | ICD-10-CM

## 2019-01-24 DIAGNOSIS — Z7982 Long term (current) use of aspirin: Secondary | ICD-10-CM | POA: Diagnosis not present

## 2019-01-24 DIAGNOSIS — N189 Chronic kidney disease, unspecified: Secondary | ICD-10-CM | POA: Insufficient documentation

## 2019-01-24 DIAGNOSIS — Z79899 Other long term (current) drug therapy: Secondary | ICD-10-CM | POA: Insufficient documentation

## 2019-01-24 DIAGNOSIS — E785 Hyperlipidemia, unspecified: Secondary | ICD-10-CM | POA: Insufficient documentation

## 2019-01-24 DIAGNOSIS — E119 Type 2 diabetes mellitus without complications: Secondary | ICD-10-CM

## 2019-01-24 DIAGNOSIS — I129 Hypertensive chronic kidney disease with stage 1 through stage 4 chronic kidney disease, or unspecified chronic kidney disease: Secondary | ICD-10-CM | POA: Diagnosis not present

## 2019-01-24 DIAGNOSIS — R0789 Other chest pain: Secondary | ICD-10-CM | POA: Insufficient documentation

## 2019-01-24 DIAGNOSIS — R079 Chest pain, unspecified: Secondary | ICD-10-CM

## 2019-01-24 LAB — CBC
HCT: 45 % (ref 39.0–52.0)
Hemoglobin: 16.4 g/dL (ref 13.0–17.0)
MCH: 32.9 pg (ref 26.0–34.0)
MCHC: 36.4 g/dL — ABNORMAL HIGH (ref 30.0–36.0)
MCV: 90.2 fL (ref 80.0–100.0)
Platelets: 189 10*3/uL (ref 150–400)
RBC: 4.99 MIL/uL (ref 4.22–5.81)
RDW: 12 % (ref 11.5–15.5)
WBC: 6.8 10*3/uL (ref 4.0–10.5)
nRBC: 0 % (ref 0.0–0.2)

## 2019-01-24 LAB — BASIC METABOLIC PANEL
Anion gap: 12 (ref 5–15)
BUN: 13 mg/dL (ref 6–20)
CO2: 22 mmol/L (ref 22–32)
Calcium: 9.8 mg/dL (ref 8.9–10.3)
Chloride: 101 mmol/L (ref 98–111)
Creatinine, Ser: 0.81 mg/dL (ref 0.61–1.24)
GFR calc Af Amer: >60 mL/min (ref >60)
GFR calc non Af Amer: >60 mL/min (ref >60)
Glucose, Bld: 161 mg/dL — ABNORMAL HIGH (ref 70–99)
Potassium: 3.6 mmol/L (ref 3.5–5.1)
Sodium: 135 mmol/L (ref 135–145)

## 2019-01-24 LAB — HEPATIC FUNCTION PANEL
ALT: 50 U/L — ABNORMAL HIGH (ref 0–44)
AST: 29 U/L (ref 15–41)
Albumin: 4.5 g/dL (ref 3.5–5.0)
Alkaline Phosphatase: 49 U/L (ref 38–126)
Bilirubin, Direct: 0.1 mg/dL (ref 0.0–0.2)
Indirect Bilirubin: 1.6 mg/dL — ABNORMAL HIGH (ref 0.3–0.9)
Total Bilirubin: 1.7 mg/dL — ABNORMAL HIGH (ref 0.3–1.2)
Total Protein: 7.3 g/dL (ref 6.5–8.1)

## 2019-01-24 LAB — TROPONIN I (HIGH SENSITIVITY)
Troponin I (High Sensitivity): <2 ng/L (ref <18)
Troponin I (High Sensitivity): 3 ng/L (ref <18)

## 2019-01-24 LAB — LIPASE, BLOOD: Lipase: 19 U/L (ref 11–51)

## 2019-01-24 MED ORDER — ASPIRIN 81 MG PO CHEW
324.0000 mg | CHEWABLE_TABLET | Freq: Once | ORAL | Status: AC
  Administered 2019-01-24: 324 mg via ORAL

## 2019-01-24 MED ORDER — NITROGLYCERIN 2 % TD OINT
0.5000 [in_us] | TOPICAL_OINTMENT | Freq: Once | TRANSDERMAL | Status: AC
  Administered 2019-01-24: 0.5 [in_us] via TOPICAL

## 2019-01-24 NOTE — ED Provider Notes (Signed)
[facility] Emergency Department Provider Note   ____________________________________________   I have reviewed the triage vital signs and the nursing notes.   HISTORY  Chief Complaint Chest Pain   History limited by: Not Limited   HPI Kevin Montgomery is a 53 y.o. male who presents to the emergency department today because of concern for chest pain. It started at 10am this morning. States he was driving when it started. Located in the epigastric region describes it as being sharp and pressure like. Took his breath away although he denies any true shortness of breath. The patient denies similar pain in the past. Had some left shoulder pain yesterday. Went initially to urgent care and was given aspirin and nitroglycerin. Says that the pain had started to ease off prior to receiving those medications. Denies any recent illness or fever.    Records reviewed. Per medical record review patient has a history of diabetes, GERD, HTN, CKD.  Past Medical History:  Diagnosis Date  . Arthritis   . Chronic kidney disease    h/o kidney stones  . Complication of anesthesia   . Diabetes mellitus without complication ([place])   . GERD (gastroesophageal reflux disease)    no meds  . Hypertension    pt denies and states that Lisinopril is for kidney protection only from diabetes  . PONV (postoperative nausea and vomiting)     Patient Active Problem List   Diagnosis Date Noted  . Encounter for screening colonoscopy   . Gastroesophageal reflux disease 12/05/2017  . Incarcerated umbilical hernia   . Umbilical hernia 97/67/3419  . Diastasis recti 10/08/2015  . Essential hypertension 04/21/2015  . Hyperlipidemia 04/21/2015    Past Surgical History:  Procedure Laterality Date  . COLONOSCOPY WITH PROPOFOL N/A 12/16/2018   Procedure: COLONOSCOPY WITH PROPOFOL;  Surgeon: Lucilla Lame, MD;  Location: [facility];  Service: Endoscopy;  Laterality: N/A;  Diabetic -  insulin and oral meds  . KIDNEY STONE SURGERY    . LITHOTRIPSY    . UMBILICAL HERNIA REPAIR N/A 12/20/2015   Procedure: HERNIA REPAIR UMBILICAL ADULT;  Surgeon: Hubbard Robinson, MD;  Location: [facility] ORS;  Service: General;  Laterality: N/A;    Prior to Admission medications   Medication Sig Start Date End Date Taking? Authorizing Provider  aspirin 81 MG tablet Take 1 tablet (81 mg total) by mouth daily. 04/21/15   Juline Patch, MD  Cinnamon 500 MG capsule Take 1,000 mg by mouth daily.    [provider]  Insulin Glargine (LANTUS) 100 UNIT/ML Solostar Pen Inject 20 Units into the skin daily. Patient taking differently: Inject 28 Units into the skin daily.  09/12/18   Juline Patch, MD  JARDIANCE 10 MG TABS tablet TAKE ONE (1) TABLET BY MOUTH ONCE DAILY 12/17/18   Juline Patch, MD  lisinopril (PRINIVIL,ZESTRIL) 5 MG tablet TAKE ONE TABLET BY MOUTH EVERY MORNING 07/18/18   Juline Patch, MD  metFORMIN (GLUCOPHAGE) 500 MG tablet TAKE ONE TABLET BY MOUTH TWICE DAILY. 11/05/18   Juline Patch, MD  pantoprazole (PROTONIX) 40 MG tablet TAKE (1) TABLET BY MOUTH EVERY DAY 01/20/19   Juline Patch, MD  tamsulosin (FLOMAX) 0.4 MG CAPS capsule TAKE (1) CAPSULE BY MOUTH EVERY DAY 11/19/18   Juline Patch, MD    Allergies Patient has no known allergies.  Family History  Problem Relation Age of Onset  . Hyperlipidemia Mother   . Cancer Father  Pancreatic  . Diabetes Father   . Cancer Paternal Uncle   . Cancer Paternal Grandmother     Social History Social History   Tobacco Use  . Smoking status: Never Smoker  . Smokeless tobacco: Never Used  Substance Use Topics  . Alcohol use: Yes    Alcohol/week: 0.0 standard drinks    Comment: rare  . Drug use: No    Review of Systems Constitutional: No fever/chills Eyes: No visual changes. ENT: No sore throat. Cardiovascular: Positive for chest pain. Respiratory: Denies shortness of breath. Gastrointestinal: Positive for  epigastric pain.  No nausea, no vomiting.  No diarrhea.   Genitourinary: Negative for dysuria. Musculoskeletal: Negative for back pain. Skin: Negative for rash. Neurological: Negative for headaches, focal weakness or numbness.  ____________________________________________   PHYSICAL EXAM:  VITAL SIGNS: ED Triage Vitals  Enc Vitals Group     BP -- 122/79     Pulse -- 78     Resp -- 12     Temp -- 98.5     Temp src --      SpO2 -- 96     Weight 01/24/19 1310 174 lb (78.9 kg)     Height 01/24/19 1310 5\' 6"  (1.676 m)     Head Circumference --      Peak Flow --      Pain Score 01/24/19 1309 2   Constitutional: Alert and oriented.  Eyes: Conjunctivae are normal.  ENT      Head: Normocephalic and atraumatic.      Nose: No congestion/rhinnorhea.      Mouth/Throat: Mucous membranes are moist.      Neck: No stridor. Hematological/Lymphatic/Immunilogical: No cervical lymphadenopathy. Cardiovascular: Normal rate, regular rhythm.  No murmurs, rubs, or gallops.  Respiratory: Normal respiratory effort without tachypnea nor retractions. Breath sounds are clear and equal bilaterally. No wheezes/rales/rhonchi. Gastrointestinal: Soft and non tender. No rebound. No guarding.  Genitourinary: Deferred Musculoskeletal: Normal range of motion in all extremities. No lower extremity edema. Neurologic:  Normal speech and language. No gross focal neurologic deficits are appreciated.  Skin:  Skin is warm, dry and intact. No rash noted. Psychiatric: Mood and affect are normal. Speech and behavior are normal. Patient exhibits appropriate insight and judgment.  ____________________________________________    LABS (pertinent positives/negatives)  CBC wbc 6.8, hgb 16.4, plt 189 Trop hs <2 -> 3 BMP wnl except glu 161 ____________________________________________   EKG  I, Nance Pear, attending physician, personally viewed and interpreted this EKG  EKG Time: 1259 Rate: 83 Rhythm: normal  sinus rhythm Axis: normal Intervals: qtc 439 QRS: narrow ST changes: no st elevation Impression: normal ekg  ____________________________________________    RADIOLOGY  CXR No active cardiopulmonary disease  ____________________________________________   PROCEDURES  Procedures  ____________________________________________   INITIAL IMPRESSION / ASSESSMENT AND PLAN / ED COURSE  Pertinent labs & imaging results that were available during my care of the patient were reviewed by me and considered in my medical decision making (see chart for details).   Patient presented to the emergency department today because of concerns for epigastric pain.  By the time my exam the pain had improved.  EKG without concerning findings.  2 sets of troponin negative.  Likewise lipase hepatic function tests without concerning findings.  Not completely clear etiology of the patient's pain.  Do wonder if GERD was the cause.  Will discharge home.  Discussed with patient portance of follow-up.   ____________________________________________   FINAL CLINICAL IMPRESSION(S) / ED DIAGNOSES  Final  diagnoses:  Nonspecific chest pain     Note: This dictation was prepared with Dragon dictation. Any transcriptional errors that result from this process are unintentional     Nance Pear, MD 01/24/19 1605

## 2019-01-24 NOTE — Discharge Instructions (Addendum)
Please seek medical attention for any high fevers, chest pain, shortness of breath, change in behavior, persistent vomiting, bloody stool or any other new or concerning symptoms.  

## 2019-01-24 NOTE — ED Triage Notes (Signed)
Pt started with acute onset epigastric pain while driving today.  Did have some left shoulder pain last night.  Was given 324 mg ASA while at urgent care and 1 NTG at urgent care today.  Pain 2/10 at this time.  No other associated sx.

## 2019-01-24 NOTE — ED Provider Notes (Signed)
[facility]    CSN: [id number] Arrival date & time: 01/24/19  1149     History   Chief Complaint Chief Complaint  Patient presents with  . Chest Pain    HPI Kevin Montgomery is a 53 y.o. male.   53 yo male with insulin requiring diabetes type 2, hyperlipidemia, and hypertension (per record; patient denies) presents with a c/o mid sternal chest pain/pressure since 10am this morning. States he was at work not exerting himself when pain started. States he drove home to rest but did not improve. Pain is 8/10. States last night he felt some left shoulder pain, but today denies having any arm pain, neck pain or jaw pain. Denies any shortness of breath.    Chest Pain   Past Medical History:  Diagnosis Date  . Arthritis   . Chronic kidney disease    h/o kidney stones  . Complication of anesthesia   . Diabetes mellitus without complication ([place])   . GERD (gastroesophageal reflux disease)    no meds  . Hypertension    pt denies and states that Lisinopril is for kidney protection only from diabetes  . PONV (postoperative nausea and vomiting)     Patient Active Problem List   Diagnosis Date Noted  . Encounter for screening colonoscopy   . Gastroesophageal reflux disease 12/05/2017  . Incarcerated umbilical hernia   . Umbilical hernia 83/15/1761  . Diastasis recti 10/08/2015  . Essential hypertension 04/21/2015  . Hyperlipidemia 04/21/2015    Past Surgical History:  Procedure Laterality Date  . COLONOSCOPY WITH PROPOFOL N/A 12/16/2018   Procedure: COLONOSCOPY WITH PROPOFOL;  Surgeon: Lucilla Lame, MD;  Location: [facility];  Service: Endoscopy;  Laterality: N/A;  Diabetic - insulin and oral meds  . KIDNEY STONE SURGERY    . LITHOTRIPSY    . UMBILICAL HERNIA REPAIR N/A 12/20/2015   Procedure: HERNIA REPAIR UMBILICAL ADULT;  Surgeon: Hubbard Robinson, MD;  Location: [facility];  Service: General;  Laterality: N/A;       Home Medications    Prior to  Admission medications   Medication Sig Start Date End Date Taking? Authorizing Provider  aspirin 81 MG tablet Take 1 tablet (81 mg total) by mouth daily. 04/21/15  Yes Juline Patch, MD  Insulin Glargine (LANTUS) 100 UNIT/ML Solostar Pen Inject 20 Units into the skin daily. Patient taking differently: Inject 28 Units into the skin daily.  09/12/18  Yes Juline Patch, MD  JARDIANCE 10 MG TABS tablet TAKE ONE (1) TABLET BY MOUTH ONCE DAILY 12/17/18  Yes Juline Patch, MD  lisinopril (PRINIVIL,ZESTRIL) 5 MG tablet TAKE ONE TABLET BY MOUTH EVERY MORNING 07/18/18  Yes Juline Patch, MD  metFORMIN (GLUCOPHAGE) 500 MG tablet TAKE ONE TABLET BY MOUTH TWICE DAILY. 11/05/18  Yes Juline Patch, MD  pantoprazole (PROTONIX) 40 MG tablet TAKE (1) TABLET BY MOUTH EVERY DAY 01/20/19  Yes Juline Patch, MD  tamsulosin (FLOMAX) 0.4 MG CAPS capsule TAKE (1) CAPSULE BY MOUTH EVERY DAY 11/19/18  Yes Juline Patch, MD  Cinnamon 500 MG capsule Take 1,000 mg by mouth daily.    [provider]    Family History Family History  Problem Relation Age of Onset  . Hyperlipidemia Mother   . Cancer Father        Pancreatic  . Diabetes Father   . Cancer Paternal Uncle   . Cancer Paternal Grandmother     Social History Social History  Tobacco Use  . Smoking status: Never Smoker  . Smokeless tobacco: Never Used  Substance Use Topics  . Alcohol use: Yes    Alcohol/week: 0.0 standard drinks    Comment: rare  . Drug use: No     Allergies   Patient has no known allergies.   Review of Systems Review of Systems  Cardiovascular: Positive for chest pain.     Physical Exam Triage Vital Signs ED Triage Vitals  Enc Vitals Group     BP 01/24/19 1154 (!) 142/97     Pulse Rate 01/24/19 1154 89     Resp 01/24/19 1154 16     Temp 01/24/19 1154 98.5 F (36.9 C)     Temp Source 01/24/19 1154 Oral     SpO2 01/24/19 1154 98 %     Weight 01/24/19 1153 174 lb (78.9 kg)     Height 01/24/19 1153 5\' 6"   (1.676 m)     Head Circumference --      Peak Flow --      Pain Score 01/24/19 1153 9     Pain Loc --      Pain Edu? --      Excl. in [place]? --    No data found.  Updated Vital Signs BP (!) 142/97 (BP Location: Left Arm)   Pulse 89   Temp 98.5 F (36.9 C) (Oral)   Resp 16   Ht 5\' 6"  (1.676 m)   Wt 78.9 kg   SpO2 98%   BMI 28.08 kg/m   Visual Acuity Right Eye Distance:   Left Eye Distance:   Bilateral Distance:    Right Eye Near:   Left Eye Near:    Bilateral Near:     Physical Exam Vitals signs and nursing note reviewed.  Constitutional:      General: He is not in acute distress.    Appearance: He is not toxic-appearing or diaphoretic.  Cardiovascular:     Rate and Rhythm: Normal rate and regular rhythm.  Pulmonary:     Effort: Pulmonary effort is normal. No respiratory distress.     Breath sounds: Normal breath sounds. No stridor. No wheezing, rhonchi or rales.  Musculoskeletal:     Right lower leg: No edema.     Left lower leg: No edema.  Neurological:     Mental Status: He is alert.      UC Treatments / Results  Labs (all labs ordered are listed, but only abnormal results are displayed) Labs Reviewed - No data to display  EKG   Radiology No results found.  Procedures ED EKG  Date/Time: 01/24/2019 12:10 PM Performed by: Norval Gable, MD Authorized by: Norval Gable, MD   ECG reviewed by ED Physician in the absence of a cardiologist: yes   Previous ECG:    Previous ECG:  Compared to current   Similarity:  No change Interpretation:    Interpretation: normal   Rate:    ECG rate:  86   ECG rate assessment: normal   Rhythm:    Rhythm: sinus rhythm   Ectopy:    Ectopy: none   QRS:    QRS axis:  Normal   QRS intervals:  Normal Conduction:    Conduction: normal   ST segments:    ST segments:  Normal T waves:    T waves: normal     (including critical care time)  Medications Ordered in UC Medications  aspirin chewable tablet 324  mg (324 mg Oral Given  01/24/19 1202)  nitroGLYCERIN (NITROGLYN) 2 % ointment 0.5 inch (0.5 inches Topical Given 01/24/19 1213)    Initial Impression / Assessment and Plan / UC Course  I have reviewed the triage vital signs and the nursing notes.  Pertinent labs & imaging results that were available during my care of the patient were reviewed by me and considered in my medical decision making (see chart for details).      Final Clinical Impressions(s) / UC Diagnoses   Final diagnoses:  Mid sternal chest pain  Controlled type 2 diabetes mellitus without complication, with long-term current use of insulin (HCC)  Hyperlipidemia, unspecified hyperlipidemia type    ED Prescriptions    None      1. ekg result and possible diagnosis reviewed with patient; due to sudden onset of chest pressure several hours ago and multiple cardiac risk factors, recommend patient go to Emergency Department by EMS for further evaluation and management. Patient given ASA 324mg  plus 1/2 in NTG paste; IV saline lock. Patient in stable condition. Report called to [facility] ED charge RN.    Controlled Substance Prescriptions [place] Controlled Substance Registry consulted? Not Applicable   Norval Gable, MD 01/24/19 1228

## 2019-01-24 NOTE — ED Notes (Signed)
Family at bedside. Waiting on results of repeat troponin. No needs.

## 2019-01-24 NOTE — ED Triage Notes (Signed)
Patient complains of center of chest pain that started around 1 hour ago. Patient states that pain is severe.

## 2019-01-30 ENCOUNTER — Ambulatory Visit (INDEPENDENT_AMBULATORY_CARE_PROVIDER_SITE_OTHER): Payer: BLUE CROSS/BLUE SHIELD | Admitting: Family Medicine

## 2019-01-30 ENCOUNTER — Encounter: Payer: Self-pay | Admitting: Family Medicine

## 2019-01-30 ENCOUNTER — Other Ambulatory Visit: Payer: Self-pay

## 2019-01-30 VITALS — BP 120/70 | HR 80 | Ht 66.0 in | Wt 187.0 lb

## 2019-01-30 DIAGNOSIS — R1013 Epigastric pain: Secondary | ICD-10-CM

## 2019-01-30 DIAGNOSIS — G250 Essential tremor: Secondary | ICD-10-CM

## 2019-01-30 MED ORDER — METOPROLOL SUCCINATE ER 25 MG PO TB24: 25.0000 mg | ORAL_TABLET | Freq: Every day | ORAL | 3 refills | Status: DC

## 2019-01-30 MED ORDER — METOPROLOL SUCCINATE ER 25 MG PO TB24: 25.0000 mg | ORAL_TABLET | Freq: Every day | ORAL | 1 refills | Status: DC

## 2019-01-30 NOTE — Progress Notes (Signed)
Date:  01/30/2019   Name:  Kevin Montgomery   DOB:  10/30/1965   MRN:  [id number]   Chief Complaint: Follow-up (was seen in urgent care on the 14th- cleared by labs and EKG. Hurting at base of breast bone. ) and Tremors (getting worse/ Dr Melrose Nakayama put him on Gabapentin 200mg  bid and he said it didn't help- did not call Dr Melrose Nakayama)  Abdominal Pain This is a new problem. The current episode started in the past 7 days. The onset quality is sudden. The problem occurs every several days. Duration: 20-60 minutes. The problem has been unchanged. The pain is located in the epigastric region. The pain is at a severity of 8/10. The pain is moderate. The quality of the pain is sharp ("sharpe, hard pain"). The abdominal pain does not radiate. Pertinent negatives include no anorexia, arthralgias, belching, constipation, diarrhea, dysuria, fever, flatus, frequency, headaches, hematochezia, hematuria, melena, myalgias, nausea, vomiting or weight loss. Nothing aggravates the pain. The pain is relieved by being still and certain positions. He has tried proton pump inhibitors for the symptoms. The treatment provided no relief. There is no history of abdominal surgery, colon cancer, Crohn's disease, gallstones, GERD, pancreatitis, PUD or ulcerative colitis.  Neurologic Problem The patient's pertinent negatives include no altered mental status, clumsiness, focal sensory loss, focal weakness, loss of balance, memory loss, near-syncope, slurred speech, syncope, visual change or weakness. Primary symptoms comment: for tremor. This is a chronic problem. The current episode started more than 1 year ago. The problem has been gradually worsening since onset. There was left-sided focality noted. Associated symptoms include abdominal pain. Pertinent negatives include no auditory change, aura, back pain, bladder incontinence, bowel incontinence, chest pain, confusion, diaphoresis, dizziness, fatigue, fever, headaches, light-headedness,  nausea, neck pain, palpitations, shortness of breath, vertigo or vomiting. Treatments tried: gabapentin. The treatment provided no relief.    Review of Systems  Constitutional: Negative for chills, diaphoresis, fatigue, fever and weight loss.  HENT: Negative for drooling, ear discharge, ear pain and sore throat.   Respiratory: Negative for cough, choking, chest tightness, shortness of breath and wheezing.   Cardiovascular: Negative for chest pain, palpitations, leg swelling and near-syncope.  Gastrointestinal: Positive for abdominal pain. Negative for anorexia, blood in stool, bowel incontinence, constipation, diarrhea, flatus, hematochezia, melena, nausea and vomiting.       No dysphagia  Endocrine: Negative for polydipsia.  Genitourinary: Negative for bladder incontinence, dysuria, frequency, hematuria and urgency.  Musculoskeletal: Negative for arthralgias, back pain, myalgias and neck pain.  Skin: Negative for rash.  Allergic/Immunologic: Negative for environmental allergies.  Neurological: Positive for tremors. Negative for dizziness, vertigo, focal weakness, syncope, facial asymmetry, speech difficulty, weakness, light-headedness, numbness, headaches and loss of balance.  Hematological: Does not bruise/bleed easily.  Psychiatric/Behavioral: Negative for confusion, memory loss and suicidal ideas. The patient is not nervous/anxious.     Patient Active Problem List   Diagnosis Date Noted  . Encounter for screening colonoscopy   . Gastroesophageal reflux disease 12/05/2017  . Incarcerated umbilical hernia   . Umbilical hernia 27/25/3664  . Diastasis recti 10/08/2015  . Essential hypertension 04/21/2015  . Hyperlipidemia 04/21/2015    No Known Allergies  Past Surgical History:  Procedure Laterality Date  . COLONOSCOPY WITH PROPOFOL N/A 12/16/2018   Procedure: COLONOSCOPY WITH PROPOFOL;  Surgeon: Lucilla Lame, MD;  Location: [place];  Service: Endoscopy;  Laterality:  N/A;  Diabetic - insulin and oral meds  . KIDNEY STONE SURGERY    . LITHOTRIPSY    .  UMBILICAL HERNIA REPAIR N/A 12/20/2015   Procedure: HERNIA REPAIR UMBILICAL ADULT;  Surgeon: Hubbard Robinson, MD;  Location: [facility];  Service: General;  Laterality: N/A;    Social History   Tobacco Use  . Smoking status: Never Smoker  . Smokeless tobacco: Never Used  Substance Use Topics  . Alcohol use: Yes    Alcohol/week: 0.0 standard drinks    Comment: rare  . Drug use: No     Medication list has been reviewed and updated.  Current Meds  Medication Sig  . aspirin 81 MG tablet Take 1 tablet (81 mg total) by mouth daily.  . Insulin Glargine (LANTUS) 100 UNIT/ML Solostar Pen Inject 20 Units into the skin daily. (Patient taking differently: Inject 30 Units into the skin daily. )  . JARDIANCE 10 MG TABS tablet TAKE ONE (1) TABLET BY MOUTH ONCE DAILY  . lisinopril (PRINIVIL,ZESTRIL) 5 MG tablet TAKE ONE TABLET BY MOUTH EVERY MORNING  . metFORMIN (GLUCOPHAGE) 500 MG tablet TAKE ONE TABLET BY MOUTH TWICE DAILY.  . pantoprazole (PROTONIX) 40 MG tablet TAKE (1) TABLET BY MOUTH EVERY DAY  . tamsulosin (FLOMAX) 0.4 MG CAPS capsule TAKE (1) CAPSULE BY MOUTH EVERY DAY  . [DISCONTINUED] Cinnamon 500 MG capsule Take 1,000 mg by mouth daily.    PHQ 2/9 Scores 11/08/2018 07/18/2018 01/30/2017 10/26/2015  PHQ - 2 Score 0 0 0 0  PHQ- 9 Score 0 0 0 -    BP Readings from Last 3 Encounters:  01/30/19 120/70  01/24/19 97/62  01/24/19 (!) 142/97    Physical Exam Vitals signs and nursing note reviewed.  HENT:     Head: Normocephalic.     Right Ear: External ear normal.     Left Ear: External ear normal.     Nose: Nose normal.  Eyes:     General: No scleral icterus.       Right eye: No discharge.        Left eye: No discharge.     Conjunctiva/sclera: Conjunctivae normal.     Pupils: Pupils are equal, round, and reactive to light.  Neck:     Musculoskeletal: Normal range of motion and neck supple.      Thyroid: No thyromegaly.     Vascular: No JVD.     Trachea: No tracheal deviation.  Cardiovascular:     Rate and Rhythm: Normal rate and regular rhythm.     Heart sounds: Normal heart sounds. No murmur. No friction rub. No gallop.   Pulmonary:     Effort: No respiratory distress.     Breath sounds: Normal breath sounds. No wheezing or rales.  Abdominal:     General: Bowel sounds are normal.     Palpations: Abdomen is soft. There is no mass.     Tenderness: There is abdominal tenderness. There is no guarding or rebound.     Hernia: No hernia is present.  Musculoskeletal: Normal range of motion.        General: No swelling, tenderness, deformity or signs of injury.     Right lower leg: No edema.     Left lower leg: No edema.  Lymphadenopathy:     Cervical: No cervical adenopathy.  Skin:    General: Skin is warm.     Findings: No rash.  Neurological:     Mental Status: He is alert and oriented to person, place, and time.     Cranial Nerves: No cranial nerve deficit, dysarthria or facial asymmetry.  Sensory: Sensation is intact.     Motor: Tremor present. No weakness, abnormal muscle tone or pronator drift.     Deep Tendon Reflexes: Reflexes are normal and symmetric.     Wt Readings from Last 3 Encounters:  01/30/19 187 lb (84.8 kg)  01/24/19 174 lb (78.9 kg)  01/24/19 174 lb (78.9 kg)    BP 120/70   Pulse 80   Ht 5\' 6"  (1.676 m)   Wt 187 lb (84.8 kg)   BMI 30.18 kg/m   Assessment and Plan:   1. Epigastric pain Patient was recently evaluated for chest pain because of this epigastric pain that radiated up in the emergency room.  Review of lab work and evaluation suggested GERD and patient has been continuing on current medication however patient is continued to have this discomfort which was rather remarkable earlier this week on the way home from the beach.  Pain is consistent with cholecystitis and gallbladder colic.  Will evaluate with a limited ultrasound of the  right upper quadrant and epigastric area to evaluate liver, pancreas, gallbladder.  Review of labs makes it unreliable likely to be pancreatic in nature however there was noted to be an elevation of the total cholesterol particularly of the indirect. - US Abdomen Limited RUQ; Future  2. Benign essential tremor Patient continues to have a tremor and was evaluated by neurology and has an excellent work-up.  At this point in time he is frustrated that it has not seemingly gotten better on the gabapentin and I explained to home that this was the initial attempt of a medication and will try and match up side effect possibilities with benefit.  Patient seems to understand.  So our plan now is to initiate on metoprolol ER 25 low-dose to see  if he can tolerate and to if he notices any benefit.  If there is no improvement of the symptoms are next step will be to return to neurology for evaluation and further treatment. - metoprolol succinate (TOPROL-XL) 25 MG 24 hr tablet; Take 1 tablet (25 mg total) by mouth daily.  Dispense: 30 tablet; Refill: 1

## 2019-01-30 NOTE — Patient Instructions (Signed)

## 2019-02-04 ENCOUNTER — Ambulatory Visit
Admission: RE | Admit: 2019-02-04 | Discharge: 2019-02-04 | Disposition: A | Discharge status: Home / Self Care | Payer: BLUE CROSS/BLUE SHIELD | Source: Ambulatory Visit | Attending: Family Medicine | Admitting: Family Medicine

## 2019-02-04 ENCOUNTER — Other Ambulatory Visit: Payer: Self-pay

## 2019-02-04 DIAGNOSIS — R1011 Right upper quadrant pain: Secondary | ICD-10-CM

## 2019-02-04 DIAGNOSIS — R1013 Epigastric pain: Secondary | ICD-10-CM | POA: Insufficient documentation

## 2019-02-04 DIAGNOSIS — K760 Fatty (change of) liver, not elsewhere classified: Secondary | ICD-10-CM

## 2019-02-04 NOTE — Progress Notes (Unsigned)
Refer GI 

## 2019-02-07 ENCOUNTER — Ambulatory Visit: Payer: BLUE CROSS/BLUE SHIELD | Admitting: Family Medicine

## 2019-02-07 ENCOUNTER — Encounter: Payer: Self-pay | Admitting: Family Medicine

## 2019-02-07 ENCOUNTER — Other Ambulatory Visit: Payer: Self-pay

## 2019-02-07 VITALS — BP 120/78 | HR 83 | Ht 66.0 in | Wt 186.0 lb

## 2019-02-07 DIAGNOSIS — M60862 Other myositis, left lower leg: Secondary | ICD-10-CM

## 2019-02-07 MED ORDER — DICLOFENAC SODIUM 1 % TD GEL: 2.0000 g | Freq: Four times a day (QID) | TRANSDERMAL | 1 refills | Status: DC

## 2019-02-07 NOTE — Patient Instructions (Signed)
Sh[facility] Splints Rehab Ask your health care provider which exercises are safe for you. Do exercises exactly as told by your health care provider and adjust them as directed. It is normal to feel mild stretching, pulling, tightness, or discomfort as you do these exercises. Stop right away if you feel sudden pain or your pain gets worse. Do not begin these exercises until told by your health care provider. Stretching and range-of-motion exercise This exercise warms up your muscles and joints and improves the movement and flexibility of your lower leg. This exercise also helps to relieve pain. Calf stretch, standing  1. Stand with the ball of your left / right foot on a step. The ball of your foot is on the walking surface, right under your toes. 2. Keep your other foot firmly on the same step. 3. Hold on to the wall, a railing, or a chair for balance. 4. Slowly lift your other foot, allowing your body weight to press your left / right heel down over the edge of the step. You should feel a stretch in your left / right calf. 5. Hold this position for __________ seconds. 6. Repeat this exercise with a slight bend in your left / right knee. Repeat __________ times with your left / right knee straight and __________ times with your left / right knee bent. Complete this exercise __________ times a day. Strengthening exercises These exercises build strength and endurance in your lower leg. Endurance is the ability to use your muscles for a long time, even after they get tired. Dorsiflexion with band  1. Secure a rubber exercise band or tubing to a fixed object, such as a table leg or a pole. 2. Secure the other end of the band around your left / right foot. 3. Sit on the floor, facing the fixed object with your left / right leg extended. The band should be slightly tense when your foot is relaxed. 4. Slowly use your ankle muscles to pull your foot toward you (dorsiflexion). 5. Hold this position for  __________ seconds. 6. Slowly release the tension in the band and return your foot to the starting position. Repeat __________ times. Complete this exercise __________ times a day. Ankle eversion with band 1. Secure one end of a rubber exercise band or tubing to a fixed object, such as a table leg or a pole, that will stay in place when the band is pulled. 2. Loop the other end of the band around the middle of your left / right foot. 3. Sit on the floor, facing the fixed object. The band should be slightly tense when your foot is relaxed. 4. Make fists with your hands and put them between your knees. This will focus your strengthening at your ankle. 5. Leading with your little toe, slowly push your banded foot outward, away from your other leg (eversion). Make sure the band is positioned to resist the entire motion. 6. Hold this position for __________ seconds. 7. Control the tension in the band as you slowly return your foot to the starting position. Repeat __________ times. Complete this exercise __________ times a day. Ankle inversion with band 1. Secure one end of a rubber exercise band or tubing to a fixed object, such as a table leg or a pole, that will stay in place when the band is pulled. 2. Loop the other end of the band around your left / right foot, just below your toes. 3. Sit on the floor, facing the fixed object. The  band should be slightly tense when your foot is relaxed. 4. Make fists with your hands and put them between your knees. This will focus your strengthening at your ankle. 5. Leading with your big toe, slowly pull your banded foot inward, toward your other leg (inversion). Make sure the band is positioned to resist the entire motion. 6. Hold this position for __________ seconds. 7. Control the tension in the band as you slowly return your foot to the starting position. Repeat __________ times. Complete this exercise __________ times a day. Lateral walking with band  This is an exercise in which you walk sideways (lateral), with tension provided by an exercise band. 1. Stand in a long hallway. 2. Wrap a loop of exercise band around your legs, just above your knees. 3. Bend your knees gently and drop your hips down and back so your weight is over your heels. 4. Step to the side to move down the length of the hallway, keeping your toes pointed forward and keeping tension in the band. 5. Repeat, leading with your other leg. Repeat __________ times. Complete this exercise __________ times a day. Balance exercise This exercise will help improve your control of your foot and ankle when you are standing or walking. Single leg stance 1. Without wearing shoes, stand near a railing or in a doorway. You may hold on to the railing or door frame as needed. 2. Stand on your left / right foot. Keep your big toe down on the floor and try to keep your arch lifted. 3. If this exercise is too easy, you can try doing it with your eyes closed or while standing on a pillow. 4. Hold this position for __________ seconds. Repeat __________ times. Complete this exercise __________ times a day. This information is not intended to replace advice given to you by your health care provider. Make sure you discuss any questions you have with your health care provider. Document Released: 05/29/2005 Document Revised: 09/20/2018 Document Reviewed: 04/09/2018 Elsevier Patient Education  2020 [employer].

## 2019-02-07 NOTE — Progress Notes (Signed)
Date:  02/07/2019   Name:  Kevin Montgomery   DOB:  Nov 01, 1965   MRN:  [id number]   Chief Complaint: Leg Pain (Left leg pain- feels like someone is stabbing right below his knee. Lasts for 5-6 secs. This morning the pain was happenig every 15-20 mins. )  Leg Pain  The incident occurred 12 to 24 hours ago. The incident occurred at home. The pain is present in the right leg. The quality of the pain is described as aching. The pain is at a severity of 5/10. The pain is moderate. The pain has been fluctuating since onset. Pertinent negatives include no inability to bear weight, loss of motion, loss of sensation, muscle weakness, numbness or tingling. The symptoms are aggravated by palpation. He has tried nothing for the symptoms.    Review of Systems  Constitutional: Negative for chills and fever.  HENT: Negative for drooling, ear discharge, ear pain and sore throat.   Respiratory: Negative for cough, shortness of breath and wheezing.   Cardiovascular: Negative for chest pain, palpitations and leg swelling.  Gastrointestinal: Negative for abdominal pain, blood in stool, constipation, diarrhea and nausea.  Endocrine: Negative for polydipsia.  Genitourinary: Negative for dysuria, frequency, hematuria and urgency.  Musculoskeletal: Negative for back pain, myalgias and neck pain.  Skin: Negative for rash.  Allergic/Immunologic: Negative for environmental allergies.  Neurological: Negative for dizziness, tingling, numbness and headaches.  Hematological: Does not bruise/bleed easily.  Psychiatric/Behavioral: Negative for suicidal ideas. The patient is not nervous/anxious.     Patient Active Problem List   Diagnosis Date Noted  . Encounter for screening colonoscopy   . Gastroesophageal reflux disease 12/05/2017  . Incarcerated umbilical hernia   . Umbilical hernia [id number]  . Diastasis recti 10/08/2015  . Essential hypertension 04/21/2015  . Hyperlipidemia 04/21/2015    No Known  Allergies  Past Surgical History:  Procedure Laterality Date  . COLONOSCOPY WITH PROPOFOL N/A 12/16/2018   Procedure: COLONOSCOPY WITH PROPOFOL;  Surgeon: Lucilla Lame, MD;  Location: [place];  Service: Endoscopy;  Laterality: N/A;  Diabetic - insulin and oral meds  . KIDNEY STONE SURGERY    . LITHOTRIPSY    . UMBILICAL HERNIA REPAIR N/A 12/20/2015   Procedure: HERNIA REPAIR UMBILICAL ADULT;  Surgeon: Hubbard Robinson, MD;  Location: [facility];  Service: General;  Laterality: N/A;    Social History   Tobacco Use  . Smoking status: Never Smoker  . Smokeless tobacco: Never Used  Substance Use Topics  . Alcohol use: Yes    Alcohol/week: 0.0 standard drinks    Comment: rare  . Drug use: No     Medication list has been reviewed and updated.  Current Meds  Medication Sig  . aspirin 81 MG tablet Take 1 tablet (81 mg total) by mouth daily.  . Insulin Glargine (LANTUS) 100 UNIT/ML Solostar Pen Inject 20 Units into the skin daily. (Patient taking differently: Inject 30 Units into the skin daily. )  . JARDIANCE 10 MG TABS tablet TAKE ONE (1) TABLET BY MOUTH ONCE DAILY  . lisinopril (PRINIVIL,ZESTRIL) 5 MG tablet TAKE ONE TABLET BY MOUTH EVERY MORNING  . metFORMIN (GLUCOPHAGE) 500 MG tablet TAKE ONE TABLET BY MOUTH TWICE DAILY.  . metoprolol succinate (TOPROL-XL) 25 MG 24 hr tablet Take 1 tablet (25 mg total) by mouth daily.  . pantoprazole (PROTONIX) 40 MG tablet TAKE (1) TABLET BY MOUTH EVERY DAY  . tamsulosin (FLOMAX) 0.4 MG CAPS capsule TAKE (1) CAPSULE BY MOUTH EVERY  DAY    PHQ 2/9 Scores 11/08/2018 07/18/2018 01/30/2017 10/26/2015  PHQ - 2 Score 0 0 0 0  PHQ- 9 Score 0 0 0 -    BP Readings from Last 3 Encounters:  02/07/19 120/78  01/30/19 120/70  01/24/19 97/62    Physical Exam Vitals signs and nursing note reviewed.  HENT:     Head: Normocephalic.     Right Ear: External ear normal.     Left Ear: External ear normal.     Nose: Nose normal.  Eyes:      General: No scleral icterus.       Right eye: No discharge.        Left eye: No discharge.     Conjunctiva/sclera: Conjunctivae normal.     Pupils: Pupils are equal, round, and reactive to light.  Neck:     Musculoskeletal: Normal range of motion and neck supple.     Thyroid: No thyromegaly.     Vascular: No JVD.     Trachea: No tracheal deviation.  Cardiovascular:     Rate and Rhythm: Normal rate and regular rhythm.     Heart sounds: Normal heart sounds. No murmur. No friction rub. No gallop.   Pulmonary:     Effort: No respiratory distress.     Breath sounds: Normal breath sounds. No wheezing or rales.  Abdominal:     General: Bowel sounds are normal.     Palpations: Abdomen is soft. There is no mass.     Tenderness: There is no abdominal tenderness. There is no guarding or rebound.  Musculoskeletal: Normal range of motion.     Left lower leg: He exhibits tenderness and bony tenderness. He exhibits no swelling and no deformity. No edema.       Legs:  Lymphadenopathy:     Cervical: No cervical adenopathy.  Skin:    General: Skin is warm.     Findings: No rash.  Neurological:     Mental Status: He is alert and oriented to person, place, and time.     Cranial Nerves: No cranial nerve deficit.     Deep Tendon Reflexes: Reflexes are normal and symmetric.     Wt Readings from Last 3 Encounters:  02/07/19 186 lb (84.4 kg)  01/30/19 187 lb (84.8 kg)  01/24/19 174 lb (78.9 kg)    BP 120/78   Pulse 83   Ht 5\' 6"  (1.676 m)   Wt 186 lb (84.4 kg)   SpO2 98%   BMI 30.02 kg/m   Assessment and Plan: 1. Myositis of left lower leg, unspecified myositis type Patient has point tenderness on the lateral aspect of the proximal tibia tibialis insertion.  There is no bruise but this may suggest a periosteal or a shinsplints-like tear of the muscle along the edge of the tibia.  Patient was instructed to use ice if this should be reoccurring and we initiated some Voltaren gel to use and  point tenderness placed for the time being.  Patient was given rehab information on shin splints that can be used on a as needed basis if the pain should resume or on a prophylactic nature. - diclofenac sodium (VOLTAREN) 1 % GEL; Apply 2 g topically 4 (four) times daily.  Dispense: 50 g; Refill: 1

## 2019-02-18 ENCOUNTER — Other Ambulatory Visit: Payer: Self-pay | Admitting: Family Medicine

## 2019-02-18 DIAGNOSIS — E119 Type 2 diabetes mellitus without complications: Secondary | ICD-10-CM

## 2019-03-11 ENCOUNTER — Ambulatory Visit: Payer: BLUE CROSS/BLUE SHIELD | Admitting: Family Medicine

## 2019-03-12 ENCOUNTER — Encounter: Payer: Self-pay | Admitting: Family Medicine

## 2019-03-12 ENCOUNTER — Ambulatory Visit: Payer: BLUE CROSS/BLUE SHIELD | Admitting: Family Medicine

## 2019-03-12 ENCOUNTER — Other Ambulatory Visit: Payer: Self-pay

## 2019-03-12 VITALS — BP 134/100 | HR 88 | Ht 66.0 in | Wt 186.0 lb

## 2019-03-12 DIAGNOSIS — I10 Essential (primary) hypertension: Secondary | ICD-10-CM

## 2019-03-12 DIAGNOSIS — N401 Enlarged prostate with lower urinary tract symptoms: Secondary | ICD-10-CM

## 2019-03-12 DIAGNOSIS — Z23 Encounter for immunization: Secondary | ICD-10-CM

## 2019-03-12 DIAGNOSIS — E119 Type 2 diabetes mellitus without complications: Secondary | ICD-10-CM

## 2019-03-12 DIAGNOSIS — R350 Frequency of micturition: Secondary | ICD-10-CM

## 2019-03-12 DIAGNOSIS — E782 Mixed hyperlipidemia: Secondary | ICD-10-CM

## 2019-03-12 DIAGNOSIS — G250 Essential tremor: Secondary | ICD-10-CM | POA: Diagnosis not present

## 2019-03-12 DIAGNOSIS — K219 Gastro-esophageal reflux disease without esophagitis: Secondary | ICD-10-CM

## 2019-03-12 MED ORDER — INSULIN GLARGINE 100 UNIT/ML SOLOSTAR PEN: 30.0000 [IU] | PEN_INJECTOR | Freq: Every day | SUBCUTANEOUS | 1 refills | Status: DC

## 2019-03-12 MED ORDER — TAMSULOSIN HCL 0.4 MG PO CAPS: ORAL_CAPSULE | ORAL | 1 refills | Status: DC

## 2019-03-12 MED ORDER — JARDIANCE 10 MG PO TABS: ORAL_TABLET | ORAL | 5 refills | Status: DC

## 2019-03-12 MED ORDER — METFORMIN HCL 500 MG PO TABS: 500.0000 mg | ORAL_TABLET | Freq: Two times a day (BID) | ORAL | 1 refills | Status: DC

## 2019-03-12 MED ORDER — PANTOPRAZOLE SODIUM 40 MG PO TBEC: DELAYED_RELEASE_TABLET | ORAL | 1 refills | Status: DC

## 2019-03-12 MED ORDER — LISINOPRIL 5 MG PO TABS: ORAL_TABLET | ORAL | 1 refills | Status: DC

## 2019-03-12 MED ORDER — METOPROLOL SUCCINATE ER 25 MG PO TB24: 25.0000 mg | ORAL_TABLET | Freq: Every day | ORAL | 1 refills | Status: DC

## 2019-03-12 NOTE — Progress Notes (Signed)
Date:  03/12/2019   Name:  Kevin Montgomery   DOB:  [birth year]   MRN:  [id number]   Chief Complaint: Diabetes (follow up)  Diabetes He presents for his follow-up diabetic visit. He has type 2 diabetes mellitus. His disease course has been stable. There are no hypoglycemic associated symptoms. Pertinent negatives for hypoglycemia include no dizziness, headaches or nervousness/anxiousness. Pertinent negatives for diabetes include no blurred vision, no chest pain, no fatigue, no foot paresthesias, no foot ulcerations, no polydipsia, no polyphagia, no polyuria, no visual change, no weakness and no weight loss. There are no hypoglycemic complications. Symptoms are stable. There are no diabetic complications. Risk factors for coronary artery disease include diabetes mellitus, hypertension, dyslipidemia, male sex and obesity. Current diabetic treatment includes insulin injections and oral agent (dual therapy). He is compliant with treatment most of the time. His weight is stable. Diabetic current diet: somewhere in between. Meal planning includes avoidance of concentrated sweets. He participates in exercise intermittently. His breakfast blood glucose range is generally 140-180 mg/dl. He does not see a podiatrist.Eye exam is not current.  Hyperlipidemia This is a chronic problem. The current episode started more than 1 year ago. The problem is controlled. Recent lipid tests were reviewed and are normal. He has no history of chronic renal disease, diabetes, hypothyroidism, liver disease, obesity or nephrotic syndrome. Pertinent negatives include no chest pain, focal sensory loss, focal weakness, leg pain, myalgias or shortness of breath. Current antihyperlipidemic treatment includes statins.  Male GU Problem This is a new problem. The current episode started more than 1 year ago. Pertinent negatives include no abdominal pain, chest pain, chills, constipation, coughing, diarrhea, dysuria, fever, flank pain,  frequency, headaches, nausea, painful intercourse, rash, shortness of breath, sore throat or urgency.  Hypertension This is a chronic problem. The problem has been gradually improving since onset. The problem is controlled. Pertinent negatives include no blurred vision, chest pain, headaches, neck pain, palpitations or shortness of breath. Risk factors for coronary artery disease include dyslipidemia and post-menopausal state. Past treatments include ACE inhibitors and beta blockers. The current treatment provides moderate improvement. There is no history of chronic renal disease.  Gastroesophageal Reflux He reports no abdominal pain, no chest pain, no coughing, no nausea, no sore throat or no wheezing. Pertinent negatives include no fatigue or weight loss.    Review of Systems  Constitutional: Negative for chills, fatigue, fever and weight loss.  HENT: Negative for drooling, ear discharge, ear pain and sore throat.   Eyes: Negative for blurred vision.  Respiratory: Negative for cough, shortness of breath and wheezing.   Cardiovascular: Negative for chest pain, palpitations and leg swelling.  Gastrointestinal: Negative for abdominal pain, blood in stool, constipation, diarrhea and nausea.  Endocrine: Negative for polydipsia, polyphagia and polyuria.  Genitourinary: Negative for dysuria, flank pain, frequency, hematuria and urgency.  Musculoskeletal: Negative for back pain, myalgias and neck pain.  Skin: Negative for rash.  Allergic/Immunologic: Negative for environmental allergies.  Neurological: Negative for dizziness, focal weakness, weakness and headaches.  Hematological: Does not bruise/bleed easily.  Psychiatric/Behavioral: Negative for suicidal ideas. The patient is not nervous/anxious.     Patient Active Problem List   Diagnosis Date Noted  . Encounter for screening colonoscopy   . Gastroesophageal reflux disease 12/05/2017  . Incarcerated umbilical hernia   . Umbilical hernia  [id number]  . Diastasis recti 10/08/2015  . Essential hypertension 04/21/2015  . Hyperlipidemia 04/21/2015    No Known Allergies  Past Surgical History:  Procedure Laterality Date  . COLONOSCOPY WITH PROPOFOL N/A 12/16/2018   Procedure: COLONOSCOPY WITH PROPOFOL;  Surgeon: Lucilla Lame, MD;  Location: [place];  Service: Endoscopy;  Laterality: N/A;  Diabetic - insulin and oral meds  . KIDNEY STONE SURGERY    . LITHOTRIPSY    . UMBILICAL HERNIA REPAIR N/A 12/20/2015   Procedure: HERNIA REPAIR UMBILICAL ADULT;  Surgeon: Hubbard Robinson, MD;  Location: [facility];  Service: General;  Laterality: N/A;    Social History   Tobacco Use  . Smoking status: Never Smoker  . Smokeless tobacco: Never Used  Substance Use Topics  . Alcohol use: Yes    Alcohol/week: 0.0 standard drinks    Comment: rare  . Drug use: No     Medication list has been reviewed and updated.  Current Meds  Medication Sig  . aspirin 81 MG tablet Take 1 tablet (81 mg total) by mouth daily.  . diclofenac sodium (VOLTAREN) 1 % GEL Apply 2 g topically 4 (four) times daily.  . Insulin Glargine (LANTUS) 100 UNIT/ML Solostar Pen Inject 20 Units into the skin daily. (Patient taking differently: Inject 30 Units into the skin daily. )  . JARDIANCE 10 MG TABS tablet TAKE (1) TABLET BY MOUTH EVERY DAY  . lisinopril (PRINIVIL,ZESTRIL) 5 MG tablet TAKE ONE TABLET BY MOUTH EVERY MORNING  . metFORMIN (GLUCOPHAGE) 500 MG tablet TAKE ONE TABLET BY MOUTH TWICE DAILY.  . metoprolol succinate (TOPROL-XL) 25 MG 24 hr tablet Take 1 tablet (25 mg total) by mouth daily.  . pantoprazole (PROTONIX) 40 MG tablet TAKE (1) TABLET BY MOUTH EVERY DAY  . tamsulosin (FLOMAX) 0.4 MG CAPS capsule TAKE (1) CAPSULE BY MOUTH EVERY DAY    PHQ 2/9 Scores 11/08/2018 07/18/2018 01/30/2017 10/26/2015  PHQ - 2 Score 0 0 0 0  PHQ- 9 Score 0 0 0 -    BP Readings from Last 3 Encounters:  03/12/19 (!) 134/100  02/07/19 120/78  01/30/19 120/70     Physical Exam Vitals signs and nursing note reviewed.  HENT:     Head: Normocephalic.     Right Ear: External ear normal.     Left Ear: External ear normal.     Nose: Nose normal.  Eyes:     General: No scleral icterus.       Right eye: No discharge.        Left eye: No discharge.     Conjunctiva/sclera: Conjunctivae normal.     Pupils: Pupils are equal, round, and reactive to light.  Neck:     Musculoskeletal: Normal range of motion and neck supple.     Thyroid: No thyromegaly.     Vascular: No JVD.     Trachea: No tracheal deviation.  Cardiovascular:     Rate and Rhythm: Normal rate and regular rhythm.     Pulses:          Dorsalis pedis pulses are 2+ on the right side and 2+ on the left side.       Posterior tibial pulses are 2+ on the right side and 2+ on the left side.     Heart sounds: Normal heart sounds. No murmur. No friction rub. No gallop.   Pulmonary:     Effort: No respiratory distress.     Breath sounds: Normal breath sounds. No wheezing or rales.  Abdominal:     General: Bowel sounds are normal.     Palpations: Abdomen is soft. There is no mass.  Tenderness: There is no abdominal tenderness. There is no guarding or rebound.  Musculoskeletal: Normal range of motion.        General: No tenderness.     Right foot: Normal range of motion.     Left foot: Normal range of motion.  Feet:     Right foot:     Protective Sensation: 10 sites tested. 10 sites sensed.     Skin integrity: Skin integrity normal.     Toenail Condition: Right toenails are normal.     Left foot:     Protective Sensation: 10 sites tested. 10 sites sensed.     Skin integrity: Skin integrity normal.     Toenail Condition: Left toenails are normal.  Lymphadenopathy:     Cervical: No cervical adenopathy.  Skin:    General: Skin is warm.     Findings: No rash.  Neurological:     Mental Status: He is alert and oriented to person, place, and time.     Cranial Nerves: No cranial nerve  deficit.     Deep Tendon Reflexes: Reflexes are normal and symmetric.     Wt Readings from Last 3 Encounters:  03/12/19 186 lb (84.4 kg)  02/07/19 186 lb (84.4 kg)  01/30/19 187 lb (84.8 kg)    BP (!) 134/100   Pulse 88   Ht 5\' 6"  (1.676 m)   Wt 186 lb (84.4 kg)   BMI 30.02 kg/m   Assessment and Plan: 1. Benign essential tremor Patient with history of benign essential tremor.  Patient continues to have some symptoms of tremor but not as bad since initiation of metoprolol XL 25 mg. - metoprolol succinate (TOPROL-XL) 25 MG 24 hr tablet; Take 1 tablet (25 mg total) by mouth daily.  Dispense: 90 tablet; Refill: 1  2. Benign prostatic hyperplasia with urinary frequency Patient with mild BPH symptoms which are controlled with medication.  Chronic.  Controlled.  Will continue tamsulosin 0.4 mg once a day. - tamsulosin (FLOMAX) 0.4 MG CAPS capsule; TAKE (1) CAPSULE BY MOUTH EVERY DAY  Dispense: 90 capsule; Refill: 1  3. Essential hypertension Chronic.  Controlled.  Continue metoprolol XL 25 mg once a day.  And lisinopril 5 mg once a day.  Patient had not been taking his medication today when we checked his blood pressure patient was encouraged to take it next time he comes. - metoprolol succinate (TOPROL-XL) 25 MG 24 hr tablet; Take 1 tablet (25 mg total) by mouth daily.  Dispense: 90 tablet; Refill: 1 - lisinopril (ZESTRIL) 5 MG tablet; TAKE ONE TABLET BY MOUTH EVERY MORNING  Dispense: 90 tablet; Refill: 1  4. Gastroesophageal reflux disease, esophagitis presence not specified Chronic.  Controlled.  Continue pantoprazole 40 mg once a day. - pantoprazole (PROTONIX) 40 MG tablet; TAKE (1) TABLET BY MOUTH EVERY DAY  Dispense: 90 tablet; Refill: 1  5. Type 2 diabetes mellitus without complication, without long-term current use of insulin ([place]) Patient states that he does not take his fasting blood sugars and when he last did a fasting glucose it was in the 170 range.  We will check a  microalbuminuria A1c and lipid panel.  We will continue Jardiance 10 mg once a day metformin 500 mg twice a day and Lantus basal insulin 30 units nightly.  Patient was encouraged to start checking blood sugars on a more regular basis. - HgB A1c - Microalbumin, urine - Lipid Panel With LDL/HDL Ratio - empagliflozin (JARDIANCE) 10 MG TABS tablet; TAKE (1) TABLET  BY MOUTH EVERY DAY  Dispense: 30 tablet; Refill: 5 - metFORMIN (GLUCOPHAGE) 500 MG tablet; Take 1 tablet (500 mg total) by mouth 2 (two) times daily.  Dispense: 180 tablet; Refill: 1 - Insulin Glargine (LANTUS) 100 UNIT/ML Solostar Pen; Inject 30 Units into the skin daily.  Dispense: 5 pen; Refill: 1  6. Mixed hyperlipidemia Chronic.  Controlled.  Will check lipid panel.  If LDH is elevated will consider starting a statin agent. - Lipid Panel With LDL/HDL Ratio  7. Influenza vaccine needed Discussed and administered - Flu Vaccine QUAD 6+ mos PF IM (Fluarix Quad PF)

## 2019-03-12 NOTE — Patient Instructions (Signed)
Mediterranean Diet A Mediterranean diet refers to food and lifestyle choices that are based on the traditions of countries located on the [employer]. This way of eating has been shown to help prevent certain conditions and improve outcomes for people who have chronic diseases, like kidney disease and heart disease. What are tips for following this plan? Lifestyle  Cook and eat meals together with your family, when possible.  Drink enough fluid to keep your urine clear or pale yellow.  Be physically active every day. This includes: ? Aerobic exercise like running or swimming. ? Leisure activities like gardening, walking, or housework.  Get 7-8 hours of sleep each night.  If recommended by your health care provider, drink red wine in moderation. This means 1 glass a day for nonpregnant women and 2 glasses a day for men. A glass of wine equals 5 oz (150 mL). Reading food labels   Check the serving size of packaged foods. For foods such as rice and pasta, the serving size refers to the amount of cooked product, not dry.  Check the total fat in packaged foods. Avoid foods that have saturated fat or trans fats.  Check the ingredients list for added sugars, such as corn syrup. Shopping  At the grocery store, buy most of your food from the areas near the walls of the store. This includes: ? Fresh fruits and vegetables (produce). ? Grains, beans, nuts, and seeds. Some of these may be available in unpackaged forms or large amounts (in bulk). ? Fresh seafood. ? Poultry and eggs. ? Low-fat dairy products.  Buy whole ingredients instead of prepackaged foods.  Buy fresh fruits and vegetables in-season from local farmers markets.  Buy frozen fruits and vegetables in resealable bags.  If you do not have access to quality fresh seafood, buy precooked frozen shrimp or canned fish, such as tuna, salmon, or sardines.  Buy small amounts of raw or cooked vegetables, salads, or olives from  the deli or salad bar at your store.  Stock your pantry so you always have certain foods on hand, such as olive oil, canned tuna, canned tomatoes, rice, pasta, and beans. Cooking  Cook foods with extra-virgin olive oil instead of using butter or other vegetable oils.  Have meat as a side dish, and have vegetables or grains as your main dish. This means having meat in small portions or adding small amounts of meat to foods like pasta or stew.  Use beans or vegetables instead of meat in common dishes like chili or lasagna.  Experiment with different cooking methods. Try roasting or broiling vegetables instead of steaming or sauteing them.  Add frozen vegetables to soups, stews, pasta, or rice.  Add nuts or seeds for added healthy fat at each meal. You can add these to yogurt, salads, or vegetable dishes.  Marinate fish or vegetables using olive oil, lemon juice, garlic, and fresh herbs. Meal planning   Plan to eat 1 vegetarian meal one day each week. Try to work up to 2 vegetarian meals, if possible.  Eat seafood 2 or more times a week.  Have healthy snacks readily available, such as: ? Vegetable sticks with hummus. ? [place] yogurt. ? Fruit and nut trail mix.  Eat balanced meals throughout the week. This includes: ? Fruit: 2-3 servings a day ? Vegetables: 4-5 servings a day ? Low-fat dairy: 2 servings a day ? Fish, poultry, or lean meat: 1 serving a day ? Beans and legumes: 2 or more servings a week ?  Nuts and seeds: 1-2 servings a day ? Whole grains: 6-8 servings a day ? Extra-virgin olive oil: 3-4 servings a day  Limit red meat and sweets to only a few servings a month What are my food choices?  Mediterranean diet ? Recommended  Grains: Whole-grain pasta. Brown rice. Bulgar wheat. Polenta. Couscous. Whole-wheat bread. Oatmeal. Quinoa.  Vegetables: Artichokes. Beets. Broccoli. Cabbage. Carrots. Eggplant. Green beans. Chard. Kale. Spinach. Onions. Leeks. Peas. Squash.  Tomatoes. Peppers. Radishes.  Fruits: Apples. Apricots. Avocado. Berries. Bananas. Cherries. Dates. Figs. Grapes. Lemons. Melon. Oranges. Peaches. Plums. Pomegranate.  Meats and other protein foods: Beans. Almonds. Sunflower seeds. Pine nuts. Peanuts. Cod. Salmon. Scallops. Shrimp. Tuna. Tilapia. Clams. Oysters. Eggs.  Dairy: Low-fat milk. Cheese. Greek yogurt.  Beverages: Water. Red wine. Herbal tea.  Fats and oils: Extra virgin olive oil. Avocado oil. Grape seed oil.  Sweets and desserts: Greek yogurt with honey. Baked apples. Poached pears. Trail mix.  Seasoning and other foods: Basil. Cilantro. Coriander. Cumin. Mint. Parsley. Sage. Rosemary. Tarragon. Garlic. Oregano. Thyme. Pepper. Balsalmic vinegar. Tahini. Hummus. Tomato sauce. Olives. Mushrooms. ? Limit these  Grains: Prepackaged pasta or rice dishes. Prepackaged cereal with added sugar.  Vegetables: Deep fried potatoes (french fries).  Fruits: Fruit canned in syrup.  Meats and other protein foods: Beef. Pork. Lamb. Poultry with skin. Hot dogs. Bacon.  Dairy: Ice cream. Sour cream. Whole milk.  Beverages: Juice. Sugar-sweetened soft drinks. Beer. Liquor and spirits.  Fats and oils: Butter. Canola oil. Vegetable oil. Beef fat (tallow). Lard.  Sweets and desserts: Cookies. Cakes. Pies. Candy.  Seasoning and other foods: Mayonnaise. Premade sauces and marinades. The items listed may not be a complete list. Talk with your dietitian about what dietary choices are right for you. Summary  The Mediterranean diet includes both food and lifestyle choices.  Eat a variety of fresh fruits and vegetables, beans, nuts, seeds, and whole grains.  Limit the amount of red meat and sweets that you eat.  Talk with your health care provider about whether it is safe for you to drink red wine in moderation. This means 1 glass a day for nonpregnant women and 2 glasses a day for men. A glass of wine equals 5 oz (150 mL). This information  is not intended to replace advice given to you by your health care provider. Make sure you discuss any questions you have with your health care provider. Document Released: 01/20/2016 Document Revised: 01/27/2016 Document Reviewed: 01/20/2016 Elsevier Patient Education  2020 [employer]  

## 2019-03-13 LAB — LIPID PANEL WITH LDL/HDL RATIO
Cholesterol, Total: 184 mg/dL (ref 100–199)
HDL: 35 mg/dL — ABNORMAL LOW (ref >39)
LDL Chol Calc (NIH): 112 mg/dL — ABNORMAL HIGH (ref 0–99)
LDL/HDL Ratio: 3.2 ratio (ref 0.0–3.6)
Triglycerides: 211 mg/dL — ABNORMAL HIGH (ref 0–149)
VLDL Cholesterol Cal: 37 mg/dL (ref 5–40)

## 2019-03-13 LAB — HEMOGLOBIN A1C
Est. average glucose Bld gHb Est-mCnc: 174 mg/dL
Hgb A1c MFr Bld: 7.7 % — ABNORMAL HIGH (ref 4.8–5.6)

## 2019-03-13 LAB — MICROALBUMIN, URINE: Microalbumin, Urine: 9.5 ug/mL

## 2019-03-14 ENCOUNTER — Other Ambulatory Visit: Payer: Self-pay

## 2019-03-14 DIAGNOSIS — E119 Type 2 diabetes mellitus without complications: Secondary | ICD-10-CM

## 2019-03-14 MED ORDER — INSULIN GLARGINE 100 UNIT/ML SOLOSTAR PEN: 34.0000 [IU] | PEN_INJECTOR | Freq: Every day | SUBCUTANEOUS | 1 refills | Status: DC

## 2019-04-25 ENCOUNTER — Ambulatory Visit: Payer: BLUE CROSS/BLUE SHIELD | Admitting: Family Medicine

## 2019-04-30 ENCOUNTER — Ambulatory Visit: Payer: BLUE CROSS/BLUE SHIELD | Admitting: Family Medicine

## 2019-05-01 ENCOUNTER — Other Ambulatory Visit: Payer: Self-pay

## 2019-05-01 ENCOUNTER — Encounter: Payer: Self-pay | Admitting: Family Medicine

## 2019-05-01 ENCOUNTER — Ambulatory Visit: Payer: BLUE CROSS/BLUE SHIELD | Admitting: Family Medicine

## 2019-05-01 VITALS — BP 130/78 | HR 80 | Ht 66.0 in | Wt 187.0 lb

## 2019-05-01 DIAGNOSIS — S4431XA Injury of axillary nerve, right arm, initial encounter: Secondary | ICD-10-CM

## 2019-05-01 DIAGNOSIS — F5101 Primary insomnia: Secondary | ICD-10-CM

## 2019-05-01 MED ORDER — PREDNISONE 10 MG PO TABS: 10.0000 mg | ORAL_TABLET | Freq: Every day | ORAL | 0 refills | Status: DC

## 2019-05-01 MED ORDER — TRAZODONE HCL 50 MG PO TABS: 25.0000 mg | ORAL_TABLET | Freq: Every evening | ORAL | 3 refills | Status: DC | PRN

## 2019-05-01 MED ORDER — MELOXICAM 15 MG PO TABS: 15.0000 mg | ORAL_TABLET | Freq: Every day | ORAL | 0 refills | Status: DC

## 2019-05-01 NOTE — Progress Notes (Signed)
Date:  05/01/2019   Name:  Kevin Montgomery   DOB:  1965/07/28   MRN:  [id number]   Chief Complaint: Shoulder Pain (dog pulled him when walking- can lift arm. When pulled or laying down at night- hurts)  Shoulder Pain  The pain is present in the right shoulder. This is a new problem. The current episode started 1 to 4 weeks ago (10 days). The problem occurs intermittently. The problem has been waxing and waning. The quality of the pain is described as aching. The pain is moderate. Associated symptoms include numbness and tingling. Pertinent negatives include no fever, joint locking, joint swelling, limited range of motion or stiffness. The symptoms are aggravated by activity (pulling straight forward). He has tried NSAIDS (topical voltareen/advil) for the symptoms. The treatment provided no relief. Family history does not include gout or rheumatoid arthritis. There is no history of diabetes, gout, osteoarthritis or rheumatoid arthritis.    Lab Results  Component Value Date   CREATININE 0.81 01/24/2019   BUN 13 01/24/2019   NA 135 01/24/2019   K 3.6 01/24/2019   CL 101 01/24/2019   CO2 22 01/24/2019   Lab Results  Component Value Date   CHOL 184 03/12/2019   HDL 35 (L) 03/12/2019   LDLCALC 112 (H) 03/12/2019   TRIG 211 (H) 03/12/2019   CHOLHDL 3.8 11/08/2018   No results found for: TSH Lab Results  Component Value Date   HGBA1C 7.7 (H) 03/12/2019     Review of Systems  Constitutional: Negative for chills and fever.  HENT: Negative for drooling, ear discharge, ear pain and sore throat.   Respiratory: Negative for cough, shortness of breath and wheezing.   Cardiovascular: Negative for chest pain, palpitations and leg swelling.  Gastrointestinal: Negative for abdominal pain, blood in stool, constipation, diarrhea and nausea.  Endocrine: Negative for polydipsia.  Genitourinary: Negative for dysuria, frequency, hematuria and urgency.  Musculoskeletal: Positive for arthralgias  and myalgias. Negative for back pain, gout, neck pain and stiffness.  Skin: Negative for rash.  Allergic/Immunologic: Negative for environmental allergies.  Neurological: Positive for tingling and numbness. Negative for dizziness and headaches.  Hematological: Does not bruise/bleed easily.  Psychiatric/Behavioral: Negative for suicidal ideas. The patient is not nervous/anxious.     Patient Active Problem List   Diagnosis Date Noted  . Encounter for screening colonoscopy   . Gastroesophageal reflux disease 12/05/2017  . Incarcerated umbilical hernia   . Umbilical hernia [id number]  . Diastasis recti 10/08/2015  . Essential hypertension 04/21/2015  . Hyperlipidemia 04/21/2015    No Known Allergies  Past Surgical History:  Procedure Laterality Date  . COLONOSCOPY WITH PROPOFOL N/A 12/16/2018   Procedure: COLONOSCOPY WITH PROPOFOL;  Surgeon: Lucilla Lame, MD;  Location: [place];  Service: Endoscopy;  Laterality: N/A;  Diabetic - insulin and oral meds  . KIDNEY STONE SURGERY    . LITHOTRIPSY    . UMBILICAL HERNIA REPAIR N/A 12/20/2015   Procedure: HERNIA REPAIR UMBILICAL ADULT;  Surgeon: Hubbard Robinson, MD;  Location: [facility];  Service: General;  Laterality: N/A;    Social History   Tobacco Use  . Smoking status: Never Smoker  . Smokeless tobacco: Never Used  Substance Use Topics  . Alcohol use: Yes    Alcohol/week: 0.0 standard drinks    Comment: rare  . Drug use: No     Medication list has been reviewed and updated.  Current Meds  Medication Sig  . aspirin 81 MG tablet Take  1 tablet (81 mg total) by mouth daily.  . empagliflozin (JARDIANCE) 10 MG TABS tablet TAKE (1) TABLET BY MOUTH EVERY DAY  . Insulin Glargine (LANTUS) 100 UNIT/ML Solostar Pen Inject 34 Units into the skin daily.  Marland Kitchen lisinopril (ZESTRIL) 5 MG tablet TAKE ONE TABLET BY MOUTH EVERY MORNING  . metFORMIN (GLUCOPHAGE) 500 MG tablet Take 1 tablet (500 mg total) by mouth 2 (two) times daily.   . metoprolol succinate (TOPROL-XL) 25 MG 24 hr tablet Take 1 tablet (25 mg total) by mouth daily.  . pantoprazole (PROTONIX) 40 MG tablet TAKE (1) TABLET BY MOUTH EVERY DAY  . tamsulosin (FLOMAX) 0.4 MG CAPS capsule TAKE (1) CAPSULE BY MOUTH EVERY DAY    PHQ 2/9 Scores 11/08/2018 07/18/2018 01/30/2017 10/26/2015  PHQ - 2 Score 0 0 0 0  PHQ- 9 Score 0 0 0 -    BP Readings from Last 3 Encounters:  05/01/19 130/78  03/12/19 (!) 134/100  02/07/19 120/78    Physical Exam Vitals signs and nursing note reviewed.  HENT:     Head: Normocephalic.     Right Ear: External ear normal.     Left Ear: External ear normal.     Nose: Nose normal.  Eyes:     General: No scleral icterus.       Right eye: No discharge.        Left eye: No discharge.     Conjunctiva/sclera: Conjunctivae normal.     Pupils: Pupils are equal, round, and reactive to light.  Neck:     Musculoskeletal: Normal range of motion and neck supple.     Thyroid: No thyromegaly.     Vascular: No JVD.     Trachea: No tracheal deviation.  Cardiovascular:     Rate and Rhythm: Normal rate and regular rhythm.     Heart sounds: Normal heart sounds. No murmur. No friction rub. No gallop.   Pulmonary:     Effort: No respiratory distress.     Breath sounds: Normal breath sounds. No wheezing or rales.  Abdominal:     General: Bowel sounds are normal.     Palpations: Abdomen is soft. There is no mass.     Tenderness: There is no abdominal tenderness. There is no guarding or rebound.  Musculoskeletal: Normal range of motion.        General: No tenderness.  Lymphadenopathy:     Cervical: No cervical adenopathy.  Skin:    General: Skin is warm.     Findings: No rash.  Neurological:     Mental Status: He is alert and oriented to person, place, and time.     Cranial Nerves: No cranial nerve deficit.     Sensory: Sensation is intact.     Motor: Motor function is intact.     Deep Tendon Reflexes: Reflexes are normal and symmetric.      Reflex Scores:      Tricep reflexes are 2+ on the right side and 2+ on the left side.      Bicep reflexes are 2+ on the right side and 2+ on the left side.      Brachioradialis reflexes are 2+ on the right side and 2+ on the left side.      Patellar reflexes are 2+ on the right side and 2+ on the left side.      Achilles reflexes are 2+ on the right side and 2+ on the left side.    Wt Readings from Last 3  Encounters:  05/01/19 187 lb (84.8 kg)  03/12/19 186 lb (84.4 kg)  02/07/19 186 lb (84.4 kg)    BP 130/78   Pulse 80   Ht 5\' 6"  (1.676 m)   Wt 187 lb (84.8 kg)   BMI 30.18 kg/m   Assessment and Plan: 1. Injury of right axillary nerve, initial encounter Patient had an incident when large dog's on leash lunges forward after some leaf and jerks his arm straight ahead with significant pain and resultant paresthesias in the median nerve distribution.  I think this is likely due to an axillary nerve injury which may take weeks to months patient has no loss of neurologic function motor or sensory or reflex.  We will forego the x-ray the because this is likely a soft tissue concern.  We will initiate some meloxicam and prednisone just 10 mg given his diabetic situation. - meloxicam (MOBIC) 15 MG tablet; Take 1 tablet (15 mg total) by mouth daily.  Dispense: 30 tablet; Refill: 0 - predniSONE (DELTASONE) 10 MG tablet; Take 1 tablet (10 mg total) by mouth daily with breakfast.  Dispense: 30 tablet; Refill: 0  2. Primary insomnia Patient has some primary insomnia and we will initiate trazodone 50 mg 1/2-1 as needed. - traZODone (DESYREL) 50 MG tablet; Take 0.5-1 tablets (25-50 mg total) by mouth at bedtime as needed for sleep.  Dispense: 30 tablet; Refill: 3

## 2019-05-01 NOTE — Patient Instructions (Signed)
Axillary Nerve Injury  The nerve supplies a muscle behind the shoulder and the muscle that covers the top of the shoulder (deltoid). The nerve is located in the neck, shoulder, and upper arm. Stretching of the axillary nerve or pressure on the nerve can cause shoulder weakness, nerve damage (neuropathy), and difficulty lifting the affected arm. Axillary nerve injuries are common among athletes who participate in contact sports or activities that put a lot of stress on the shoulder. These injuries usually get better in 4-6 months, but surgery may be needed in some cases. What are the causes? This condition may be caused by repeated small injuries over time or by a sudden (acute) injury. Acute injuries may result from:  A hard, direct hit (trauma) to the shoulder.  A broken upper arm or shoulder.  Dislocation of the shoulder. This condition may also be caused by surgery on the shoulder or long-term use of a crutch. What increases the risk? You are more likely to develop this condition if you:  Are a young male.  Participate in contact sports or activities that put stress on the shoulder. These activities include: ? Skiing. ? Football. ? Rugby. ? Baseball. ? Hockey. ? Wrestling. ? Weight lifting. What are the signs or symptoms? The main symptom of this condition is shoulder weakness. Other symptoms include:  Difficulty lifting the arm and turning it outward.  Numbness on the outside of the shoulder.  Shrinking of the deltoid muscle.  Shoulder pain.  Quick loss of strength (muscle fatigue) when using the shoulder. How is this diagnosed? This condition may be diagnosed based on:  Your symptoms.  Your medical history, especially any recent injuries you have had.  A physical exam. Your health care provider may test your muscle strength and check for shoulder numbness.  Tests, including: ? Electromyogram (EMG). A test to check the nerves that control  muscles. ? MRI. ? Ultrasound. ? Injection of a numbing medicine near your nerve to see if that relieves pain. How is this treated? This condition may be treated by:  Avoiding activities that cause pain.  Taking medicines that help to relieve pain.  Physical therapy.  Injections of medicines to numb the area and to help reduce swelling (steroids). These medicines can also help to relieve pain.  Surgery. This may be needed if your condition does not get better after 6 months of treatment. Follow these instructions at home: Activity  Return to your normal activities as told by your health care provider. Ask your health care provider what activities are safe for you.  Avoid activities that make your symptoms worse.  Do exercises as told by your health care provider. Managing pain, stiffness, and swelling      If directed, put ice on the injured area. ? Put ice in a plastic bag. ? Place a towel between your skin and the bag. ? Leave the ice on for 20 minutes, 2-3 times a day.  If directed, apply heat to the affected area before you exercise. Use the heat source that your health care provider recommends, such as a moist heat pack or a heating pad. ? Place a towel between your skin and the heat source. ? Leave the heat on for 20-30 minutes. ? Remove the heat if your skin turns bright red. This is especially important if you are unable to feel pain, heat, or cold. You may have a greater risk of getting burned. General instructions  Take over-the-counter and prescription medicines only as told  by your health care provider.  Do not use any products that contain nicotine or tobacco, such as cigarettes, e-cigarettes, and chewing tobacco. These can delay healing. If you need help quitting, ask your health care provider.  Ask your health care provider if the medicine prescribed to you can cause constipation. You may need to take steps to prevent or treat constipation, such as: ? Drink  enough fluid to keep your urine pale yellow. ? Take over-the-counter or prescription medicines. ? Eat foods that are high in fiber, such as beans, whole grains, and fresh fruits and vegetables. ? Limit foods that are high in fat and processed sugars, such as fried or sweet foods.  Keep all follow-up visits as told by your health care provider. This is important. Driving  Do not drive or use heavy machinery while taking prescription pain medicine.  Ask your health care provider when it is safe for you to drive. How is this prevented?  Warm up and stretch before being active.  Cool down and stretch after being active.  Give your body time to rest between periods of activity.  Make sure you use equipment that fits you.  Be safe and responsible while being active. This will help you avoid falls.  Do at least 150 minutes of moderate-intensity exercise each week, such as brisk walking or water aerobics.  Maintain physical fitness, including: ? Strength. ? Flexibility. ? Cardiovascular fitness. ? Endurance. Contact a health care provider if:  You still have symptoms after 4-6 months of treatment.  Your symptoms are getting worse. Summary  The axillary nerve supplies a muscle behind the shoulder and the muscle that covers the top of the shoulder.  Stretching of the axillary nerve or pressure on the nerve can cause shoulder weakness, nerve damage, and difficulty lifting the affected arm.  The main symptom of this condition is shoulder weakness.  This condition may be treated with rest, pain medicines, and physical therapy.  In some cases, steroid injections or surgery may be required. This information is not intended to replace advice given to you by your health care provider. Make sure you discuss any questions you have with your health care provider. Document Released: 05/29/2005 Document Revised: 09/17/2018 Document Reviewed: 11/29/2017 Elsevier Patient Education  2020  [employer].

## 2019-05-05 ENCOUNTER — Telehealth: Payer: Self-pay

## 2019-05-05 NOTE — Telephone Encounter (Signed)
Pt called in stating he wanted a "higher dose of prednisone." He is taking 10mg  daily plus the meloxicam for the pain- I offered a referral to ortho before Wednesday- he said, "No." I told him to call back if changes mind.

## 2019-05-13 ENCOUNTER — Encounter: Payer: Self-pay | Admitting: Family Medicine

## 2019-05-13 ENCOUNTER — Other Ambulatory Visit: Payer: Self-pay

## 2019-05-13 ENCOUNTER — Ambulatory Visit: Payer: BLUE CROSS/BLUE SHIELD | Admitting: Family Medicine

## 2019-05-13 VITALS — BP 128/70 | HR 60 | Ht 66.0 in | Wt 186.0 lb

## 2019-05-13 DIAGNOSIS — E119 Type 2 diabetes mellitus without complications: Secondary | ICD-10-CM

## 2019-05-13 DIAGNOSIS — M25511 Pain in right shoulder: Secondary | ICD-10-CM

## 2019-05-13 DIAGNOSIS — S4431XA Injury of axillary nerve, right arm, initial encounter: Secondary | ICD-10-CM | POA: Diagnosis not present

## 2019-05-13 MED ORDER — JARDIANCE 10 MG PO TABS: ORAL_TABLET | ORAL | 5 refills | Status: DC

## 2019-05-13 MED ORDER — METFORMIN HCL 500 MG PO TABS: 500.0000 mg | ORAL_TABLET | Freq: Two times a day (BID) | ORAL | 1 refills | Status: DC

## 2019-05-13 MED ORDER — INSULIN GLARGINE 100 UNIT/ML SOLOSTAR PEN: 34.0000 [IU] | PEN_INJECTOR | Freq: Every day | SUBCUTANEOUS | 1 refills | Status: DC

## 2019-05-13 NOTE — Progress Notes (Signed)
Date:  05/13/2019   Name:  Kevin Montgomery   DOB:  Jul 12, 1965   MRN:  [id number]   Chief Complaint: Diabetes (repeat A1C) and Shoulder Pain  Diabetes He presents for his follow-up diabetic visit. He has type 2 diabetes mellitus. His disease course has been stable. There are no hypoglycemic associated symptoms. Pertinent negatives for hypoglycemia include no dizziness, headaches or nervousness/anxiousness. There are no diabetic associated symptoms. Pertinent negatives for diabetes include no chest pain and no polydipsia. There are no hypoglycemic complications. Symptoms are stable. There are no diabetic complications. Risk factors for coronary artery disease include diabetes mellitus. Current diabetic treatment includes oral agent (dual therapy) and insulin injections. He is compliant with treatment most of the time. His weight is stable. He is following a generally healthy diet. Meal planning includes avoidance of concentrated sweets and carbohydrate counting. He participates in exercise daily. His breakfast blood glucose is taken between 8-9 am. His breakfast blood glucose range is generally 140-180 mg/dl. An ACE inhibitor/angiotensin II receptor blocker is being taken.  Shoulder Pain  The pain is present in the right shoulder. This is a chronic problem. The current episode started 1 to 4 weeks ago. The problem occurs constantly. The problem has been gradually improving. The quality of the pain is described as aching. The pain is moderate. Pertinent negatives include no fever, inability to bear weight, itching, joint locking, joint swelling, limited range of motion, numbness, stiffness or tingling. The treatment provided moderate relief.    Lab Results  Component Value Date   CREATININE 0.81 01/24/2019   BUN 13 01/24/2019   NA 135 01/24/2019   K 3.6 01/24/2019   CL 101 01/24/2019   CO2 22 01/24/2019   Lab Results  Component Value Date   CHOL 184 03/12/2019   HDL 35 (L) 03/12/2019   LDLCALC 112 (H) 03/12/2019   TRIG 211 (H) 03/12/2019   CHOLHDL 3.8 11/08/2018   No results found for: TSH Lab Results  Component Value Date   HGBA1C 7.7 (H) 03/12/2019     Review of Systems  Constitutional: Negative for chills and fever.  HENT: Negative for drooling, ear discharge, ear pain and sore throat.   Respiratory: Negative for cough, shortness of breath and wheezing.   Cardiovascular: Negative for chest pain, palpitations and leg swelling.  Gastrointestinal: Negative for abdominal pain, blood in stool, constipation, diarrhea and nausea.  Endocrine: Negative for polydipsia.  Genitourinary: Negative for dysuria, frequency, hematuria and urgency.  Musculoskeletal: Negative for back pain, myalgias, neck pain and stiffness.  Skin: Negative for itching and rash.  Allergic/Immunologic: Negative for environmental allergies.  Neurological: Negative for dizziness, tingling, numbness and headaches.  Hematological: Does not bruise/bleed easily.  Psychiatric/Behavioral: Negative for suicidal ideas. The patient is not nervous/anxious.     Patient Active Problem List   Diagnosis Date Noted  . Encounter for screening colonoscopy   . Gastroesophageal reflux disease 12/05/2017  . Incarcerated umbilical hernia   . Umbilical hernia [id number]  . Diastasis recti 10/08/2015  . Essential hypertension 04/21/2015  . Hyperlipidemia 04/21/2015    No Known Allergies  Past Surgical History:  Procedure Laterality Date  . COLONOSCOPY WITH PROPOFOL N/A 12/16/2018   Procedure: COLONOSCOPY WITH PROPOFOL;  Surgeon: Lucilla Lame, MD;  Location: [place];  Service: Endoscopy;  Laterality: N/A;  Diabetic - insulin and oral meds  . KIDNEY STONE SURGERY    . LITHOTRIPSY    . UMBILICAL HERNIA REPAIR N/A 12/20/2015   Procedure: HERNIA  REPAIR UMBILICAL ADULT;  Surgeon: Hubbard Robinson, MD;  Location: [facility];  Service: General;  Laterality: N/A;    Social History   Tobacco Use  .  Smoking status: Never Smoker  . Smokeless tobacco: Never Used  Substance Use Topics  . Alcohol use: Yes    Alcohol/week: 0.0 standard drinks    Comment: rare  . Drug use: No     Medication list has been reviewed and updated.  Current Meds  Medication Sig  . aspirin 81 MG tablet Take 1 tablet (81 mg total) by mouth daily.  . empagliflozin (JARDIANCE) 10 MG TABS tablet TAKE (1) TABLET BY MOUTH EVERY DAY  . Insulin Glargine (LANTUS) 100 UNIT/ML Solostar Pen Inject 34 Units into the skin daily.  Marland Kitchen lisinopril (ZESTRIL) 5 MG tablet TAKE ONE TABLET BY MOUTH EVERY MORNING  . meloxicam (MOBIC) 15 MG tablet Take 1 tablet (15 mg total) by mouth daily.  . metFORMIN (GLUCOPHAGE) 500 MG tablet Take 1 tablet (500 mg total) by mouth 2 (two) times daily.  . metoprolol succinate (TOPROL-XL) 25 MG 24 hr tablet Take 1 tablet (25 mg total) by mouth daily.  . pantoprazole (PROTONIX) 40 MG tablet TAKE (1) TABLET BY MOUTH EVERY DAY  . predniSONE (DELTASONE) 10 MG tablet Take 1 tablet (10 mg total) by mouth daily with breakfast.  . tamsulosin (FLOMAX) 0.4 MG CAPS capsule TAKE (1) CAPSULE BY MOUTH EVERY DAY  . traZODone (DESYREL) 50 MG tablet Take 0.5-1 tablets (25-50 mg total) by mouth at bedtime as needed for sleep.    PHQ 2/9 Scores 05/13/2019 11/08/2018 07/18/2018 01/30/2017  PHQ - 2 Score 0 0 0 0  PHQ- 9 Score 0 0 0 0    BP Readings from Last 3 Encounters:  05/13/19 128/70  05/01/19 130/78  03/12/19 (!) 134/100    Physical Exam Vitals signs and nursing note reviewed.  HENT:     Head: Normocephalic.     Right Ear: Tympanic membrane, ear canal and external ear normal.     Left Ear: Tympanic membrane, ear canal and external ear normal.     Nose: Nose normal. No congestion or rhinorrhea.     Mouth/Throat:     Mouth: Mucous membranes are moist.  Eyes:     General: No scleral icterus.       Right eye: No discharge.        Left eye: No discharge.     Conjunctiva/sclera: Conjunctivae normal.      Pupils: Pupils are equal, round, and reactive to light.  Neck:     Musculoskeletal: Normal range of motion and neck supple.     Thyroid: No thyromegaly.     Vascular: No JVD.     Trachea: No tracheal deviation.  Cardiovascular:     Rate and Rhythm: Normal rate and regular rhythm.     Heart sounds: Normal heart sounds. No murmur. No friction rub. No gallop.   Pulmonary:     Effort: No respiratory distress.     Breath sounds: Normal breath sounds. No wheezing, rhonchi or rales.  Abdominal:     General: Bowel sounds are normal. There is no distension.     Palpations: Abdomen is soft. There is no mass.     Tenderness: There is no abdominal tenderness. There is no guarding or rebound.  Musculoskeletal: Normal range of motion.        General: No tenderness.  Lymphadenopathy:     Cervical: No cervical adenopathy.  Skin:  General: Skin is warm.     Capillary Refill: Capillary refill takes less than 2 seconds.     Findings: No rash.  Neurological:     Mental Status: He is alert and oriented to person, place, and time.     Cranial Nerves: No cranial nerve deficit.     Deep Tendon Reflexes: Reflexes are normal and symmetric.     Wt Readings from Last 3 Encounters:  05/13/19 186 lb (84.4 kg)  05/01/19 187 lb (84.8 kg)  03/12/19 186 lb (84.4 kg)    BP 128/70   Pulse 60   Ht 5\' 6"  (1.676 m)   Wt 186 lb (84.4 kg)   BMI 30.02 kg/m   Assessment and Plan: 1. Type 2 diabetes mellitus without complication, without long-term current use of insulin (HCC) Chronic.  Relatively controlled.  Stable.  Will check A1c and renal function panel we will continue Metformin 500 mg twice a day Lantus 34 units nightly and Jardiance 10 mg once a day.  Will recheck in 6 months - Hemoglobin A1c - Renal Function Panel - metFORMIN (GLUCOPHAGE) 500 MG tablet; Take 1 tablet (500 mg total) by mouth 2 (two) times daily.  Dispense: 180 tablet; Refill: 1 - Insulin Glargine (LANTUS) 100 UNIT/ML Solostar Pen;  Inject 34 Units into the skin daily.  Dispense: 5 pen; Refill: 1 - empagliflozin (JARDIANCE) 10 MG TABS tablet; TAKE (1) TABLET BY MOUTH EVERY DAY  Dispense: 30 tablet; Refill: 5  2. Injury of right axillary nerve, initial encounter Patient still having difficulty with his right shoulder and neuropathy going down the distribution of the ulnar nerve on the right arm.  This occurred after his [place] retriever aggressively pulled on him sustaining a shoulder injury I think this may be a brachial plexus axilloaxillary issue and patient is willing now to have evaluation per orthopedics with [facility].  3. Acute pain of right shoulder Patient continues to have pain in the posterior aspect of his right shoulder which may reflect her rotator cuff tear due to the trauma of the large dog pulling on his leash.  Will refer to neuro clinic orthopedics for evaluation. - Ambulatory referral to Orthopedic Surgery

## 2019-05-14 ENCOUNTER — Other Ambulatory Visit: Payer: Self-pay

## 2019-05-14 DIAGNOSIS — E119 Type 2 diabetes mellitus without complications: Secondary | ICD-10-CM

## 2019-05-14 LAB — HEMOGLOBIN A1C
Est. average glucose Bld gHb Est-mCnc: 174 mg/dL
Hgb A1c MFr Bld: 7.7 % — ABNORMAL HIGH (ref 4.8–5.6)

## 2019-05-14 LAB — RENAL FUNCTION PANEL
Albumin: 5 g/dL — ABNORMAL HIGH (ref 3.8–4.9)
BUN/Creatinine Ratio: 17 (ref 9–20)
BUN: 19 mg/dL (ref 6–24)
CO2: 23 mmol/L (ref 20–29)
Calcium: 9.9 mg/dL (ref 8.7–10.2)
Chloride: 102 mmol/L (ref 96–106)
Creatinine, Ser: 1.12 mg/dL (ref 0.76–1.27)
GFR calc Af Amer: 86 mL/min/{1.73_m2} (ref >59)
GFR calc non Af Amer: 75 mL/min/{1.73_m2} (ref >59)
Glucose: 195 mg/dL — ABNORMAL HIGH (ref 65–99)
Phosphorus: 4 mg/dL (ref 2.8–4.1)
Potassium: 4.2 mmol/L (ref 3.5–5.2)
Sodium: 141 mmol/L (ref 134–144)

## 2019-05-14 MED ORDER — INSULIN GLARGINE 100 UNIT/ML SOLOSTAR PEN: 38.0000 [IU] | PEN_INJECTOR | Freq: Every day | SUBCUTANEOUS | 1 refills | Status: DC

## 2019-06-18 ENCOUNTER — Other Ambulatory Visit: Payer: Self-pay | Admitting: Orthopedic Surgery

## 2019-06-18 DIAGNOSIS — M5412 Radiculopathy, cervical region: Secondary | ICD-10-CM

## 2019-06-18 DIAGNOSIS — M5030 Other cervical disc degeneration, unspecified cervical region: Secondary | ICD-10-CM

## 2019-06-25 ENCOUNTER — Other Ambulatory Visit: Payer: Self-pay

## 2019-06-25 ENCOUNTER — Ambulatory Visit
Admission: RE | Admit: 2019-06-25 | Discharge: 2019-06-25 | Disposition: A | Discharge status: Home / Self Care | Payer: 59 | Source: Ambulatory Visit | Attending: Orthopedic Surgery | Admitting: Orthopedic Surgery

## 2019-06-25 DIAGNOSIS — M5412 Radiculopathy, cervical region: Secondary | ICD-10-CM | POA: Diagnosis not present

## 2019-06-25 DIAGNOSIS — M5030 Other cervical disc degeneration, unspecified cervical region: Secondary | ICD-10-CM | POA: Insufficient documentation

## 2019-07-24 ENCOUNTER — Other Ambulatory Visit: Payer: Self-pay | Admitting: Family Medicine

## 2019-07-24 DIAGNOSIS — K219 Gastro-esophageal reflux disease without esophagitis: Secondary | ICD-10-CM

## 2019-09-20 ENCOUNTER — Other Ambulatory Visit: Payer: Self-pay | Admitting: Family Medicine

## 2019-09-20 DIAGNOSIS — I10 Essential (primary) hypertension: Secondary | ICD-10-CM

## 2019-09-25 ENCOUNTER — Other Ambulatory Visit: Payer: Self-pay

## 2019-09-25 ENCOUNTER — Ambulatory Visit (INDEPENDENT_AMBULATORY_CARE_PROVIDER_SITE_OTHER): Payer: 59 | Admitting: Family Medicine

## 2019-09-25 ENCOUNTER — Encounter: Payer: Self-pay | Admitting: Family Medicine

## 2019-09-25 VITALS — BP 120/70 | HR 76 | Ht 66.0 in | Wt 187.0 lb

## 2019-09-25 DIAGNOSIS — I10 Essential (primary) hypertension: Secondary | ICD-10-CM | POA: Diagnosis not present

## 2019-09-25 DIAGNOSIS — E782 Mixed hyperlipidemia: Secondary | ICD-10-CM | POA: Diagnosis not present

## 2019-09-25 DIAGNOSIS — R350 Frequency of micturition: Secondary | ICD-10-CM

## 2019-09-25 DIAGNOSIS — N401 Enlarged prostate with lower urinary tract symptoms: Secondary | ICD-10-CM | POA: Diagnosis not present

## 2019-09-25 DIAGNOSIS — K219 Gastro-esophageal reflux disease without esophagitis: Secondary | ICD-10-CM

## 2019-09-25 DIAGNOSIS — E119 Type 2 diabetes mellitus without complications: Secondary | ICD-10-CM

## 2019-09-25 MED ORDER — LISINOPRIL 5 MG PO TABS: ORAL_TABLET | ORAL | 1 refills | Status: DC

## 2019-09-25 MED ORDER — JARDIANCE 10 MG PO TABS: ORAL_TABLET | ORAL | 5 refills | Status: DC

## 2019-09-25 MED ORDER — INSULIN GLARGINE 100 UNIT/ML SOLOSTAR PEN: 40.0000 [IU] | PEN_INJECTOR | Freq: Every day | SUBCUTANEOUS | 1 refills | Status: DC

## 2019-09-25 MED ORDER — METFORMIN HCL 500 MG PO TABS: 500.0000 mg | ORAL_TABLET | Freq: Two times a day (BID) | ORAL | 1 refills | Status: DC

## 2019-09-25 MED ORDER — TAMSULOSIN HCL 0.4 MG PO CAPS: ORAL_CAPSULE | ORAL | 1 refills | Status: DC

## 2019-09-25 MED ORDER — PANTOPRAZOLE SODIUM 40 MG PO TBEC: DELAYED_RELEASE_TABLET | ORAL | 1 refills | Status: DC

## 2019-09-25 NOTE — Patient Instructions (Signed)

## 2019-09-25 NOTE — Progress Notes (Signed)
Date:  09/25/2019   Name:  Kevin Montgomery   DOB:  Feb 10, 1966   MRN:  [id number]   Chief Complaint: Diabetes (foot exam ), Gastroesophageal Reflux, and Benign Prostatic Hypertrophy  Diabetes He presents for his follow-up diabetic visit. He has type 2 diabetes mellitus. His disease course has been stable. There are no hypoglycemic associated symptoms. Pertinent negatives for hypoglycemia include no dizziness, headaches, nervousness/anxiousness or sweats. Pertinent negatives for diabetes include no blurred vision, no chest pain, no fatigue, no foot paresthesias, no foot ulcerations, no polydipsia, no polyphagia, no polyuria, no visual change, no weakness and no weight loss. There are no hypoglycemic complications. Symptoms are worsening. There are no diabetic complications. Pertinent negatives for diabetic complications include no CVA, PVD or retinopathy. Risk factors for coronary artery disease include dyslipidemia and hypertension. Current diabetic treatment includes insulin injections and oral agent (dual therapy). He is compliant with treatment most of the time. His weight is decreasing steadily. He is following a generally healthy diet. Meal planning includes avoidance of concentrated sweets and carbohydrate counting. He participates in exercise intermittently. His breakfast blood glucose is taken between 8-9 am. His breakfast blood glucose range is generally 140-180 mg/dl. An ACE inhibitor/angiotensin II receptor blocker is being taken. He does not see a podiatrist.Eye exam is not current.  Gastroesophageal Reflux He reports no abdominal pain, no belching, no chest pain, no choking, no coughing, no dysphagia, no early satiety, no globus sensation, no heartburn, no hoarse voice, no nausea, no sore throat, no stridor, no water brash or no wheezing. This is a chronic problem. The current episode started more than 1 year ago. The problem has been gradually improving. Pertinent negatives include no  fatigue or weight loss. The treatment provided moderate relief.  Benign Prostatic Hypertrophy This is a chronic problem. The current episode started more than 1 year ago. The problem has been gradually improving since onset. Irritative symptoms include nocturia. Irritative symptoms do not include frequency or urgency. Obstructive symptoms do not include dribbling, incomplete emptying, an intermittent stream, a slower stream, straining or a weak stream. Pertinent negatives include no chills, dysuria, genital pain, hematuria, hesitancy, nausea or vomiting. Nothing aggravates the symptoms. Past treatments include tamsulosin. The treatment provided mild relief.  Hypertension This is a chronic problem. The current episode started more than 1 year ago. The problem has been gradually improving since onset. The problem is controlled. Pertinent negatives include no anxiety, blurred vision, chest pain, headaches, malaise/fatigue, neck pain, orthopnea, palpitations, peripheral edema, PND, shortness of breath or sweats. There are no associated agents to hypertension. Risk factors for coronary artery disease include dyslipidemia. Past treatments include ACE inhibitors. There is no history of angina, kidney disease, CAD/MI, CVA, heart failure, left ventricular hypertrophy, PVD or retinopathy. There is no history of chronic renal disease, a hypertension causing med or renovascular disease.  Hyperlipidemia This is a chronic problem. The current episode started more than 1 year ago. The problem is controlled. Recent lipid tests were reviewed and are normal. Exacerbating diseases include diabetes and obesity. He has no history of chronic renal disease. Associated symptoms include a focal sensory loss. Pertinent negatives include no chest pain, focal weakness, leg pain, myalgias or shortness of breath. Current antihyperlipidemic treatment includes diet change. The current treatment provides moderate improvement of lipids. There  are no compliance problems.  Risk factors for coronary artery disease include hypertension.    Lab Results  Component Value Date   CREATININE 1.12 05/13/2019   BUN  19 05/13/2019   NA 141 05/13/2019   K 4.2 05/13/2019   CL 102 05/13/2019   CO2 23 05/13/2019   Lab Results  Component Value Date   CHOL 184 03/12/2019   HDL 35 (L) 03/12/2019   LDLCALC 112 (H) 03/12/2019   TRIG 211 (H) 03/12/2019   CHOLHDL 3.8 11/08/2018   No results found for: TSH Lab Results  Component Value Date   HGBA1C 7.7 (H) 05/13/2019   Lab Results  Component Value Date   WBC 6.8 01/24/2019   HGB 16.4 01/24/2019   HCT 45.0 01/24/2019   MCV 90.2 01/24/2019   PLT 189 01/24/2019   Lab Results  Component Value Date   ALT 50 (H) 01/24/2019   AST 29 01/24/2019   ALKPHOS 49 01/24/2019   BILITOT 1.7 (H) 01/24/2019     Review of Systems  Constitutional: Negative for chills, fatigue, fever, malaise/fatigue and weight loss.  HENT: Negative for drooling, ear discharge, ear pain, hoarse voice, postnasal drip, rhinorrhea and sore throat.   Eyes: Negative for blurred vision.  Respiratory: Negative for cough, choking, shortness of breath and wheezing.   Cardiovascular: Negative for chest pain, palpitations, orthopnea, leg swelling and PND.  Gastrointestinal: Negative for abdominal pain, blood in stool, constipation, diarrhea, dysphagia, heartburn, nausea and vomiting.  Endocrine: Negative for polydipsia, polyphagia and polyuria.  Genitourinary: Positive for nocturia. Negative for dysuria, frequency, hematuria, hesitancy, incomplete emptying and urgency.  Musculoskeletal: Negative for back pain, myalgias and neck pain.  Skin: Negative for rash.  Allergic/Immunologic: Negative for environmental allergies.  Neurological: Negative for dizziness, focal weakness, weakness and headaches.  Hematological: Does not bruise/bleed easily.  Psychiatric/Behavioral: Negative for suicidal ideas. The patient is not  nervous/anxious.     Patient Active Problem List   Diagnosis Date Noted  . Encounter for screening colonoscopy   . Gastroesophageal reflux disease 12/05/2017  . Incarcerated umbilical hernia   . Umbilical hernia [id number]  . Diastasis recti 10/08/2015  . Essential hypertension 04/21/2015  . Hyperlipidemia 04/21/2015    No Known Allergies  Past Surgical History:  Procedure Laterality Date  . COLONOSCOPY WITH PROPOFOL N/A 12/16/2018   Procedure: COLONOSCOPY WITH PROPOFOL;  Surgeon: Lucilla Lame, MD;  Location: [facility];  Service: Endoscopy;  Laterality: N/A;  Diabetic - insulin and oral meds  . KIDNEY STONE SURGERY    . LITHOTRIPSY    . UMBILICAL HERNIA REPAIR N/A 12/20/2015   Procedure: HERNIA REPAIR UMBILICAL ADULT;  Surgeon: Hubbard Robinson, MD;  Location: [facility];  Service: General;  Laterality: N/A;    Social History   Tobacco Use  . Smoking status: Never Smoker  . Smokeless tobacco: Never Used  Substance Use Topics  . Alcohol use: Yes    Alcohol/week: 0.0 standard drinks    Comment: rare  . Drug use: No     Medication list has been reviewed and updated.  Current Meds  Medication Sig  . aspirin 81 MG tablet Take 1 tablet (81 mg total) by mouth daily.  . empagliflozin (JARDIANCE) 10 MG TABS tablet TAKE (1) TABLET BY MOUTH EVERY DAY  . gabapentin (NEURONTIN) 300 MG capsule Take 300 mg by mouth 3 (three) times daily. One in AM two in PM - [place]  . Insulin Glargine (LANTUS) 100 UNIT/ML Solostar Pen Inject 38 Units into the skin daily.  Marland Kitchen lisinopril (ZESTRIL) 5 MG tablet TAKE (1) TABLET BY MOUTH EVERY MORNING  . metFORMIN (GLUCOPHAGE) 500 MG tablet Take 1 tablet (500 mg total) by mouth  2 (two) times daily.  . pantoprazole (PROTONIX) 40 MG tablet TAKE (1) TABLET BY MOUTH EVERY DAY  . tamsulosin (FLOMAX) 0.4 MG CAPS capsule TAKE (1) CAPSULE BY MOUTH EVERY DAY    PHQ 2/9 Scores 09/25/2019 05/13/2019 11/08/2018 07/18/2018  PHQ - 2 Score 0 0 0 0  PHQ- 9 Score 3  0 0 0    BP Readings from Last 3 Encounters:  09/25/19 120/70  05/13/19 128/70  05/01/19 130/78    Physical Exam Vitals and nursing note reviewed.  HENT:     Head: Normocephalic.     Right Ear: Tympanic membrane, ear canal and external ear normal. There is no impacted cerumen.     Left Ear: Tympanic membrane, ear canal and external ear normal. There is no impacted cerumen.     Nose: Nose normal. No congestion or rhinorrhea.     Mouth/Throat:     Mouth: Mucous membranes are moist.     Pharynx: Oropharynx is clear.  Eyes:     General: No scleral icterus.       Right eye: No discharge.        Left eye: No discharge.     Conjunctiva/sclera: Conjunctivae normal.     Pupils: Pupils are equal, round, and reactive to light.  Neck:     Thyroid: No thyromegaly.     Vascular: No JVD.     Trachea: No tracheal deviation.  Cardiovascular:     Rate and Rhythm: Normal rate and regular rhythm.     Heart sounds: Normal heart sounds. No murmur. No friction rub. No gallop.   Pulmonary:     Effort: No respiratory distress.     Breath sounds: Normal breath sounds. No stridor. No wheezing, rhonchi or rales.  Chest:     Chest wall: No tenderness.  Abdominal:     General: Bowel sounds are normal. There is no distension.     Palpations: Abdomen is soft. There is no mass.     Tenderness: There is no abdominal tenderness. There is no guarding or rebound.     Hernia: No hernia is present.  Musculoskeletal:        General: No tenderness. Normal range of motion.     Cervical back: Normal range of motion and neck supple.  Feet:     Right foot:     Protective Sensation: 10 sites tested. 10 sites sensed.     Skin integrity: Skin integrity normal.     Toenail Condition: Right toenails are normal.     Left foot:     Protective Sensation: 10 sites tested. 10 sites sensed.     Skin integrity: Skin integrity normal.     Toenail Condition: Left toenails are normal.  Lymphadenopathy:     Cervical: No  cervical adenopathy.  Skin:    General: Skin is warm.     Capillary Refill: Capillary refill takes less than 2 seconds.     Findings: No rash.  Neurological:     Mental Status: He is alert and oriented to person, place, and time.     Cranial Nerves: No cranial nerve deficit.     Deep Tendon Reflexes: Reflexes are normal and symmetric.     Wt Readings from Last 3 Encounters:  09/25/19 187 lb (84.8 kg)  05/13/19 186 lb (84.4 kg)  05/01/19 187 lb (84.8 kg)    BP 120/70   Pulse 76   Ht 5\' 6"  (1.676 m)   Wt 187 lb (84.8 kg)  BMI 30.18 kg/m   Assessment and Plan:  1. Type 2 diabetes mellitus without complication, without long-term current use of insulin (HCC) Chronic.  Uncontrolled.  Relatively stable.  Patient is because of injections of prednisone and off-and-on medication use probably is been in higher range than what he should be as far as fasting blood sugars.  We will check an A1c but will anticipate that likely given the prednisone injections that he is going to need to be increased to 40 units on the Lantus we will also continue Jardiance 10 mg take by mouth once a day as well as Metformin 500 mg that he should take twice a day but he has been taking once a day. - empagliflozin (JARDIANCE) 10 MG TABS tablet; TAKE (1) TABLET BY MOUTH EVERY DAY  Dispense: 30 tablet; Refill: 5 - insulin glargine (LANTUS) 100 UNIT/ML Solostar Pen; Inject 40 Units into the skin at bedtime.  Dispense: 5 pen; Refill: 1 - metFORMIN (GLUCOPHAGE) 500 MG tablet; Take 1 tablet (500 mg total) by mouth 2 (two) times daily.  Dispense: 180 tablet; Refill: 1 - Hemoglobin A1c - Lipid Panel With LDL/HDL Ratio - Renal Function Panel  2. Essential hypertension Chronic.  Controlled.  Stable.  Blood pressure is doing well on current dosing but for diabetic nephropathy concerns we will continue lisinopril 5 mg daily. - lisinopril (ZESTRIL) 5 MG tablet; TAKE (1) TABLET BY MOUTH EVERY MORNING  Dispense: 90 tablet;  Refill: 1 - Renal Function Panel  3. Mixed hyperlipidemia Chronic.  Controlled.  Stable.  Patient has elevations of both triglycerides and LDL.  Will check lipid panel and consider having to go to statin if elevation is noted. - Lipid Panel With LDL/HDL Ratio  4. Benign prostatic hyperplasia with urinary frequency Chronic.  Controlled.  Stable.  Symptom wise only having some nocturia with out decreased stream or hesitancy.  Will continue tamsulosin 0.4 mg daily. - tamsulosin (FLOMAX) 0.4 MG CAPS capsule; TAKE (1) CAPSULE BY MOUTH EVERY DAY  Dispense: 90 capsule; Refill: 1  5. Gastroesophageal reflux disease Chronic.  Controlled.  Stable.  Continue pantoprazole 40 mg once a day. - pantoprazole (PROTONIX) 40 MG tablet; One a day  Dispense: 90 tablet; Refill: 1

## 2019-09-26 LAB — RENAL FUNCTION PANEL
Albumin: 4.9 g/dL (ref 3.8–4.9)
BUN/Creatinine Ratio: 17 (ref 9–20)
BUN: 13 mg/dL (ref 6–24)
CO2: 22 mmol/L (ref 20–29)
Calcium: 9.8 mg/dL (ref 8.7–10.2)
Chloride: 101 mmol/L (ref 96–106)
Creatinine, Ser: 0.75 mg/dL — ABNORMAL LOW (ref 0.76–1.27)
GFR calc Af Amer: 121 mL/min/{1.73_m2} (ref >59)
GFR calc non Af Amer: 105 mL/min/{1.73_m2} (ref >59)
Glucose: 100 mg/dL — ABNORMAL HIGH (ref 65–99)
Phosphorus: 3.9 mg/dL (ref 2.8–4.1)
Potassium: 3.6 mmol/L (ref 3.5–5.2)
Sodium: 139 mmol/L (ref 134–144)

## 2019-09-26 LAB — LIPID PANEL WITH LDL/HDL RATIO
Cholesterol, Total: 176 mg/dL (ref 100–199)
HDL: 41 mg/dL (ref >39)
LDL Chol Calc (NIH): 104 mg/dL — ABNORMAL HIGH (ref 0–99)
LDL/HDL Ratio: 2.5 ratio (ref 0.0–3.6)
Triglycerides: 180 mg/dL — ABNORMAL HIGH (ref 0–149)
VLDL Cholesterol Cal: 31 mg/dL (ref 5–40)

## 2019-09-26 LAB — HEMOGLOBIN A1C
Est. average glucose Bld gHb Est-mCnc: 174 mg/dL
Hgb A1c MFr Bld: 7.7 % — ABNORMAL HIGH (ref 4.8–5.6)

## 2019-09-29 ENCOUNTER — Other Ambulatory Visit: Payer: Self-pay

## 2019-09-29 DIAGNOSIS — E119 Type 2 diabetes mellitus without complications: Secondary | ICD-10-CM

## 2019-09-29 MED ORDER — INSULIN GLARGINE 100 UNIT/ML SOLOSTAR PEN: 40.0000 [IU] | PEN_INJECTOR | Freq: Every day | SUBCUTANEOUS | 1 refills | Status: DC

## 2019-10-29 ENCOUNTER — Other Ambulatory Visit: Payer: Self-pay | Admitting: Family Medicine

## 2019-10-29 DIAGNOSIS — E119 Type 2 diabetes mellitus without complications: Secondary | ICD-10-CM

## 2019-12-27 ENCOUNTER — Other Ambulatory Visit: Payer: Self-pay | Admitting: Family Medicine

## 2019-12-27 NOTE — Telephone Encounter (Signed)
Do not see on active med list

## 2020-01-05 ENCOUNTER — Other Ambulatory Visit: Payer: Self-pay | Admitting: Family Medicine

## 2020-01-05 DIAGNOSIS — E119 Type 2 diabetes mellitus without complications: Secondary | ICD-10-CM

## 2020-01-27 ENCOUNTER — Encounter: Payer: Self-pay | Admitting: Family Medicine

## 2020-01-27 ENCOUNTER — Ambulatory Visit (INDEPENDENT_AMBULATORY_CARE_PROVIDER_SITE_OTHER): Payer: 59 | Admitting: Family Medicine

## 2020-01-27 ENCOUNTER — Other Ambulatory Visit
Admission: RE | Admit: 2020-01-27 | Discharge: 2020-01-27 | Disposition: A | Discharge status: Home / Self Care | Payer: 59 | Attending: Family Medicine | Admitting: Family Medicine

## 2020-01-27 ENCOUNTER — Other Ambulatory Visit: Payer: Self-pay

## 2020-01-27 VITALS — BP 134/88 | HR 77 | Ht 66.0 in | Wt 183.0 lb

## 2020-01-27 DIAGNOSIS — N401 Enlarged prostate with lower urinary tract symptoms: Secondary | ICD-10-CM | POA: Diagnosis not present

## 2020-01-27 DIAGNOSIS — D2272 Melanocytic nevi of left lower limb, including hip: Secondary | ICD-10-CM

## 2020-01-27 DIAGNOSIS — I10 Essential (primary) hypertension: Secondary | ICD-10-CM | POA: Insufficient documentation

## 2020-01-27 DIAGNOSIS — K219 Gastro-esophageal reflux disease without esophagitis: Secondary | ICD-10-CM

## 2020-01-27 DIAGNOSIS — E782 Mixed hyperlipidemia: Secondary | ICD-10-CM

## 2020-01-27 DIAGNOSIS — R350 Frequency of micturition: Secondary | ICD-10-CM

## 2020-01-27 DIAGNOSIS — E119 Type 2 diabetes mellitus without complications: Secondary | ICD-10-CM

## 2020-01-27 LAB — RENAL FUNCTION PANEL
Albumin: 4.7 g/dL (ref 3.5–5.0)
Anion gap: 9 (ref 5–15)
BUN: 16 mg/dL (ref 6–20)
CO2: 25 mmol/L (ref 22–32)
Calcium: 9.2 mg/dL (ref 8.9–10.3)
Chloride: 102 mmol/L (ref 98–111)
Creatinine, Ser: 0.8 mg/dL (ref 0.61–1.24)
GFR calc Af Amer: >60 mL/min (ref >60)
GFR calc non Af Amer: >60 mL/min (ref >60)
Glucose, Bld: 120 mg/dL — ABNORMAL HIGH (ref 70–99)
Phosphorus: 3.9 mg/dL (ref 2.5–4.6)
Potassium: 4 mmol/L (ref 3.5–5.1)
Sodium: 136 mmol/L (ref 135–145)

## 2020-01-27 LAB — LIPID PANEL
Cholesterol: 179 mg/dL (ref 0–200)
HDL: 44 mg/dL (ref >40)
LDL Cholesterol: 108 mg/dL — ABNORMAL HIGH (ref 0–99)
Total CHOL/HDL Ratio: 4.1 RATIO
Triglycerides: 134 mg/dL (ref <150)
VLDL: 27 mg/dL (ref 0–40)

## 2020-01-27 LAB — HEMOGLOBIN A1C
Hgb A1c MFr Bld: 6.9 % — ABNORMAL HIGH (ref 4.8–5.6)
Mean Plasma Glucose: 151.33 mg/dL

## 2020-01-27 MED ORDER — TAMSULOSIN HCL 0.4 MG PO CAPS: ORAL_CAPSULE | ORAL | 1 refills | Status: DC

## 2020-01-27 MED ORDER — PANTOPRAZOLE SODIUM 40 MG PO TBEC: DELAYED_RELEASE_TABLET | ORAL | 1 refills | Status: DC

## 2020-01-27 MED ORDER — LISINOPRIL 5 MG PO TABS: ORAL_TABLET | ORAL | 1 refills | Status: DC

## 2020-01-27 MED ORDER — METFORMIN HCL 500 MG PO TABS: 500.0000 mg | ORAL_TABLET | Freq: Two times a day (BID) | ORAL | 1 refills | Status: DC

## 2020-01-27 MED ORDER — EMPAGLIFLOZIN 10 MG PO TABS: ORAL_TABLET | ORAL | 5 refills | Status: DC

## 2020-01-27 MED ORDER — LANTUS SOLOSTAR 100 UNIT/ML ~~LOC~~ SOPN: PEN_INJECTOR | SUBCUTANEOUS | 1 refills | Status: DC

## 2020-01-27 NOTE — Progress Notes (Signed)
Date:  01/27/2020   Name:  Kevin Montgomery   DOB:  May 01, 1966   MRN:  [id number]   Chief Complaint: Diabetes (4 month follow up. Med refills. Foot exam.), Gastroesophageal Reflux, and Hypertension  Diabetes He presents for his follow-up diabetic visit. He has type 2 diabetes mellitus. His disease course has been stable. There are no hypoglycemic associated symptoms. Pertinent negatives for hypoglycemia include no confusion, dizziness, headaches, hunger, mood changes, nervousness/anxiousness, pallor, seizures, sleepiness, speech difficulty, sweats or tremors. There are no diabetic associated symptoms. Pertinent negatives for diabetes include no blurred vision, no chest pain, no fatigue, no foot paresthesias, no foot ulcerations, no polydipsia, no polyphagia, no polyuria, no visual change, no weakness and no weight loss. There are no hypoglycemic complications. Symptoms are stable. There are no diabetic complications. Risk factors for coronary artery disease include hypertension. Current diabetic treatment includes insulin injections and oral agent (dual therapy). He is compliant with treatment all of the time. His weight is stable. He is following a generally healthy diet. Meal planning includes avoidance of concentrated sweets and carbohydrate counting. He participates in exercise intermittently. His home blood glucose trend is fluctuating minimally. His breakfast blood glucose is taken between 8-9 am. His breakfast blood glucose range is generally 110-130 mg/dl. An ACE inhibitor/angiotensin II receptor blocker is being taken. Eye exam is current.  Gastroesophageal Reflux He reports no abdominal pain, no belching, no chest pain, no choking, no coughing, no dysphagia, no early satiety, no globus sensation, no heartburn, no hoarse voice, no nausea, no sore throat, no stridor, no tooth decay, no water brash or no wheezing. This is a chronic problem. The current episode started more than 1 year ago. The  problem occurs occasionally. The problem has been waxing and waning. The symptoms are aggravated by certain foods. Pertinent negatives include no anemia, fatigue, melena, muscle weakness, orthopnea or weight loss. He has tried a PPI for the symptoms. The treatment provided moderate relief. Past procedures do not include an abdominal ultrasound, an EGD, esophageal manometry, esophageal pH monitoring, H. pylori antibody titer or a UGI.  Hypertension This is a chronic problem. The current episode started more than 1 year ago. The problem has been gradually improving since onset. The problem is controlled. Pertinent negatives include no anxiety, blurred vision, chest pain, headaches, malaise/fatigue, neck pain, orthopnea, palpitations, peripheral edema, PND, shortness of breath or sweats. There are no associated agents to hypertension. Risk factors for coronary artery disease include dyslipidemia.    Lab Results  Component Value Date   CREATININE 0.75 (L) 09/25/2019   BUN 13 09/25/2019   NA 139 09/25/2019   K 3.6 09/25/2019   CL 101 09/25/2019   CO2 22 09/25/2019   Lab Results  Component Value Date   CHOL 176 09/25/2019   HDL 41 09/25/2019   LDLCALC 104 (H) 09/25/2019   TRIG 180 (H) 09/25/2019   CHOLHDL 3.8 11/08/2018   No results found for: TSH Lab Results  Component Value Date   HGBA1C 7.7 (H) 09/25/2019   Lab Results  Component Value Date   WBC 6.8 01/24/2019   HGB 16.4 01/24/2019   HCT 45.0 01/24/2019   MCV 90.2 01/24/2019   PLT 189 01/24/2019   Lab Results  Component Value Date   ALT 50 (H) 01/24/2019   AST 29 01/24/2019   ALKPHOS 49 01/24/2019   BILITOT 1.7 (H) 01/24/2019     Review of Systems  Constitutional: Negative for chills, fatigue, fever, malaise/fatigue and weight  loss.  HENT: Negative for drooling, ear discharge, ear pain, hoarse voice and sore throat.   Eyes: Negative for blurred vision.  Respiratory: Negative for cough, choking, shortness of breath and  wheezing.   Cardiovascular: Negative for chest pain, palpitations, orthopnea, leg swelling and PND.  Gastrointestinal: Negative for abdominal pain, blood in stool, constipation, diarrhea, dysphagia, heartburn, melena and nausea.  Endocrine: Negative for polydipsia, polyphagia and polyuria.  Genitourinary: Negative for dysuria, frequency, hematuria and urgency.  Musculoskeletal: Negative for back pain, myalgias, muscle weakness and neck pain.  Skin: Negative for pallor and rash.  Allergic/Immunologic: Negative for environmental allergies.  Neurological: Negative for dizziness, tremors, seizures, speech difficulty, weakness and headaches.  Hematological: Does not bruise/bleed easily.  Psychiatric/Behavioral: Negative for confusion and suicidal ideas. The patient is not nervous/anxious.     Patient Active Problem List   Diagnosis Date Noted  . Encounter for screening colonoscopy   . Gastroesophageal reflux disease 12/05/2017  . Incarcerated umbilical hernia   . Umbilical hernia 78/24/2353  . Diastasis recti 10/08/2015  . Essential hypertension 04/21/2015  . Hyperlipidemia 04/21/2015    No Known Allergies  Past Surgical History:  Procedure Laterality Date  . COLONOSCOPY WITH PROPOFOL N/A 12/16/2018   Procedure: COLONOSCOPY WITH PROPOFOL;  Surgeon: Lucilla Lame, MD;  Location: [facility];  Service: Endoscopy;  Laterality: N/A;  Diabetic - insulin and oral meds  . KIDNEY STONE SURGERY    . LITHOTRIPSY    . UMBILICAL HERNIA REPAIR N/A 12/20/2015   Procedure: HERNIA REPAIR UMBILICAL ADULT;  Surgeon: Hubbard Robinson, MD;  Location: [facility];  Service: General;  Laterality: N/A;    Social History   Tobacco Use  . Smoking status: Never Smoker  . Smokeless tobacco: Never Used  Vaping Use  . Vaping Use: Never used  Substance Use Topics  . Alcohol use: Yes    Alcohol/week: 0.0 standard drinks    Comment: rare  . Drug use: No     Medication list has been reviewed and  updated.  Current Meds  Medication Sig  . aspirin 81 MG tablet Take 1 tablet (81 mg total) by mouth daily.  . empagliflozin (JARDIANCE) 10 MG TABS tablet TAKE (1) TABLET BY MOUTH EVERY DAY  . gabapentin (NEURONTIN) 300 MG capsule Take 300 mg by mouth 3 (three) times daily. One in AM two in PM - [place]  . GLOBAL EASE INJECT PEN NEEDLES 31G X 8 MM MISC USE 1 PENTIP DAILY WITH INSULIN  . LANTUS SOLOSTAR 100 UNIT/ML Solostar Pen INJECT 40 UNITS INTO THE SKIN AT BEDTIME (Patient taking differently: 44 Units. )  . lisinopril (ZESTRIL) 5 MG tablet TAKE (1) TABLET BY MOUTH EVERY MORNING  . metFORMIN (GLUCOPHAGE) 500 MG tablet Take 1 tablet (500 mg total) by mouth 2 (two) times daily.  . pantoprazole (PROTONIX) 40 MG tablet One a day  . tamsulosin (FLOMAX) 0.4 MG CAPS capsule TAKE (1) CAPSULE BY MOUTH EVERY DAY    PHQ 2/9 Scores 09/25/2019 05/13/2019 11/08/2018 07/18/2018  PHQ - 2 Score 0 0 0 0  PHQ- 9 Score 3 0 0 0    GAD 7 : Generalized Anxiety Score 09/25/2019  Nervous, Anxious, on Edge 0  Control/stop worrying 0  Worry too much - different things 0  Trouble relaxing 0  Restless 0  Easily annoyed or irritable 0  Afraid - awful might happen 0  Total GAD 7 Score 0  Anxiety Difficulty Not difficult at all    BP Readings from Last  3 Encounters:  01/27/20 134/88  09/25/19 120/70  05/13/19 128/70    Physical Exam Vitals and nursing note reviewed.  HENT:     Head: Normocephalic.     Jaw: There is normal jaw occlusion.     Right Ear: Ear canal and external ear normal. Tympanic membrane is retracted.     Left Ear: Ear canal and external ear normal. Tympanic membrane is retracted.     Nose: Nose normal.     Mouth/Throat:     Lips: Pink.     Mouth: Mucous membranes are moist.     Pharynx: Oropharynx is clear.  Eyes:     General: No scleral icterus.       Right eye: No discharge.        Left eye: No discharge.     Conjunctiva/sclera: Conjunctivae normal.     Pupils: Pupils are equal,  round, and reactive to light.  Neck:     Thyroid: No thyromegaly.     Vascular: Normal carotid pulses. No carotid bruit, hepatojugular reflux or JVD.     Trachea: No tracheal deviation.  Cardiovascular:     Rate and Rhythm: Normal rate and regular rhythm.     Pulses: Normal pulses.     Heart sounds: Normal heart sounds, S1 normal and S2 normal. No murmur heard.  No systolic murmur is present.  No diastolic murmur is present.  No friction rub. No gallop. No S3 or S4 sounds.   Pulmonary:     Effort: Pulmonary effort is normal. No respiratory distress.     Breath sounds: Normal breath sounds. No decreased breath sounds, wheezing, rhonchi or rales.  Abdominal:     General: Bowel sounds are normal.     Palpations: Abdomen is soft. There is no mass.     Tenderness: There is no abdominal tenderness. There is no guarding or rebound.  Musculoskeletal:        General: No tenderness. Normal range of motion.     Cervical back: Normal range of motion and neck supple.     Right lower leg: No edema.     Left lower leg: No edema.  Lymphadenopathy:     Cervical: No cervical adenopathy.     Right cervical: No superficial, deep or posterior cervical adenopathy.    Left cervical: No superficial, deep or posterior cervical adenopathy.  Skin:    General: Skin is warm.     Capillary Refill: Capillary refill takes less than 2 seconds.     Findings: Rash present. Rash is macular.     Comments: Macular nevus left foot  Neurological:     General: No focal deficit present.     Mental Status: He is alert and oriented to person, place, and time.     Cranial Nerves: No cranial nerve deficit.     Sensory: No sensory deficit.     Deep Tendon Reflexes: Reflexes are normal and symmetric.  Psychiatric:        Mood and Affect: Mood normal.        Behavior: Behavior normal.     Wt Readings from Last 3 Encounters:  01/27/20 183 lb (83 kg)  09/25/19 187 lb (84.8 kg)  05/13/19 186 lb (84.4 kg)    BP 134/88    Pulse 77   Ht 5\' 6"  (1.676 m)   Wt 183 lb (83 kg)   SpO2 98%   BMI 29.54 kg/m   Assessment and Plan: 1. Type 2 diabetes mellitus without complication, without  long-term current use of insulin (HCC) Chronic.  Controlled.  Stable.  Continue Jardiance 10 mg once a day, Lantus 40 units nightly, and Metformin 500 mg twice a day.  We will obtain an A1c to assess current level of control. - empagliflozin (JARDIANCE) 10 MG TABS tablet; TAKE (1) TABLET BY MOUTH EVERY DAY  Dispense: 30 tablet; Refill: 5 - insulin glargine (LANTUS SOLOSTAR) 100 UNIT/ML Solostar Pen; INJECT 40 UNITS INTO THE SKIN AT BEDTIME  Dispense: 15 mL; Refill: 1 - metFORMIN (GLUCOPHAGE) 500 MG tablet; Take 1 tablet (500 mg total) by mouth 2 (two) times daily.  Dispense: 180 tablet; Refill: 1 - Hemoglobin A1c  2. Essential hypertension Chronic.  Controlled.  Stable.  Continue lisinopril 5 mg once a day.  Will check renal function panel for electrolytes and GFR. - lisinopril (ZESTRIL) 5 MG tablet; TAKE (1) TABLET BY MOUTH EVERY MORNING  Dispense: 90 tablet; Refill: 1 - Renal Function Panel  3. Gastroesophageal reflux disease Chronic.  Controlled.  Stable.  Continue pantoprazole 40 mg once a day. - pantoprazole (PROTONIX) 40 MG tablet; One a day  Dispense: 90 tablet; Refill: 1  4. Benign prostatic hyperplasia with urinary frequency Chronic.  Controlled.  Stable.  Continue tamsulosin 0.4 mg once a day. - tamsulosin (FLOMAX) 0.4 MG CAPS capsule; TAKE (1) CAPSULE BY MOUTH EVERY DAY  Dispense: 90 capsule; Refill: 1  5. Mixed hyperlipidemia Chronic.  Controlled.  Stable.  We will continue with dietary control but will check lipid panel to see if statin is necessary. - Lipid Panel With LDL/HDL Ratio  6. Nevus of plantar aspect of left foot Newly noted during foot exam for diabetic there is a macular nevus with some variegation of pigment.  Will refer to dermatology for evaluation. - Ambulatory referral to Dermatology

## 2020-03-23 HISTORY — PX: ROTATOR CUFF REPAIR: SHX139

## 2020-05-25 ENCOUNTER — Other Ambulatory Visit: Payer: Self-pay | Admitting: Family Medicine

## 2020-05-25 DIAGNOSIS — E119 Type 2 diabetes mellitus without complications: Secondary | ICD-10-CM

## 2020-06-08 ENCOUNTER — Other Ambulatory Visit: Payer: Self-pay

## 2020-06-08 ENCOUNTER — Encounter: Payer: Self-pay | Admitting: Family Medicine

## 2020-06-08 ENCOUNTER — Ambulatory Visit: Payer: 59 | Admitting: Family Medicine

## 2020-06-08 VITALS — BP 120/80 | HR 60 | Ht 66.0 in | Wt 186.0 lb

## 2020-06-08 DIAGNOSIS — M519 Unspecified thoracic, thoracolumbar and lumbosacral intervertebral disc disorder: Secondary | ICD-10-CM | POA: Diagnosis not present

## 2020-06-08 MED ORDER — MELOXICAM 15 MG PO TABS: 15.0000 mg | ORAL_TABLET | Freq: Every day | ORAL | 0 refills | Status: DC

## 2020-06-08 MED ORDER — CYCLOBENZAPRINE HCL 5 MG PO TABS: 5.0000 mg | ORAL_TABLET | Freq: Three times a day (TID) | ORAL | 1 refills | Status: DC | PRN

## 2020-06-08 MED ORDER — PREDNISONE 10 MG PO TABS: 10.0000 mg | ORAL_TABLET | Freq: Every day | ORAL | 0 refills | Status: DC

## 2020-06-08 NOTE — Progress Notes (Signed)
Date:  06/08/2020   Name:  Kevin Montgomery   DOB:  19-Jun-1965   MRN:  [id number]   Chief Complaint: Back Pain (Thinks he hurt it picking something up- on L) side/ swollen. Gets worse when bending or sitting)  Back Pain This is a new problem. The current episode started in the past 7 days. The problem has been waxing and waning since onset. The pain is present in the lumbar spine. The quality of the pain is described as aching. The pain is at a severity of 7/10. The pain is moderate. The symptoms are aggravated by bending and twisting. Associated symptoms include leg pain. Pertinent negatives include no abdominal pain, bladder incontinence, bowel incontinence, chest pain, dysuria, fever, headaches, numbness, paresis, paresthesias or tingling. He has tried NSAIDs for the symptoms. The treatment provided mild relief.    Lab Results  Component Value Date   CREATININE 0.80 01/27/2020   BUN 16 01/27/2020   NA 136 01/27/2020   K 4.0 01/27/2020   CL 102 01/27/2020   CO2 25 01/27/2020   Lab Results  Component Value Date   CHOL 179 01/27/2020   HDL 44 01/27/2020   LDLCALC 108 (H) 01/27/2020   TRIG 134 01/27/2020   CHOLHDL 4.1 01/27/2020   No results found for: TSH Lab Results  Component Value Date   HGBA1C 6.9 (H) 01/27/2020   Lab Results  Component Value Date   WBC 6.8 01/24/2019   HGB 16.4 01/24/2019   HCT 45.0 01/24/2019   MCV 90.2 01/24/2019   PLT 189 01/24/2019   Lab Results  Component Value Date   ALT 50 (H) 01/24/2019   AST 29 01/24/2019   ALKPHOS 49 01/24/2019   BILITOT 1.7 (H) 01/24/2019     Review of Systems  Constitutional: Negative for chills and fever.  HENT: Negative for drooling, ear discharge, ear pain and sore throat.   Respiratory: Negative for cough, shortness of breath and wheezing.   Cardiovascular: Negative for chest pain, palpitations and leg swelling.  Gastrointestinal: Negative for abdominal pain, blood in stool, bowel incontinence,  constipation, diarrhea and nausea.  Endocrine: Negative for polydipsia.  Genitourinary: Negative for bladder incontinence, dysuria, frequency, hematuria and urgency.  Musculoskeletal: Positive for back pain. Negative for myalgias and neck pain.  Skin: Negative for rash.  Allergic/Immunologic: Negative for environmental allergies.  Neurological: Negative for dizziness, tingling, numbness, headaches and paresthesias.  Hematological: Does not bruise/bleed easily.  Psychiatric/Behavioral: Negative for suicidal ideas. The patient is not nervous/anxious.     Patient Active Problem List   Diagnosis Date Noted  . Encounter for screening colonoscopy   . Gastroesophageal reflux disease 12/05/2017  . Incarcerated umbilical hernia   . Umbilical hernia 10/08/2015  . Diastasis recti 10/08/2015  . Essential hypertension 04/21/2015  . Hyperlipidemia 04/21/2015    No Known Allergies  Past Surgical History:  Procedure Laterality Date  . COLONOSCOPY WITH PROPOFOL N/A 12/16/2018   Procedure: COLONOSCOPY WITH PROPOFOL;  Surgeon: Midge Minium, MD;  Location: [facility];  Service: Endoscopy;  Laterality: N/A;  Diabetic - insulin and oral meds  . KIDNEY STONE SURGERY    . LITHOTRIPSY    . UMBILICAL HERNIA REPAIR N/A 12/20/2015   Procedure: HERNIA REPAIR UMBILICAL ADULT;  Surgeon: Gladis Riffle, MD;  Location: [facility];  Service: General;  Laterality: N/A;    Social History   Tobacco Use  . Smoking status: Never Smoker  . Smokeless tobacco: Never Used  Vaping Use  . Vaping  Use: Never used  Substance Use Topics  . Alcohol use: Yes    Alcohol/week: 0.0 standard drinks    Comment: rare  . Drug use: No     Medication list has been reviewed and updated.  Current Meds  Medication Sig  . aspirin 81 MG tablet Take 1 tablet (81 mg total) by mouth daily.  . empagliflozin (JARDIANCE) 10 MG TABS tablet TAKE (1) TABLET BY MOUTH EVERY DAY  . GLOBAL EASE INJECT PEN NEEDLES 31G X 8 MM MISC  USE 1 PENTIP DAILY WITH INSULIN  . LANTUS SOLOSTAR 100 UNIT/ML Solostar Pen INJECT 40 UNITS INTO THE SKIN AT BEDTIME. (Patient taking differently: 44 Units. INJECT 40 UNITS INTO THE SKIN AT BEDTIME)  . lisinopril (ZESTRIL) 5 MG tablet TAKE (1) TABLET BY MOUTH EVERY MORNING  . metFORMIN (GLUCOPHAGE) 500 MG tablet Take 1 tablet (500 mg total) by mouth 2 (two) times daily.  . pantoprazole (PROTONIX) 40 MG tablet One a day  . primidone (MYSOLINE) 50 MG tablet Take 50 mg by mouth. Seven tablets a day/ [facility]- neuro  . tamsulosin (FLOMAX) 0.4 MG CAPS capsule TAKE (1) CAPSULE BY MOUTH EVERY DAY    PHQ 2/9 Scores 09/25/2019 05/13/2019 11/08/2018 07/18/2018  PHQ - 2 Score 0 0 0 0  PHQ- 9 Score 3 0 0 0    GAD 7 : Generalized Anxiety Score 09/25/2019  Nervous, Anxious, on Edge 0  Control/stop worrying 0  Worry too much - different things 0  Trouble relaxing 0  Restless 0  Easily annoyed or irritable 0  Afraid - awful might happen 0  Total GAD 7 Score 0  Anxiety Difficulty Not difficult at all    BP Readings from Last 3 Encounters:  06/08/20 120/80  01/27/20 134/88  09/25/19 120/70    Physical Exam Vitals and nursing note reviewed.  HENT:     Head: Normocephalic.     Right Ear: Tympanic membrane, ear canal and external ear normal.     Left Ear: Tympanic membrane, ear canal and external ear normal.     Nose: Nose normal.     Mouth/Throat:     Mouth: Oropharynx is clear and moist.  Eyes:     General: No scleral icterus.       Right eye: No discharge.        Left eye: No discharge.     Extraocular Movements: EOM normal.     Conjunctiva/sclera: Conjunctivae normal.     Pupils: Pupils are equal, round, and reactive to light.  Neck:     Thyroid: No thyromegaly.     Vascular: No JVD.     Trachea: No tracheal deviation.  Cardiovascular:     Rate and Rhythm: Normal rate and regular rhythm.     Pulses: Intact distal pulses.     Heart sounds: Normal heart sounds. No murmur heard. No  friction rub. No gallop.   Pulmonary:     Effort: No respiratory distress.     Breath sounds: Normal breath sounds. No wheezing or rales.  Abdominal:     General: Bowel sounds are normal.     Palpations: Abdomen is soft. There is no hepatosplenomegaly or mass.     Tenderness: There is no abdominal tenderness. There is no CVA tenderness, guarding or rebound.  Musculoskeletal:        General: No tenderness or edema. Normal range of motion.     Cervical back: Normal range of motion and neck supple.     Lumbar  back: Negative right straight leg raise test and negative left straight leg raise test.  Lymphadenopathy:     Cervical: No cervical adenopathy.  Skin:    General: Skin is warm.     Findings: No rash.  Neurological:     Mental Status: He is alert and oriented to person, place, and time.     Cranial Nerves: No cranial nerve deficit.     Sensory: Sensation is intact.     Motor: Motor function is intact. No atrophy or abnormal muscle tone.     Deep Tendon Reflexes: Strength normal and reflexes are normal and symmetric.     Wt Readings from Last 3 Encounters:  06/08/20 186 lb (84.4 kg)  01/27/20 183 lb (83 kg)  09/25/19 187 lb (84.8 kg)    BP 120/80   Pulse 60   Ht 5\' 6"  (1.676 m)   Wt 186 lb (84.4 kg)   BMI 30.02 kg/m   Assessment and Plan:  1. Lumbar disc disease New onset.  Persistent.  Waxing and waning in intensity.  Exam and history is consistent with likely a lumbar disc herniation.  We will treat with cyclobenzaprine 5 mg 3 times a day but may take 2 at night if necessary.  Patient is also been given prednisone 10 mg once a day and cautioned about this may cause an increase in glucose levels with his diabetes we will do this for 10 to 14 days.  We also will prescribe meloxicam 15 mg once a day and may take Tylenol with this if pain has breakthrough. - cyclobenzaprine (FLEXERIL) 5 MG tablet; Take 1 tablet (5 mg total) by mouth 3 (three) times daily as needed for muscle  spasms.  Dispense: 30 tablet; Refill: 1 - predniSONE (DELTASONE) 10 MG tablet; Take 1 tablet (10 mg total) by mouth daily with breakfast.  Dispense: 30 tablet; Refill: 0 - meloxicam (MOBIC) 15 MG tablet; Take 1 tablet (15 mg total) by mouth daily.  Dispense: 30 tablet; Refill: 0

## 2020-08-03 ENCOUNTER — Encounter: Payer: Self-pay | Admitting: Family Medicine

## 2020-08-03 ENCOUNTER — Ambulatory Visit: Payer: 59 | Admitting: Family Medicine

## 2020-08-03 ENCOUNTER — Other Ambulatory Visit: Payer: Self-pay

## 2020-08-03 VITALS — BP 120/80 | HR 88 | Ht 66.0 in | Wt 185.0 lb

## 2020-08-03 DIAGNOSIS — I10 Essential (primary) hypertension: Secondary | ICD-10-CM

## 2020-08-03 DIAGNOSIS — E119 Type 2 diabetes mellitus without complications: Secondary | ICD-10-CM

## 2020-08-03 DIAGNOSIS — K219 Gastro-esophageal reflux disease without esophagitis: Secondary | ICD-10-CM | POA: Diagnosis not present

## 2020-08-03 DIAGNOSIS — E782 Mixed hyperlipidemia: Secondary | ICD-10-CM

## 2020-08-03 DIAGNOSIS — R350 Frequency of micturition: Secondary | ICD-10-CM

## 2020-08-03 DIAGNOSIS — N401 Enlarged prostate with lower urinary tract symptoms: Secondary | ICD-10-CM

## 2020-08-03 MED ORDER — EMPAGLIFLOZIN 10 MG PO TABS: ORAL_TABLET | ORAL | 5 refills | Status: DC

## 2020-08-03 MED ORDER — TAMSULOSIN HCL 0.4 MG PO CAPS: ORAL_CAPSULE | ORAL | 1 refills | Status: DC

## 2020-08-03 MED ORDER — LANTUS SOLOSTAR 100 UNIT/ML ~~LOC~~ SOPN: 44.0000 [IU] | PEN_INJECTOR | Freq: Every day | SUBCUTANEOUS | 1 refills | Status: DC

## 2020-08-03 MED ORDER — METFORMIN HCL 500 MG PO TABS: 500.0000 mg | ORAL_TABLET | Freq: Two times a day (BID) | ORAL | 1 refills | Status: DC

## 2020-08-03 MED ORDER — LISINOPRIL 5 MG PO TABS: ORAL_TABLET | ORAL | 1 refills | Status: DC

## 2020-08-03 MED ORDER — PANTOPRAZOLE SODIUM 40 MG PO TBEC: DELAYED_RELEASE_TABLET | ORAL | 1 refills | Status: DC

## 2020-08-03 NOTE — Progress Notes (Signed)
Date:  08/03/2020   Name:  Kevin Montgomery   DOB:  08-12-1965   MRN:  [id number]   Chief Complaint: Diabetes, Gastroesophageal Reflux, Hypertension, and Benign Prostatic Hypertrophy  Diabetes He presents for his follow-up diabetic visit. He has type 2 diabetes mellitus. His disease course has been stable. Pertinent negatives for hypoglycemia include no confusion, dizziness, headaches, hunger, mood changes, nervousness/anxiousness, pallor, seizures, sleepiness, speech difficulty, sweats or tremors. Pertinent negatives for diabetes include no blurred vision, no chest pain, no fatigue, no foot paresthesias, no foot ulcerations, no polydipsia, no polyphagia, no polyuria, no visual change, no weakness and no weight loss. There are no hypoglycemic complications. Symptoms are improving. There are no diabetic complications. Pertinent negatives for diabetic complications include no CVA, PVD or retinopathy. Risk factors for coronary artery disease include diabetes mellitus, dyslipidemia and hypertension. Current diabetic treatment includes oral agent (dual therapy) and insulin injections. He is compliant with treatment all of the time. His weight is stable. He is following a generally healthy diet. Meal planning includes avoidance of concentrated sweets and carbohydrate counting. He participates in exercise daily. His home blood glucose trend is fluctuating minimally. His breakfast blood glucose is taken between 8-9 am. His breakfast blood glucose range is generally 90-110 mg/dl. An ACE inhibitor/angiotensin II receptor blocker is being taken.  Gastroesophageal Reflux He complains of heartburn. He reports no abdominal pain, no belching, no chest pain, no choking, no coughing, no dysphagia, no early satiety, no globus sensation, no hoarse voice, no nausea, no sore throat, no stridor, no tooth decay, no water brash or no wheezing. This is a recurrent problem. The current episode started more than 1 year ago. The  problem occurs occasionally. The problem has been gradually improving. Pertinent negatives include no anemia, fatigue, melena, muscle weakness, orthopnea or weight loss. He has tried a PPI for the symptoms. The treatment provided moderate relief.  Hypertension This is a chronic problem. The current episode started more than 1 year ago. The problem has been gradually improving since onset. The problem is controlled. Pertinent negatives include no anxiety, blurred vision, chest pain, headaches, malaise/fatigue, neck pain, orthopnea, palpitations, peripheral edema, PND, shortness of breath or sweats. Risk factors for coronary artery disease include diabetes mellitus. The current treatment provides moderate improvement. There are no compliance problems.  There is no history of angina, kidney disease, CAD/MI, CVA, heart failure, left ventricular hypertrophy, PVD or retinopathy. There is no history of chronic renal disease, a hypertension causing med or renovascular disease.  Benign Prostatic Hypertrophy This is a chronic problem. The problem has been gradually improving since onset. Irritative symptoms include nocturia. Irritative symptoms do not include frequency or urgency. Obstructive symptoms do not include dribbling, incomplete emptying, an intermittent stream, a slower stream, straining or a weak stream. Pertinent negatives include no chills, dysuria, hematuria or nausea.    Lab Results  Component Value Date   CREATININE 0.80 01/27/2020   BUN 16 01/27/2020   NA 136 01/27/2020   K 4.0 01/27/2020   CL 102 01/27/2020   CO2 25 01/27/2020   Lab Results  Component Value Date   CHOL 179 01/27/2020   HDL 44 01/27/2020   LDLCALC 108 (H) 01/27/2020   TRIG 134 01/27/2020   CHOLHDL 4.1 01/27/2020   No results found for: TSH Lab Results  Component Value Date   HGBA1C 6.9 (H) 01/27/2020   Lab Results  Component Value Date   WBC 6.8 01/24/2019   HGB 16.4 01/24/2019  HCT 45.0 01/24/2019   MCV  90.2 01/24/2019   PLT 189 01/24/2019   Lab Results  Component Value Date   ALT 50 (H) 01/24/2019   AST 29 01/24/2019   ALKPHOS 49 01/24/2019   BILITOT 1.7 (H) 01/24/2019     Review of Systems  Constitutional: Negative for chills, fatigue, fever, malaise/fatigue and weight loss.  HENT: Negative for drooling, ear discharge, ear pain, hoarse voice and sore throat.   Eyes: Negative for blurred vision.  Respiratory: Negative for cough, choking, shortness of breath and wheezing.   Cardiovascular: Negative for chest pain, palpitations, orthopnea, leg swelling and PND.  Gastrointestinal: Positive for heartburn. Negative for abdominal pain, blood in stool, constipation, diarrhea, dysphagia, melena and nausea.  Endocrine: Negative for polydipsia, polyphagia and polyuria.  Genitourinary: Positive for nocturia. Negative for dysuria, frequency, hematuria, incomplete emptying and urgency.  Musculoskeletal: Negative for back pain, myalgias, muscle weakness and neck pain.  Skin: Negative for pallor and rash.  Allergic/Immunologic: Negative for environmental allergies.  Neurological: Negative for dizziness, tremors, seizures, speech difficulty, weakness and headaches.  Hematological: Does not bruise/bleed easily.  Psychiatric/Behavioral: Negative for confusion and suicidal ideas. The patient is not nervous/anxious.     Patient Active Problem List   Diagnosis Date Noted  . Encounter for screening colonoscopy   . Gastroesophageal reflux disease 12/05/2017  . Incarcerated umbilical hernia   . Umbilical hernia 35/57/3220  . Diastasis recti 10/08/2015  . Essential hypertension 04/21/2015  . Hyperlipidemia 04/21/2015    No Known Allergies  Past Surgical History:  Procedure Laterality Date  . COLONOSCOPY WITH PROPOFOL N/A 12/16/2018   Procedure: COLONOSCOPY WITH PROPOFOL;  Surgeon: Lucilla Lame, MD;  Location: [facility];  Service: Endoscopy;  Laterality: N/A;  Diabetic - insulin and oral  meds  . KIDNEY STONE SURGERY    . LITHOTRIPSY    . UMBILICAL HERNIA REPAIR N/A 12/20/2015   Procedure: HERNIA REPAIR UMBILICAL ADULT;  Surgeon: Hubbard Robinson, MD;  Location: [facility];  Service: General;  Laterality: N/A;    Social History   Tobacco Use  . Smoking status: Never Smoker  . Smokeless tobacco: Never Used  Vaping Use  . Vaping Use: Never used  Substance Use Topics  . Alcohol use: Yes    Alcohol/week: 0.0 standard drinks    Comment: rare  . Drug use: No     Medication list has been reviewed and updated.  Current Meds  Medication Sig  . aspirin 81 MG tablet Take 1 tablet (81 mg total) by mouth daily.  . empagliflozin (JARDIANCE) 10 MG TABS tablet TAKE (1) TABLET BY MOUTH EVERY DAY  . GLOBAL EASE INJECT PEN NEEDLES 31G X 8 MM MISC USE 1 PENTIP DAILY WITH INSULIN  . LANTUS SOLOSTAR 100 UNIT/ML Solostar Pen INJECT 40 UNITS INTO THE SKIN AT BEDTIME. (Patient taking differently: 44 Units. INJECT 40 UNITS INTO THE SKIN AT BEDTIME)  . lisinopril (ZESTRIL) 5 MG tablet TAKE (1) TABLET BY MOUTH EVERY MORNING  . metFORMIN (GLUCOPHAGE) 500 MG tablet Take 1 tablet (500 mg total) by mouth 2 (two) times daily.  . pantoprazole (PROTONIX) 40 MG tablet One a day  . primidone (MYSOLINE) 50 MG tablet Take 50 mg by mouth. 12 tablets a day/ [facility]- neuro  . tamsulosin (FLOMAX) 0.4 MG CAPS capsule TAKE (1) CAPSULE BY MOUTH EVERY DAY    PHQ 2/9 Scores 08/03/2020 09/25/2019 05/13/2019 11/08/2018  PHQ - 2 Score 0 0 0 0  PHQ- 9 Score 1 3 0 0  GAD 7 : Generalized Anxiety Score 08/03/2020 09/25/2019  Nervous, Anxious, on Edge 0 0  Control/stop worrying 0 0  Worry too much - different things 0 0  Trouble relaxing 0 0  Restless 0 0  Easily annoyed or irritable 0 0  Afraid - awful might happen 0 0  Total GAD 7 Score 0 0  Anxiety Difficulty - Not difficult at all    BP Readings from Last 3 Encounters:  08/03/20 120/80  06/08/20 120/80  01/27/20 134/88    Physical Exam Vitals and  nursing note reviewed.  HENT:     Head: Normocephalic.     Right Ear: Tympanic membrane and external ear normal.     Left Ear: Tympanic membrane and external ear normal.     Nose: Nose normal. No congestion or rhinorrhea.     Mouth/Throat:     Mouth: Oropharynx is clear and moist. Mucous membranes are moist.  Eyes:     General: No scleral icterus.       Right eye: No discharge.        Left eye: No discharge.     Extraocular Movements: EOM normal.     Conjunctiva/sclera: Conjunctivae normal.     Pupils: Pupils are equal, round, and reactive to light.  Neck:     Thyroid: No thyromegaly.     Vascular: No JVD.     Trachea: No tracheal deviation.  Cardiovascular:     Rate and Rhythm: Normal rate and regular rhythm.     Pulses: Intact distal pulses.          Dorsalis pedis pulses are 2+ on the right side and 2+ on the left side.       Posterior tibial pulses are 2+ on the right side and 2+ on the left side.     Heart sounds: Normal heart sounds. No murmur heard. No friction rub. No gallop.   Pulmonary:     Effort: No respiratory distress.     Breath sounds: Normal breath sounds. No wheezing, rhonchi or rales.  Chest:     Chest wall: No tenderness.  Abdominal:     General: Bowel sounds are normal.     Palpations: Abdomen is soft. There is no hepatosplenomegaly or mass.     Tenderness: There is no abdominal tenderness. There is no CVA tenderness, guarding or rebound.  Musculoskeletal:        General: No tenderness or edema. Normal range of motion.     Cervical back: Normal range of motion and neck supple.  Feet:     Right foot:     Protective Sensation: 10 sites tested. 10 sites sensed.     Skin integrity: Skin integrity normal.     Toenail Condition: Right toenails are normal.     Left foot:     Protective Sensation: 10 sites tested. 10 sites sensed.     Skin integrity: Skin integrity normal.     Toenail Condition: Left toenails are normal.  Lymphadenopathy:     Cervical: No  cervical adenopathy.  Skin:    General: Skin is warm.     Capillary Refill: Capillary refill takes less than 2 seconds.     Findings: No rash.  Neurological:     Mental Status: He is alert and oriented to person, place, and time.     Cranial Nerves: No cranial nerve deficit.     Deep Tendon Reflexes: Strength normal and reflexes are normal and symmetric.     Wt Readings  from Last 3 Encounters:  08/03/20 185 lb (83.9 kg)  06/08/20 186 lb (84.4 kg)  01/27/20 183 lb (83 kg)    BP 120/80   Pulse 88   Ht 5\' 6"  (1.676 m)   Wt 185 lb (83.9 kg)   BMI 29.86 kg/m   Assessment and Plan:  1. Type 2 diabetes mellitus without complication, without long-term current use of insulin (HCC) Chronic.  Controlled.  Stable.  Continue Jardiance 10 mg once a day, Lantus 44 units nightly, and Metformin 500 mg 1 twice a day.  Will check A1c and microalbuminuria as we will add renal function panel. - empagliflozin (JARDIANCE) 10 MG TABS tablet; TAKE (1) TABLET BY MOUTH EVERY DAY  Dispense: 30 tablet; Refill: 5 - insulin glargine (LANTUS SOLOSTAR) 100 UNIT/ML Solostar Pen; Inject 44 Units into the skin at bedtime. INJECT 44 UNITS INTO THE SKIN AT BEDTIME  Dispense: 15 mL; Refill: 1 - metFORMIN (GLUCOPHAGE) 500 MG tablet; Take 1 tablet (500 mg total) by mouth 2 (two) times daily.  Dispense: 180 tablet; Refill: 1 - HgB A1c - Microalbumin, urine - Renal Function Panel  2. Essential hypertension Chronic.  Controlled.  Stable.  Blood pressure 120/80.  Continue lisinopril 5 mg once a day.  Will check renal function panel. - lisinopril (ZESTRIL) 5 MG tablet; TAKE (1) TABLET BY MOUTH EVERY MORNING  Dispense: 90 tablet; Refill: 1 - Renal Function Panel  3. Gastroesophageal reflux disease Chronic.  Controlled.  Stable.  Continue pantoprazole 40 mg once a day. - pantoprazole (PROTONIX) 40 MG tablet; One a day  Dispense: 90 tablet; Refill: 1  4. Benign prostatic hyperplasia with urinary frequency Chronic.   Controlled.  Stable.  Continue tamsulosin 0.4 mg 1 tablet daily. - tamsulosin (FLOMAX) 0.4 MG CAPS capsule; TAKE (1) CAPSULE BY MOUTH EVERY DAY  Dispense: 90 capsule; Refill: 1  5. Mixed hyperlipidemia Chronic.  Controlled.  Stable.  Continue dietary control lipids.  Will check lipid panel for current LDL status. - Lipid Panel With LDL/HDL Ratio

## 2020-08-04 LAB — RENAL FUNCTION PANEL
Albumin: 4.8 g/dL (ref 3.8–4.9)
BUN/Creatinine Ratio: 16 (ref 9–20)
BUN: 12 mg/dL (ref 6–24)
CO2: 22 mmol/L (ref 20–29)
Calcium: 9.4 mg/dL (ref 8.7–10.2)
Chloride: 97 mmol/L (ref 96–106)
Creatinine, Ser: 0.73 mg/dL — ABNORMAL LOW (ref 0.76–1.27)
GFR calc Af Amer: 122 mL/min/{1.73_m2} (ref >59)
GFR calc non Af Amer: 105 mL/min/{1.73_m2} (ref >59)
Glucose: 110 mg/dL — ABNORMAL HIGH (ref 65–99)
Phosphorus: 3.1 mg/dL (ref 2.8–4.1)
Potassium: 4 mmol/L (ref 3.5–5.2)
Sodium: 137 mmol/L (ref 134–144)

## 2020-08-04 LAB — LIPID PANEL WITH LDL/HDL RATIO
Cholesterol, Total: 164 mg/dL (ref 100–199)
HDL: 36 mg/dL — ABNORMAL LOW (ref >39)
LDL Chol Calc (NIH): 105 mg/dL — ABNORMAL HIGH (ref 0–99)
LDL/HDL Ratio: 2.9 ratio (ref 0.0–3.6)
Triglycerides: 129 mg/dL (ref 0–149)
VLDL Cholesterol Cal: 23 mg/dL (ref 5–40)

## 2020-08-04 LAB — MICROALBUMIN, URINE: Microalbumin, Urine: 5.7 ug/mL

## 2020-08-04 LAB — HEMOGLOBIN A1C
Est. average glucose Bld gHb Est-mCnc: 140 mg/dL
Hgb A1c MFr Bld: 6.5 % — ABNORMAL HIGH (ref 4.8–5.6)

## 2020-09-16 ENCOUNTER — Other Ambulatory Visit: Payer: Self-pay | Admitting: Family Medicine

## 2020-09-16 DIAGNOSIS — E119 Type 2 diabetes mellitus without complications: Secondary | ICD-10-CM

## 2020-09-21 ENCOUNTER — Other Ambulatory Visit: Payer: Self-pay

## 2020-09-21 ENCOUNTER — Ambulatory Visit
Admission: RE | Admit: 2020-09-21 | Discharge: 2020-09-21 | Disposition: A | Discharge status: Home / Self Care | Payer: 59 | Source: Ambulatory Visit | Attending: Family Medicine | Admitting: Family Medicine

## 2020-09-21 ENCOUNTER — Ambulatory Visit: Payer: 59 | Admitting: Family Medicine

## 2020-09-21 ENCOUNTER — Ambulatory Visit
Admission: RE | Admit: 2020-09-21 | Discharge: 2020-09-21 | Disposition: A | Discharge status: Home / Self Care | Payer: 59 | Attending: Family Medicine | Admitting: Family Medicine

## 2020-09-21 ENCOUNTER — Encounter: Payer: Self-pay | Admitting: Family Medicine

## 2020-09-21 VITALS — BP 112/72 | HR 86 | Temp 98.2°F | Ht 66.0 in | Wt 181.0 lb

## 2020-09-21 DIAGNOSIS — J219 Acute bronchiolitis, unspecified: Secondary | ICD-10-CM

## 2020-09-21 DIAGNOSIS — J0130 Acute sphenoidal sinusitis, unspecified: Secondary | ICD-10-CM | POA: Diagnosis not present

## 2020-09-21 LAB — CBC WITH DIFFERENTIAL/PLATELET
Abs Immature Granulocytes: 0.02 10*3/uL (ref 0.00–0.07)
Basophils Absolute: 0 10*3/uL (ref 0.0–0.1)
Basophils Relative: 0 %
Eosinophils Absolute: 0.1 10*3/uL (ref 0.0–0.5)
Eosinophils Relative: 2 %
HCT: 42 % (ref 39.0–52.0)
Hemoglobin: 15.5 g/dL (ref 13.0–17.0)
Immature Granulocytes: 0 %
Lymphocytes Relative: 28 %
Lymphs Abs: 1.6 10*3/uL (ref 0.7–4.0)
MCH: 32.9 pg (ref 26.0–34.0)
MCHC: 36.9 g/dL — ABNORMAL HIGH (ref 30.0–36.0)
MCV: 89.2 fL (ref 80.0–100.0)
Monocytes Absolute: 0.5 10*3/uL (ref 0.1–1.0)
Monocytes Relative: 9 %
Neutro Abs: 3.5 10*3/uL (ref 1.7–7.7)
Neutrophils Relative %: 61 %
Platelets: 158 10*3/uL (ref 150–400)
RBC: 4.71 MIL/uL (ref 4.22–5.81)
RDW: 12.3 % (ref 11.5–15.5)
WBC: 5.7 10*3/uL (ref 4.0–10.5)
nRBC: 0 % (ref 0.0–0.2)

## 2020-09-21 MED ORDER — AZITHROMYCIN 250 MG PO TABS: ORAL_TABLET | ORAL | 0 refills | Status: DC

## 2020-09-21 NOTE — Progress Notes (Signed)
Date:  09/21/2020   Name:  Kevin Montgomery   DOB:  09/17/1965   MRN:  [id number]   Chief Complaint: Sinusitis (Cough- getting production up with it. Taking Sudafed otc- scratchy throat started Friday. COVID was negative yesterday)  Sinusitis This is a new problem. The current episode started in the past 7 days. The problem has been waxing and waning since onset. There has been no fever. The fever has been present for 3 to 4 days. The pain is moderate. Associated symptoms include congestion, coughing, shortness of breath and sinus pressure. Pertinent negatives include no chills, diaphoresis, ear pain, headaches, hoarse voice, neck pain, sneezing, sore throat or swollen glands. The treatment provided moderate relief.    Lab Results  Component Value Date   CREATININE 0.73 (L) 08/03/2020   BUN 12 08/03/2020   NA 137 08/03/2020   K 4.0 08/03/2020   CL 97 08/03/2020   CO2 22 08/03/2020   Lab Results  Component Value Date   CHOL 164 08/03/2020   HDL 36 (L) 08/03/2020   LDLCALC 105 (H) 08/03/2020   TRIG 129 08/03/2020   CHOLHDL 4.1 01/27/2020   No results found for: TSH Lab Results  Component Value Date   HGBA1C 6.5 (H) 08/03/2020   Lab Results  Component Value Date   WBC 6.8 01/24/2019   HGB 16.4 01/24/2019   HCT 45.0 01/24/2019   MCV 90.2 01/24/2019   PLT 189 01/24/2019   Lab Results  Component Value Date   ALT 50 (H) 01/24/2019   AST 29 01/24/2019   ALKPHOS 49 01/24/2019   BILITOT 1.7 (H) 01/24/2019     Review of Systems  Constitutional: Negative for chills, diaphoresis and fever.  HENT: Positive for congestion and sinus pressure. Negative for drooling, ear discharge, ear pain, hoarse voice, sneezing and sore throat.   Respiratory: Positive for cough and shortness of breath. Negative for wheezing.   Cardiovascular: Negative for chest pain, palpitations and leg swelling.  Gastrointestinal: Negative for abdominal pain, blood in stool, constipation, diarrhea and  nausea.  Endocrine: Negative for polydipsia.  Genitourinary: Negative for dysuria, frequency, hematuria and urgency.  Musculoskeletal: Negative for back pain, myalgias and neck pain.  Skin: Negative for rash.  Allergic/Immunologic: Negative for environmental allergies.  Neurological: Negative for dizziness and headaches.  Hematological: Does not bruise/bleed easily.  Psychiatric/Behavioral: Negative for suicidal ideas. The patient is not nervous/anxious.     Patient Active Problem List   Diagnosis Date Noted  . Encounter for screening colonoscopy   . Gastroesophageal reflux disease 12/05/2017  . Incarcerated umbilical hernia   . Umbilical hernia 62/70/3500  . Diastasis recti 10/08/2015  . Essential hypertension 04/21/2015  . Hyperlipidemia 04/21/2015    No Known Allergies  Past Surgical History:  Procedure Laterality Date  . COLONOSCOPY WITH PROPOFOL N/A 12/16/2018   Procedure: COLONOSCOPY WITH PROPOFOL;  Surgeon: Lucilla Lame, MD;  Location: [facility];  Service: Endoscopy;  Laterality: N/A;  Diabetic - insulin and oral meds  . KIDNEY STONE SURGERY    . LITHOTRIPSY    . UMBILICAL HERNIA REPAIR N/A 12/20/2015   Procedure: HERNIA REPAIR UMBILICAL ADULT;  Surgeon: Hubbard Robinson, MD;  Location: [facility];  Service: General;  Laterality: N/A;    Social History   Tobacco Use  . Smoking status: Never Smoker  . Smokeless tobacco: Never Used  Vaping Use  . Vaping Use: Never used  Substance Use Topics  . Alcohol use: Yes    Alcohol/week: 0.0  standard drinks    Comment: rare  . Drug use: No     Medication list has been reviewed and updated.  Current Meds  Medication Sig  . aspirin 81 MG tablet Take 1 tablet (81 mg total) by mouth daily.  . empagliflozin (JARDIANCE) 10 MG TABS tablet TAKE (1) TABLET BY MOUTH EVERY DAY  . GLOBAL EASE INJECT PEN NEEDLES 31G X 8 MM MISC USE 1 PENTIP DAILY WITH INSULIN  . insulin glargine (LANTUS SOLOSTAR) 100 UNIT/ML Solostar Pen  Inject 44 Units into the skin at bedtime. INJECT 44 UNITS INTO THE SKIN AT BEDTIME  . lisinopril (ZESTRIL) 5 MG tablet TAKE (1) TABLET BY MOUTH EVERY MORNING  . metFORMIN (GLUCOPHAGE) 500 MG tablet TAKE (1) TABLET BY MOUTH TWICE DAILY  . pantoprazole (PROTONIX) 40 MG tablet One a day  . primidone (MYSOLINE) 50 MG tablet Take 50 mg by mouth. 12 tablets a day/ [facility]- [facility]  . tamsulosin (FLOMAX) 0.4 MG CAPS capsule TAKE (1) CAPSULE BY MOUTH EVERY DAY    PHQ 2/9 Scores 08/03/2020 09/25/2019 05/13/2019 11/08/2018  PHQ - 2 Score 0 0 0 0  PHQ- 9 Score 1 3 0 0    GAD 7 : Generalized Anxiety Score 08/03/2020 09/25/2019  Nervous, Anxious, on Edge 0 0  Control/stop worrying 0 0  Worry too much - different things 0 0  Trouble relaxing 0 0  Restless 0 0  Easily annoyed or irritable 0 0  Afraid - awful might happen 0 0  Total GAD 7 Score 0 0  Anxiety Difficulty - Not difficult at all    BP Readings from Last 3 Encounters:  09/21/20 112/72  08/03/20 120/80  06/08/20 120/80    Physical Exam Vitals and nursing note reviewed.  HENT:     Head: Normocephalic.     Right Ear: Tympanic membrane, ear canal and external ear normal.     Left Ear: Tympanic membrane, ear canal and external ear normal.     Nose: Nose normal.     Mouth/Throat:     Mouth: Mucous membranes are moist.  Eyes:     General: No scleral icterus.       Right eye: No discharge.        Left eye: No discharge.     Conjunctiva/sclera: Conjunctivae normal.     Pupils: Pupils are equal, round, and reactive to light.  Neck:     Thyroid: No thyromegaly.     Vascular: No JVD.     Trachea: No tracheal deviation.  Cardiovascular:     Rate and Rhythm: Normal rate and regular rhythm.     Heart sounds: Normal heart sounds. No murmur heard. No friction rub. No gallop.   Pulmonary:     Effort: No respiratory distress.     Breath sounds: Normal breath sounds. No wheezing or rales.  Abdominal:     General: Bowel sounds are normal.      Palpations: Abdomen is soft. There is no mass.     Tenderness: There is no abdominal tenderness. There is no guarding or rebound.  Musculoskeletal:        General: No tenderness. Normal range of motion.     Cervical back: Normal range of motion and neck supple.  Lymphadenopathy:     Cervical: No cervical adenopathy.  Skin:    General: Skin is warm.     Findings: No rash.  Neurological:     Mental Status: He is alert and oriented to person, place, and  time.     Cranial Nerves: No cranial nerve deficit.     Deep Tendon Reflexes: Reflexes are normal and symmetric.     Wt Readings from Last 3 Encounters:  09/21/20 181 lb (82.1 kg)  08/03/20 185 lb (83.9 kg)  06/08/20 186 lb (84.4 kg)    BP 112/72   Pulse 86   Temp 98.2 F (36.8 C) (Oral)   Ht 5\' 6"  (1.676 m)   Wt 181 lb (82.1 kg)   SpO2 98%   BMI 29.21 kg/m   Assessment and Plan: 1. Acute non-recurrent sphenoidal sinusitis Acute.  Episodic.  Stable.  Patient has pressure discomfort behind both eyes which is consistent with an ethmoid and/or sphenoid sinusitis.  We will treat with azithromycin to 50 mg 2 today followed by 1 a day for 4 days and will obtain a CBC. - CBC with Differential/Platelet - azithromycin (ZITHROMAX) 250 MG tablet; 2 today then 1 a day for 4 days  Dispense: 6 tablet; Refill: 0  2. Bronchiolitis In addition patient's been experiencing a nonproductive cough with increasing shortness of breath.  We obtained a CBC and there is no leukocytosis noted or shift in neutrophils.  Chest x-ray notes there is no consolidation even there is overall decreased of breath sounds.  Chest x-ray was obtained and there was no cardiopulmonary abnormalities noted. - CBC with Differential/Platelet - DG Chest 2 View; Future

## 2020-09-22 ENCOUNTER — Telehealth: Payer: Self-pay

## 2020-09-22 NOTE — Telephone Encounter (Signed)
Normal xray and cbc

## 2020-09-22 NOTE — Telephone Encounter (Unsigned)
Copied from [place] [id number]. Topic: General - Other >> Sep 22, 2020  9:26 AM Yvette Rack wrote: Reason for CRM: Pt requests call back with update on x-ray and lab results.

## 2020-09-24 ENCOUNTER — Other Ambulatory Visit: Payer: Self-pay | Admitting: Family Medicine

## 2020-09-30 ENCOUNTER — Telehealth: Payer: Self-pay

## 2020-09-30 ENCOUNTER — Other Ambulatory Visit: Payer: Self-pay

## 2020-09-30 DIAGNOSIS — J0130 Acute sphenoidal sinusitis, unspecified: Secondary | ICD-10-CM

## 2020-09-30 MED ORDER — AZITHROMYCIN 250 MG PO TABS: ORAL_TABLET | ORAL | 0 refills | Status: DC

## 2020-09-30 NOTE — Progress Notes (Unsigned)
Sent in [employer]

## 2020-09-30 NOTE — Telephone Encounter (Signed)
Copied from [place] [phone]. Topic: General - Other >> Sep 30, 2020 11:40 AM Leward Quan A wrote: Reason for CRM: Patient called in to inquire of Dr Ronnald Ramp if he can have another Zpak sent to the pharmacy say that he was clearing up but still have a lot of coughing and hacking going on. Please call patient at Ph# [phone]

## 2020-09-30 NOTE — Telephone Encounter (Signed)
Sent in [employer]

## 2020-10-07 ENCOUNTER — Other Ambulatory Visit: Payer: Self-pay | Admitting: Family Medicine

## 2020-10-07 DIAGNOSIS — E119 Type 2 diabetes mellitus without complications: Secondary | ICD-10-CM

## 2020-11-10 ENCOUNTER — Other Ambulatory Visit: Payer: Self-pay | Admitting: Family Medicine

## 2020-11-10 DIAGNOSIS — E119 Type 2 diabetes mellitus without complications: Secondary | ICD-10-CM

## 2020-11-17 ENCOUNTER — Other Ambulatory Visit: Payer: Self-pay

## 2020-11-17 ENCOUNTER — Encounter: Payer: Self-pay | Admitting: Family Medicine

## 2020-11-17 ENCOUNTER — Ambulatory Visit: Payer: 59 | Admitting: Family Medicine

## 2020-11-17 ENCOUNTER — Other Ambulatory Visit: Payer: Self-pay | Admitting: Family Medicine

## 2020-11-17 VITALS — BP 100/60 | HR 80 | Ht 66.0 in | Wt 185.0 lb

## 2020-11-17 DIAGNOSIS — E119 Type 2 diabetes mellitus without complications: Secondary | ICD-10-CM

## 2020-11-17 DIAGNOSIS — R5383 Other fatigue: Secondary | ICD-10-CM

## 2020-11-17 MED ORDER — METFORMIN HCL 500 MG PO TABS: ORAL_TABLET | ORAL | 1 refills | Status: DC

## 2020-11-17 MED ORDER — LANTUS SOLOSTAR 100 UNIT/ML ~~LOC~~ SOPN: PEN_INJECTOR | SUBCUTANEOUS | 1 refills | Status: DC

## 2020-11-17 MED ORDER — EMPAGLIFLOZIN 10 MG PO TABS: ORAL_TABLET | ORAL | 5 refills | Status: DC

## 2020-11-17 NOTE — Progress Notes (Signed)
Date:  11/17/2020   Name:  Kevin Montgomery   DOB:  08/17/1965   MRN:  [id number]   Chief Complaint: Diabetes, Gastroesophageal Reflux, Hypertension, and Benign Prostatic Hypertrophy  Diabetes He presents for his follow-up diabetic visit. He has type 2 diabetes mellitus. His disease course has been stable. There are no hypoglycemic associated symptoms. Pertinent negatives for hypoglycemia include no dizziness, headaches, nervousness/anxiousness or sweats. Pertinent negatives for diabetes include no blurred vision, no chest pain, no fatigue, no foot paresthesias, no foot ulcerations, no polydipsia, no polyphagia, no polyuria, no visual change, no weakness and no weight loss. There are no hypoglycemic complications. Symptoms are stable. There are no diabetic complications. Risk factors for coronary artery disease include dyslipidemia and hypertension. Current diabetic treatment includes insulin injections and oral agent (dual therapy). He is compliant with treatment most of the time. His weight is fluctuating minimally. He is following a generally healthy diet. His home blood glucose trend is fluctuating minimally. His breakfast blood glucose is taken between 8-9 am. His breakfast blood glucose range is generally 110-130 mg/dl. An ACE inhibitor/angiotensin II receptor blocker is being taken.  Gastroesophageal Reflux He reports no abdominal pain, no belching, no chest pain, no choking, no coughing, no dysphagia, no early satiety, no heartburn, no hoarse voice, no nausea, no sore throat, no stridor or no wheezing. This is a chronic problem. The current episode started more than 1 year ago. The problem has been waxing and waning. The symptoms are aggravated by certain foods. Pertinent negatives include no anemia, fatigue, melena, muscle weakness, orthopnea or weight loss. He has tried a PPI for the symptoms. The treatment provided moderate relief.  Hypertension This is a chronic problem. The current episode  started more than 1 year ago. The problem is unchanged. The problem is controlled. Pertinent negatives include no anxiety, blurred vision, chest pain, headaches, malaise/fatigue, neck pain, orthopnea, palpitations, peripheral edema, PND, shortness of breath or sweats. There are no associated agents to hypertension. Past treatments include ACE inhibitors. The current treatment provides moderate improvement. There are no compliance problems.  There is no history of chronic renal disease, a hypertension causing med or renovascular disease.  Benign Prostatic Hypertrophy This is a chronic problem. The current episode started more than 1 year ago. The problem has been waxing and waning since onset. Irritative symptoms do not include frequency, nocturia or urgency. Obstructive symptoms do not include dribbling, incomplete emptying, an intermittent stream, a slower stream, straining or a weak stream. Pertinent negatives include no chills, dysuria, hematuria or nausea.    Lab Results  Component Value Date   CREATININE 0.73 (L) 08/03/2020   BUN 12 08/03/2020   NA 137 08/03/2020   K 4.0 08/03/2020   CL 97 08/03/2020   CO2 22 08/03/2020   Lab Results  Component Value Date   CHOL 164 08/03/2020   HDL 36 (L) 08/03/2020   LDLCALC 105 (H) 08/03/2020   TRIG 129 08/03/2020   CHOLHDL 4.1 01/27/2020   No results found for: TSH Lab Results  Component Value Date   HGBA1C 6.5 (H) 08/03/2020   Lab Results  Component Value Date   WBC 5.7 09/21/2020   HGB 15.5 09/21/2020   HCT 42.0 09/21/2020   MCV 89.2 09/21/2020   PLT 158 09/21/2020   Lab Results  Component Value Date   ALT 50 (H) 01/24/2019   AST 29 01/24/2019   ALKPHOS 49 01/24/2019   BILITOT 1.7 (H) 01/24/2019     Review  of Systems  Constitutional: Negative for chills, fatigue, fever, malaise/fatigue and weight loss.  HENT: Negative for drooling, ear discharge, ear pain, hoarse voice and sore throat.   Eyes: Negative for blurred vision.   Respiratory: Negative for cough, choking, shortness of breath and wheezing.   Cardiovascular: Negative for chest pain, palpitations, orthopnea, leg swelling and PND.  Gastrointestinal: Negative for abdominal pain, blood in stool, constipation, diarrhea, dysphagia, heartburn, melena and nausea.  Endocrine: Negative for polydipsia, polyphagia and polyuria.  Genitourinary: Negative for dysuria, frequency, hematuria, incomplete emptying, nocturia and urgency.  Musculoskeletal: Negative for back pain, myalgias, muscle weakness and neck pain.  Skin: Negative for rash.  Allergic/Immunologic: Negative for environmental allergies.  Neurological: Negative for dizziness, weakness and headaches.  Hematological: Does not bruise/bleed easily.  Psychiatric/Behavioral: Negative for suicidal ideas. The patient is not nervous/anxious.     Patient Active Problem List   Diagnosis Date Noted  . Encounter for screening colonoscopy   . Gastroesophageal reflux disease 12/05/2017  . Incarcerated umbilical hernia   . Umbilical hernia 16/60/6301  . Diastasis recti 10/08/2015  . Essential hypertension 04/21/2015  . Hyperlipidemia 04/21/2015    No Known Allergies  Past Surgical History:  Procedure Laterality Date  . COLONOSCOPY WITH PROPOFOL N/A 12/16/2018   Procedure: COLONOSCOPY WITH PROPOFOL;  Surgeon: Lucilla Lame, MD;  Location: [facility];  Service: Endoscopy;  Laterality: N/A;  Diabetic - insulin and oral meds  . KIDNEY STONE SURGERY    . LITHOTRIPSY    . UMBILICAL HERNIA REPAIR N/A 12/20/2015   Procedure: HERNIA REPAIR UMBILICAL ADULT;  Surgeon: Hubbard Robinson, MD;  Location: [facility];  Service: General;  Laterality: N/A;    Social History   Tobacco Use  . Smoking status: Never Smoker  . Smokeless tobacco: Never Used  Vaping Use  . Vaping Use: Never used  Substance Use Topics  . Alcohol use: Yes    Alcohol/week: 0.0 standard drinks    Comment: rare  . Drug use: No      Medication list has been reviewed and updated.  Current Meds  Medication Sig  . aspirin 81 MG tablet Take 1 tablet (81 mg total) by mouth daily.  . empagliflozin (JARDIANCE) 10 MG TABS tablet TAKE (1) TABLET BY MOUTH EVERY DAY  . GLOBAL EASE INJECT PEN NEEDLES 31G X 5 MM MISC USE 1 PENTIP DAILY WITH INSULIN  . GLOBAL EASE INJECT PEN NEEDLES 31G X 8 MM MISC USE 1 PENTIP DAILY WITH INSULIN  . LANTUS SOLOSTAR 100 UNIT/ML Solostar Pen INJECT 44 UNITS INTO THE SKIN AT BEDTIME.  Marland Kitchen lisinopril (ZESTRIL) 5 MG tablet TAKE (1) TABLET BY MOUTH EVERY MORNING  . metFORMIN (GLUCOPHAGE) 500 MG tablet TAKE (1) TABLET BY MOUTH TWICE DAILY  . pantoprazole (PROTONIX) 40 MG tablet One a day  . primidone (MYSOLINE) 50 MG tablet Take 50 mg by mouth. 12 tablets a day/ [facility]- [facility]  . tamsulosin (FLOMAX) 0.4 MG CAPS capsule TAKE (1) CAPSULE BY MOUTH EVERY DAY    PHQ 2/9 Scores 11/17/2020 08/03/2020 09/25/2019 05/13/2019  PHQ - 2 Score 0 0 0 0  PHQ- 9 Score 0 1 3 0    GAD 7 : Generalized Anxiety Score 11/17/2020 08/03/2020 09/25/2019  Nervous, Anxious, on Edge 0 0 0  Control/stop worrying 0 0 0  Worry too much - different things 0 0 0  Trouble relaxing 0 0 0  Restless 0 0 0  Easily annoyed or irritable 0 0 0  Afraid - awful might  happen 0 0 0  Total GAD 7 Score 0 0 0  Anxiety Difficulty - - Not difficult at all    BP Readings from Last 3 Encounters:  11/17/20 100/60  09/21/20 112/72  08/03/20 120/80    Physical Exam Vitals and nursing note reviewed.  HENT:     Head: Normocephalic.     Right Ear: Tympanic membrane, ear canal and external ear normal. There is no impacted cerumen.     Left Ear: Tympanic membrane, ear canal and external ear normal. There is no impacted cerumen.     Nose: Nose normal. No congestion or rhinorrhea.     Mouth/Throat:     Mouth: Mucous membranes are moist.  Eyes:     General: No scleral icterus.       Right eye: No discharge.        Left eye: No discharge.      Conjunctiva/sclera: Conjunctivae normal.     Pupils: Pupils are equal, round, and reactive to light.  Neck:     Thyroid: No thyromegaly.     Vascular: No JVD.     Trachea: No tracheal deviation.  Cardiovascular:     Rate and Rhythm: Normal rate and regular rhythm.     Heart sounds: Normal heart sounds. No murmur heard. No friction rub. No gallop.   Pulmonary:     Effort: No respiratory distress.     Breath sounds: Normal breath sounds. No wheezing, rhonchi or rales.  Abdominal:     General: Bowel sounds are normal.     Palpations: Abdomen is soft. There is no mass.     Tenderness: There is no abdominal tenderness. There is no guarding or rebound.  Musculoskeletal:        General: No tenderness. Normal range of motion.     Cervical back: Normal range of motion and neck supple.  Lymphadenopathy:     Cervical: No cervical adenopathy.  Skin:    General: Skin is warm.     Findings: No rash.  Neurological:     Mental Status: He is alert and oriented to person, place, and time.     Cranial Nerves: No cranial nerve deficit.     Deep Tendon Reflexes: Reflexes are normal and symmetric.     Wt Readings from Last 3 Encounters:  11/17/20 185 lb (83.9 kg)  09/21/20 181 lb (82.1 kg)  08/03/20 185 lb (83.9 kg)    BP 100/60   Pulse 80   Ht 5\' 6"  (1.676 m)   Wt 185 lb (83.9 kg)   BMI 29.86 kg/m   Assessment and Plan:  1. Type 2 diabetes mellitus without complication, without long-term current use of insulin (HCC) Chronic.  Controlled.  Stable.  Fasting blood sugar today was 112.  Patient has been on prednisone recently for a cervical disc which may affect his A1c we will take that in consideration.  Currently he is taking Jardiance 10 mg daily metformin 500 mg twice a day and Lantus 44 units nightly.  We will check A1c for current status. - HgB A1c - insulin glargine (LANTUS SOLOSTAR) 100 UNIT/ML Solostar Pen; INJECT 44 UNITS INTO THE SKIN AT BEDTIME.  Dispense: 45 mL; Refill: 1 -  empagliflozin (JARDIANCE) 10 MG TABS tablet; TAKE (1) TABLET BY MOUTH EVERY DAY  Dispense: 30 tablet; Refill: 5 - metFORMIN (GLUCOPHAGE) 500 MG tablet; TAKE (1) TABLET BY MOUTH TWICE DAILY  Dispense: 180 tablet; Refill: 1  2. Fatigue, unspecified type Patient's been feeling more fatigued  and reviewed that there has not been an issue with anemia or thyroid in the past we will check a testosterone total and free to see if there is an issue with this at this time. - Testosterone,Free and Total

## 2020-11-18 LAB — HEMOGLOBIN A1C
Est. average glucose Bld gHb Est-mCnc: 151 mg/dL
Hgb A1c MFr Bld: 6.9 % — ABNORMAL HIGH (ref 4.8–5.6)

## 2020-11-18 LAB — TESTOSTERONE,FREE AND TOTAL
Testosterone, Free: 7.9 pg/mL (ref 7.2–24.0)
Testosterone: 634 ng/dL (ref 264–916)

## 2021-02-16 ENCOUNTER — Other Ambulatory Visit: Payer: Self-pay | Admitting: Family Medicine

## 2021-02-16 DIAGNOSIS — N401 Enlarged prostate with lower urinary tract symptoms: Secondary | ICD-10-CM

## 2021-02-16 DIAGNOSIS — I10 Essential (primary) hypertension: Secondary | ICD-10-CM

## 2021-03-03 ENCOUNTER — Other Ambulatory Visit: Payer: Self-pay

## 2021-03-03 ENCOUNTER — Encounter: Payer: Self-pay | Admitting: Family Medicine

## 2021-03-03 ENCOUNTER — Telehealth: Payer: Self-pay

## 2021-03-03 ENCOUNTER — Ambulatory Visit: Payer: 59 | Admitting: Family Medicine

## 2021-03-03 VITALS — BP 120/80 | HR 72 | Ht 66.0 in | Wt 188.0 lb

## 2021-03-03 DIAGNOSIS — M5412 Radiculopathy, cervical region: Secondary | ICD-10-CM | POA: Diagnosis not present

## 2021-03-03 DIAGNOSIS — J0100 Acute maxillary sinusitis, unspecified: Secondary | ICD-10-CM

## 2021-03-03 MED ORDER — AMOXICILLIN-POT CLAVULANATE 875-125 MG PO TABS: 1.0000 | ORAL_TABLET | Freq: Two times a day (BID) | ORAL | 0 refills | Status: DC

## 2021-03-03 MED ORDER — PREDNISONE 10 MG PO TABS: 10.0000 mg | ORAL_TABLET | Freq: Every day | ORAL | 0 refills | Status: DC

## 2021-03-03 NOTE — Progress Notes (Signed)
Date:  03/03/2021   Name:  Kevin Montgomery   DOB:  Apr 15, 1966   MRN:  [id number]   Chief Complaint: Sinusitis and Arm Pain (Hurting down R) arm)  Sinusitis This is a new problem. The current episode started in the past 7 days. The problem has been gradually worsening since onset. There has been no fever. The pain is mild. Associated symptoms include congestion, headaches and sinus pressure. Pertinent negatives include no chills, coughing, diaphoresis, ear pain, hoarse voice, neck pain, shortness of breath, sneezing, sore throat or swollen glands. [place] Cowden production) Past treatments include acetaminophen.  Arm Pain  The pain is present in the right elbow, right fingers, right forearm, right hand, upper right arm, right wrist and right shoulder. The quality of the pain is described as aching. The pain is moderate. The pain has been Constant since the incident. Pertinent negatives include no chest pain, numbness or tingling.   Lab Results  Component Value Date   CREATININE 0.73 (L) 08/03/2020   BUN 12 08/03/2020   NA 137 08/03/2020   K 4.0 08/03/2020   CL 97 08/03/2020   CO2 22 08/03/2020   Lab Results  Component Value Date   CHOL 164 08/03/2020   HDL 36 (L) 08/03/2020   LDLCALC 105 (H) 08/03/2020   TRIG 129 08/03/2020   CHOLHDL 4.1 01/27/2020   No results found for: TSH Lab Results  Component Value Date   HGBA1C 6.9 (H) 11/17/2020   Lab Results  Component Value Date   WBC 5.7 09/21/2020   HGB 15.5 09/21/2020   HCT 42.0 09/21/2020   MCV 89.2 09/21/2020   PLT 158 09/21/2020   Lab Results  Component Value Date   ALT 50 (H) 01/24/2019   AST 29 01/24/2019   ALKPHOS 49 01/24/2019   BILITOT 1.7 (H) 01/24/2019     Review of Systems  Constitutional:  Negative for chills, diaphoresis and fever.  HENT:  Positive for congestion and sinus pressure. Negative for drooling, ear discharge, ear pain, hoarse voice, sneezing and sore throat.   Respiratory:  Negative for cough,  shortness of breath and wheezing.   Cardiovascular:  Negative for chest pain, palpitations and leg swelling.  Gastrointestinal:  Negative for abdominal pain, blood in stool, constipation, diarrhea and nausea.  Endocrine: Negative for polydipsia.  Genitourinary:  Negative for dysuria, frequency, hematuria and urgency.  Musculoskeletal:  Negative for back pain, myalgias and neck pain.  Skin:  Negative for rash.  Allergic/Immunologic: Negative for environmental allergies.  Neurological:  Positive for headaches. Negative for dizziness, tingling and numbness.  Hematological:  Does not bruise/bleed easily.  Psychiatric/Behavioral:  Negative for suicidal ideas. The patient is not nervous/anxious.    Patient Active Problem List   Diagnosis Date Noted   Encounter for screening colonoscopy    Gastroesophageal reflux disease 12/05/2017   Incarcerated umbilical hernia    Umbilical hernia 67/59/1638   Diastasis recti 10/08/2015   Essential hypertension 04/21/2015   Hyperlipidemia 04/21/2015    No Known Allergies  Past Surgical History:  Procedure Laterality Date   COLONOSCOPY WITH PROPOFOL N/A 12/16/2018   Procedure: COLONOSCOPY WITH PROPOFOL;  Surgeon: Lucilla Lame, MD;  Location: [facility];  Service: Endoscopy;  Laterality: N/A;  Diabetic - insulin and oral meds   KIDNEY STONE SURGERY     LITHOTRIPSY     UMBILICAL HERNIA REPAIR N/A 12/20/2015   Procedure: HERNIA REPAIR UMBILICAL ADULT;  Surgeon: Hubbard Robinson, MD;  Location: [facility];  Service: General;  Laterality: N/A;    Social History   Tobacco Use   Smoking status: Never   Smokeless tobacco: Never  Vaping Use   Vaping Use: Never used  Substance Use Topics   Alcohol use: Yes    Alcohol/week: 0.0 standard drinks    Comment: rare   Drug use: No     Medication list has been reviewed and updated.  Current Meds  Medication Sig   aspirin 81 MG tablet Take 1 tablet (81 mg total) by mouth daily.   empagliflozin  (JARDIANCE) 10 MG TABS tablet TAKE (1) TABLET BY MOUTH EVERY DAY   GLOBAL EASE INJECT PEN NEEDLES 31G X 5 MM MISC USE 1 PENTIP DAILY WITH INSULIN   GLOBAL EASE INJECT PEN NEEDLES 31G X 8 MM MISC USE 1 PENTIP DAILY WITH INSULIN   insulin glargine (LANTUS SOLOSTAR) 100 UNIT/ML Solostar Pen INJECT 44 UNITS INTO THE SKIN AT BEDTIME.   lisinopril (ZESTRIL) 5 MG tablet TAKE (1) TABLET BY MOUTH EVERY MORNING   metFORMIN (GLUCOPHAGE) 500 MG tablet TAKE (1) TABLET BY MOUTH TWICE DAILY   pantoprazole (PROTONIX) 40 MG tablet One a day   primidone (MYSOLINE) 50 MG tablet Take 50 mg by mouth. 12 tablets a day/ [facility]- neuro   tamsulosin (FLOMAX) 0.4 MG CAPS capsule TAKE (1) CAPSULE BY MOUTH EVERY DAY    PHQ 2/9 Scores 03/03/2021 11/17/2020 08/03/2020 09/25/2019  PHQ - 2 Score 0 0 0 0  PHQ- 9 Score 0 0 1 3    GAD 7 : Generalized Anxiety Score 03/03/2021 11/17/2020 08/03/2020 09/25/2019  Nervous, Anxious, on Edge 0 0 0 0  Control/stop worrying 0 0 0 0  Worry too much - different things 0 0 0 0  Trouble relaxing 0 0 0 0  Restless 0 0 0 0  Easily annoyed or irritable 0 0 0 0  Afraid - awful might happen 0 0 0 0  Total GAD 7 Score 0 0 0 0  Anxiety Difficulty - - - Not difficult at all    BP Readings from Last 3 Encounters:  03/03/21 120/80  11/17/20 100/60  09/21/20 112/72    Physical Exam Vitals and nursing note reviewed.  HENT:     Head: Normocephalic.     Salivary Glands: Right salivary gland is tender. Left salivary gland is tender.     Right Ear: Tympanic membrane, ear canal and external ear normal.     Left Ear: Tympanic membrane, ear canal and external ear normal.     Nose: Nose normal. No congestion or rhinorrhea.     Right Turbinates: Swollen.     Left Turbinates: Swollen.     Mouth/Throat:     Pharynx: Oropharynx is clear. Uvula midline. No posterior oropharyngeal erythema.  Eyes:     General: No scleral icterus.       Right eye: No discharge.        Left eye: No discharge.      Conjunctiva/sclera: Conjunctivae normal.     Pupils: Pupils are equal, round, and reactive to light.  Neck:     Thyroid: No thyromegaly.     Vascular: No JVD.     Trachea: No tracheal deviation.  Cardiovascular:     Rate and Rhythm: Normal rate and regular rhythm.     Heart sounds: Normal heart sounds. No murmur heard.   No friction rub. No gallop.  Pulmonary:     Effort: No respiratory distress.     Breath sounds: Normal breath sounds. No  wheezing, rhonchi or rales.  Abdominal:     General: Bowel sounds are normal.     Palpations: Abdomen is soft. There is no mass.     Tenderness: There is no abdominal tenderness. There is no guarding or rebound.  Musculoskeletal:        General: No tenderness. Normal range of motion.     Cervical back: Normal range of motion and neck supple.  Lymphadenopathy:     Cervical: No cervical adenopathy.  Skin:    General: Skin is warm.     Findings: No rash.  Neurological:     Mental Status: He is alert and oriented to person, place, and time.     Cranial Nerves: No cranial nerve deficit.     Deep Tendon Reflexes: Reflexes are normal and symmetric.    Wt Readings from Last 3 Encounters:  03/03/21 188 lb (85.3 kg)  11/17/20 185 lb (83.9 kg)  09/21/20 181 lb (82.1 kg)    BP 120/80   Pulse 72   Ht 5\' 6"  (1.676 m)   Wt 188 lb (85.3 kg)   BMI 30.34 kg/m   Assessment and Plan:  1. Cervical radiculopathy Chronic.  Uncontrolled.  This is been exacerbated and patient has a sinusitis that is going to push back the injection date anyway so we will initiate prednisone 10 mg once a day to reduce some inflammation. - predniSONE (DELTASONE) 10 MG tablet; Take 1 tablet (10 mg total) by mouth daily with breakfast.  Dispense: 10 tablet; Refill: 0  2. Acute maxillary sinusitis, recurrence not specified New onset.  Tenderness over the maxillary sinuses bilateral.  Exam is consistent with maxillary sinusitis which we will treat with Augmentin 875 mg twice a  day. - amoxicillin-clavulanate (AUGMENTIN) 875-125 MG tablet; Take 1 tablet by mouth 2 (two) times daily.  Dispense: 20 tablet; Refill: 0

## 2021-03-03 NOTE — Telephone Encounter (Signed)
Copied from [place] [phone]. Topic: General - Other >> Mar 03, 2021 12:57 PM Alanda Slim E wrote: Reason for CRM: pt return office call/ please advise

## 2021-03-28 ENCOUNTER — Ambulatory Visit: Payer: 59 | Admitting: Family Medicine

## 2021-04-15 ENCOUNTER — Other Ambulatory Visit: Payer: Self-pay | Admitting: Family Medicine

## 2021-04-15 DIAGNOSIS — K219 Gastro-esophageal reflux disease without esophagitis: Secondary | ICD-10-CM

## 2021-04-16 NOTE — Telephone Encounter (Signed)
Requested Prescriptions  Pending Prescriptions Disp Refills  . pantoprazole (PROTONIX) 40 MG tablet [Pharmacy Med Name: PANTOPRAZOLE SODIUM 40 MG DR TAB] 90 tablet 0    Sig: TAKE ONE (1) TABLET BY MOUTH ONCE DAILY     Gastroenterology: Proton Pump Inhibitors Passed - 04/15/2021  4:36 PM      Passed - Valid encounter within last 12 months    Recent Outpatient Visits          1 month ago Cervical radiculopathy   [facility] Juline Patch, MD   5 months ago Type 2 diabetes mellitus without complication, without long-term current use of insulin ([place])   [facility] Juline Patch, MD   6 months ago Acute non-recurrent sphenoidal sinusitis   [facility] Juline Patch, MD   8 months ago Type 2 diabetes mellitus without complication, without long-term current use of insulin ([place])   [facility] Juline Patch, MD   10 months ago Lumbar disc disease   [facility] Juline Patch, MD

## 2021-04-20 ENCOUNTER — Encounter: Payer: Self-pay | Admitting: Family Medicine

## 2021-04-20 ENCOUNTER — Ambulatory Visit (INDEPENDENT_AMBULATORY_CARE_PROVIDER_SITE_OTHER): Payer: 59 | Admitting: Family Medicine

## 2021-04-20 ENCOUNTER — Other Ambulatory Visit: Payer: Self-pay

## 2021-04-20 VITALS — BP 100/60 | HR 98 | Ht 66.0 in | Wt 182.0 lb

## 2021-04-20 DIAGNOSIS — E119 Type 2 diabetes mellitus without complications: Secondary | ICD-10-CM

## 2021-04-20 DIAGNOSIS — I10 Essential (primary) hypertension: Secondary | ICD-10-CM | POA: Diagnosis not present

## 2021-04-20 DIAGNOSIS — R066 Hiccough: Secondary | ICD-10-CM

## 2021-04-20 DIAGNOSIS — J029 Acute pharyngitis, unspecified: Secondary | ICD-10-CM | POA: Diagnosis not present

## 2021-04-20 LAB — POCT INFLUENZA A/B
Influenza A, POC: NEGATIVE
Influenza B, POC: NEGATIVE

## 2021-04-20 MED ORDER — METOCLOPRAMIDE HCL 5 MG PO TABS: 5.0000 mg | ORAL_TABLET | Freq: Three times a day (TID) | ORAL | 0 refills | Status: DC

## 2021-04-20 MED ORDER — AZITHROMYCIN 250 MG PO TABS: ORAL_TABLET | ORAL | 0 refills | Status: DC

## 2021-04-20 MED ORDER — GABAPENTIN 100 MG PO CAPS: 100.0000 mg | ORAL_CAPSULE | Freq: Three times a day (TID) | ORAL | 0 refills | Status: DC

## 2021-04-20 NOTE — Progress Notes (Signed)
Date:  04/20/2021   Name:  Kevin Montgomery   DOB:  27-Sep-1965   MRN:  [id number]   Chief Complaint: Diabetes, Hypertension, Gastroesophageal Reflux, Benign Prostatic Hypertrophy, and Sore Throat (COVID negative, no fever)  Diabetes He presents for his follow-up diabetic visit. He has type 2 diabetes mellitus. His disease course has been stable. There are no hypoglycemic associated symptoms. Pertinent negatives for hypoglycemia include no dizziness, headaches or nervousness/anxiousness. Pertinent negatives for diabetes include no blurred vision, no chest pain, no fatigue, no foot paresthesias, no foot ulcerations, no polydipsia, no polyphagia, no polyuria, no visual change, no weakness and no weight loss. There are no hypoglycemic complications. Symptoms are stable. There are no diabetic complications. Pertinent negatives for diabetic complications include no CVA, PVD or retinopathy. There are no known risk factors for coronary artery disease. Current diabetic treatment includes insulin injections and oral agent (dual therapy). He is compliant with treatment all of the time. Meal planning includes avoidance of concentrated sweets and carbohydrate counting. He participates in exercise daily. His breakfast blood glucose is taken between 8-9 am. His breakfast blood glucose range is generally 110-130 mg/dl. An ACE inhibitor/angiotensin II receptor blocker is being taken.  Hypertension This is a chronic problem. The current episode started more than 1 year ago. The problem has been gradually improving since onset. Pertinent negatives include no blurred vision, chest pain, headaches, neck pain, palpitations or shortness of breath. Past treatments include ACE inhibitors. There is no history of angina, kidney disease, CAD/MI, CVA, heart failure, left ventricular hypertrophy, PVD or retinopathy.  Gastroesophageal Reflux He reports no abdominal pain, no chest pain, no coughing, no nausea, no sore throat or no  wheezing. The problem has been gradually improving. Pertinent negatives include no fatigue or weight loss. He has tried a PPI for the symptoms.  Benign Prostatic Hypertrophy Irritative symptoms do not include frequency or urgency. Pertinent negatives include no chills, dysuria, hematuria or nausea.  Sore Throat  Pertinent negatives include no abdominal pain, coughing, diarrhea, drooling, ear discharge, ear pain, headaches, neck pain or shortness of breath.   Lab Results  Component Value Date   CREATININE 0.73 (L) 08/03/2020   BUN 12 08/03/2020   NA 137 08/03/2020   K 4.0 08/03/2020   CL 97 08/03/2020   CO2 22 08/03/2020   Lab Results  Component Value Date   CHOL 164 08/03/2020   HDL 36 (L) 08/03/2020   LDLCALC 105 (H) 08/03/2020   TRIG 129 08/03/2020   CHOLHDL 4.1 01/27/2020   No results found for: TSH Lab Results  Component Value Date   HGBA1C 6.9 (H) 11/17/2020   Lab Results  Component Value Date   WBC 5.7 09/21/2020   HGB 15.5 09/21/2020   HCT 42.0 09/21/2020   MCV 89.2 09/21/2020   PLT 158 09/21/2020   Lab Results  Component Value Date   ALT 50 (H) 01/24/2019   AST 29 01/24/2019   ALKPHOS 49 01/24/2019   BILITOT 1.7 (H) 01/24/2019     Review of Systems  Constitutional:  Negative for chills, fatigue, fever and weight loss.  HENT:  Negative for drooling, ear discharge, ear pain and sore throat.   Eyes:  Negative for blurred vision.  Respiratory:  Negative for cough, shortness of breath and wheezing.   Cardiovascular:  Negative for chest pain, palpitations and leg swelling.  Gastrointestinal:  Negative for abdominal pain, blood in stool, constipation, diarrhea and nausea.  Endocrine: Negative for polydipsia, polyphagia and polyuria.  Genitourinary:  Negative for dysuria, frequency, hematuria and urgency.  Musculoskeletal:  Negative for back pain, myalgias and neck pain.  Skin:  Negative for rash.  Allergic/Immunologic: Negative for environmental allergies.   Neurological:  Negative for dizziness, weakness and headaches.  Hematological:  Does not bruise/bleed easily.  Psychiatric/Behavioral:  Negative for suicidal ideas. The patient is not nervous/anxious.    Patient Active Problem List   Diagnosis Date Noted   Encounter for screening colonoscopy    Gastroesophageal reflux disease 12/05/2017   Incarcerated umbilical hernia    Umbilical hernia 50/93/2671   Diastasis recti 10/08/2015   Essential hypertension 04/21/2015   Hyperlipidemia 04/21/2015    No Known Allergies  Past Surgical History:  Procedure Laterality Date   COLONOSCOPY WITH PROPOFOL N/A 12/16/2018   Procedure: COLONOSCOPY WITH PROPOFOL;  Surgeon: Lucilla Lame, MD;  Location: [facility];  Service: Endoscopy;  Laterality: N/A;  Diabetic - insulin and oral meds   KIDNEY STONE SURGERY     LITHOTRIPSY     UMBILICAL HERNIA REPAIR N/A 12/20/2015   Procedure: HERNIA REPAIR UMBILICAL ADULT;  Surgeon: Hubbard Robinson, MD;  Location: [facility];  Service: General;  Laterality: N/A;    Social History   Tobacco Use   Smoking status: Never   Smokeless tobacco: Never  Vaping Use   Vaping Use: Never used  Substance Use Topics   Alcohol use: Yes    Alcohol/week: 0.0 standard drinks    Comment: rare   Drug use: No     Medication list has been reviewed and updated.  Current Meds  Medication Sig   aspirin 81 MG tablet Take 1 tablet (81 mg total) by mouth daily.   empagliflozin (JARDIANCE) 10 MG TABS tablet TAKE (1) TABLET BY MOUTH EVERY DAY   GLOBAL EASE INJECT PEN NEEDLES 31G X 5 MM MISC USE 1 PENTIP DAILY WITH INSULIN   GLOBAL EASE INJECT PEN NEEDLES 31G X 8 MM MISC USE 1 PENTIP DAILY WITH INSULIN   insulin glargine (LANTUS SOLOSTAR) 100 UNIT/ML Solostar Pen INJECT 44 UNITS INTO THE SKIN AT BEDTIME.   lisinopril (ZESTRIL) 5 MG tablet TAKE (1) TABLET BY MOUTH EVERY MORNING   metFORMIN (GLUCOPHAGE) 500 MG tablet TAKE (1) TABLET BY MOUTH TWICE DAILY   pantoprazole  (PROTONIX) 40 MG tablet TAKE ONE (1) TABLET BY MOUTH ONCE DAILY   primidone (MYSOLINE) 50 MG tablet Take 50 mg by mouth. 12 tablets a day/ [facility]- neuro   tamsulosin (FLOMAX) 0.4 MG CAPS capsule TAKE (1) CAPSULE BY MOUTH EVERY DAY    PHQ 2/9 Scores 04/20/2021 03/03/2021 11/17/2020 08/03/2020  PHQ - 2 Score 1 0 0 0  PHQ- 9 Score 9 0 0 1    GAD 7 : Generalized Anxiety Score 04/20/2021 03/03/2021 11/17/2020 08/03/2020  Nervous, Anxious, on Edge 0 0 0 0  Control/stop worrying 0 0 0 0  Worry too much - different things 0 0 0 0  Trouble relaxing 0 0 0 0  Restless 0 0 0 0  Easily annoyed or irritable 0 0 0 0  Afraid - awful might happen 0 0 0 0  Total GAD 7 Score 0 0 0 0  Anxiety Difficulty - - - -    BP Readings from Last 3 Encounters:  04/20/21 100/60  03/03/21 120/80  11/17/20 100/60    Physical Exam Vitals and nursing note reviewed.  HENT:     Head: Normocephalic.     Right Ear: Tympanic membrane and external ear normal.  Left Ear: Tympanic membrane and external ear normal.     Nose: Nose normal.  Eyes:     General: No scleral icterus.       Right eye: No discharge.        Left eye: No discharge.     Conjunctiva/sclera: Conjunctivae normal.     Pupils: Pupils are equal, round, and reactive to light.  Neck:     Thyroid: No thyromegaly.     Vascular: No JVD.     Trachea: No tracheal deviation.  Cardiovascular:     Rate and Rhythm: Normal rate and regular rhythm.     Heart sounds: Normal heart sounds. No murmur heard.   No friction rub. No gallop.  Pulmonary:     Effort: No respiratory distress.     Breath sounds: Normal breath sounds. No wheezing or rales.  Abdominal:     General: Bowel sounds are normal.     Palpations: Abdomen is soft. There is no mass.     Tenderness: There is no abdominal tenderness. There is no guarding or rebound.  Musculoskeletal:        General: No tenderness. Normal range of motion.     Cervical back: Normal range of motion and neck supple.   Lymphadenopathy:     Cervical: No cervical adenopathy.  Skin:    General: Skin is warm.     Findings: No rash.  Neurological:     Mental Status: He is alert and oriented to person, place, and time.     Cranial Nerves: No cranial nerve deficit.     Deep Tendon Reflexes: Reflexes are normal and symmetric.    Wt Readings from Last 3 Encounters:  04/20/21 182 lb (82.6 kg)  03/03/21 188 lb (85.3 kg)  11/17/20 185 lb (83.9 kg)    BP 100/60   Pulse 98   Ht 5\' 6"  (1.676 m)   Wt 182 lb (82.6 kg)   SpO2 98%   BMI 29.38 kg/m   Assessment and Plan:  1. Pharyngitis, unspecified etiology New onset.  Patient is negative for COVID and influenza.  Exam is consistent with mild erythema of the throat.  Because patient has had persistent hiccups for 24 to 48 hours we will treat with azithromycin to 50 mg 2 today followed by 1 a day for 4 days if there is an infectious etiology such as strep. - POCT Influenza A/B - azithromycin (ZITHROMAX) 250 MG tablet; Take 2 tablets on day 1, then 1 tablet daily on days 2 through 5  Dispense: 6 tablet; Refill: 0  2. Hiccups As noted patient has had pickups which are uncontrollable for 24 to 48 hours.  These also occur at night while asleep.  A trial of gabapentin 100 mg 3 times a day will be in effect and if unresolved her neck step will be further evaluation for perhaps with baclofen or Thorazine.  In addition patient will continue current GERD therapy with pantoprazole 40 mg once a day. - gabapentin (NEURONTIN) 100 MG capsule; Take 1 capsule (100 mg total) by mouth 3 (three) times daily.  Dispense: 30 capsule; Refill: 0  3. Type 2 diabetes mellitus without complication, without long-term current use of insulin (HCC) Chronic.  Controlled.  Stable.  Patient is on triple therapy of Jardiance, metformin, and insulin.  Most recent fasting blood sugars are in the 1 10-1 30 range.  Patient is tolerating this from well and we will follow-up with renal function panel  lipid panel and A1c. -  Renal Function Panel - Lipid Panel With LDL/HDL Ratio - HgB A1c  4. Essential hypertension Chronic.  Controlled.  Stable.  Blood pressure is 100/60.  We will check renal function panel at this time and continue with current regimen of lisinopril 5 mg once a day.. - Renal Function Panel   Patient is under a lot of undue stress due to recent illness of her friend and upcoming surgery and persistence of chronic neck pain and overall stress of business.  This may be contributing to the patient's concerns and at this time we will follow without medication because patient is reluctant to take any medication and has actually probably denied a lot of the questions that we have asked.

## 2021-04-20 NOTE — Patient Instructions (Signed)
Hiccups A hiccup is the result of a sudden irritation of a muscle that is used for breathing (diaphragm). The diaphragm is located under your lungs and above your stomach. When the diaphragm gets irritated, it may quickly tighten without your control (have a spasm). The spasm causes you to quickly suck in air, and that causes your vocal cords to close together quickly. These reactions cause the hiccup sound. Hiccups usually last only a short amount of time (less than 48 hours). In unusual cases, they can last for days or months and require you to see your health care provider. Common causes of hiccups include: Eating too fast or eating too much food. Drinking alcohol or bubbly (carbonated) drinks. Eating or drinking hot or spicy foods and drinks. Swallowing extra air when sucking on candy or a straw or when chewing on gum. Feeling nervous, stressed, or excited. Having certain conditions that irritate the diaphragm nerves. Having metabolic or nervous system disorders. Follow these instructions at home: To prevent hiccups or lessen discomfort from hiccups: Eat and chew your food slowly. Eat small meals, and avoid overeating. If you drink alcohol: Limit how much you have to: 0-1 drink a day for women who are not pregnant. 0-2 drinks a day for men. Know how much alcohol is in a drink. In the [place] one drink equals one 12 oz bottle of beer (355 mL), one 5 oz glass of wine (148 mL), or one 1 oz glass of hard liquor (44 mL). Limit your drinking of carbonated or fizzy drinks, such as soda. Avoid eating or drinking hot or spicy foods and drinks. General instructions Watch for any changes in your hiccups. Take over-the-counter and prescription medicines only as told by your health care provider. Contact a health care provider if: Your hiccups last for more than 48 hours. Your hiccups do not improve with treatment. You cannot sleep or eat because of your hiccups. You have unexpected weight loss  because of your hiccups. You have numbness, tingling, or weakness. Get help right away if: You have trouble breathing or swallowing. You have severe pain in your abdomen. These symptoms may represent a serious problem that is an emergency. Do not wait to see if the symptoms will go away. Get medical help right away. Call your local emergency services (911 in the [place] Do not drive yourself to the hospital. Summary A hiccup is the result of a sudden irritation of a muscle that is used for breathing (diaphragm). Hiccups can be caused by many things, including eating too fast. Call your health care provider if your hiccups last for more than 48 hours. This information is not intended to replace advice given to you by your health care provider. Make sure you discuss any questions you have with your health care provider. Document Revised: 01/30/2020 Document Reviewed: 01/30/2020 Elsevier Patient Education  [place].

## 2021-04-21 ENCOUNTER — Ambulatory Visit: Payer: Self-pay | Admitting: *Deleted

## 2021-04-21 ENCOUNTER — Other Ambulatory Visit: Payer: Self-pay

## 2021-04-21 DIAGNOSIS — R066 Hiccough: Secondary | ICD-10-CM

## 2021-04-21 LAB — LIPID PANEL WITH LDL/HDL RATIO
Cholesterol, Total: 152 mg/dL (ref 100–199)
HDL: 37 mg/dL — ABNORMAL LOW (ref >39)
LDL Chol Calc (NIH): 94 mg/dL (ref 0–99)
LDL/HDL Ratio: 2.5 ratio (ref 0.0–3.6)
Triglycerides: 114 mg/dL (ref 0–149)
VLDL Cholesterol Cal: 21 mg/dL (ref 5–40)

## 2021-04-21 LAB — RENAL FUNCTION PANEL
Albumin: 5 g/dL — ABNORMAL HIGH (ref 3.8–4.9)
BUN/Creatinine Ratio: 17 (ref 9–20)
BUN: 15 mg/dL (ref 6–24)
CO2: 22 mmol/L (ref 20–29)
Calcium: 9.7 mg/dL (ref 8.7–10.2)
Chloride: 97 mmol/L (ref 96–106)
Creatinine, Ser: 0.88 mg/dL (ref 0.76–1.27)
Glucose: 92 mg/dL (ref 70–99)
Phosphorus: 3.5 mg/dL (ref 2.8–4.1)
Potassium: 4.2 mmol/L (ref 3.5–5.2)
Sodium: 134 mmol/L (ref 134–144)
eGFR: 102 mL/min/{1.73_m2} (ref >59)

## 2021-04-21 LAB — HEMOGLOBIN A1C
Est. average glucose Bld gHb Est-mCnc: 140 mg/dL
Hgb A1c MFr Bld: 6.5 % — ABNORMAL HIGH (ref 4.8–5.6)

## 2021-04-21 NOTE — Progress Notes (Signed)
Placed referral  

## 2021-04-21 NOTE — Telephone Encounter (Signed)
Calls with worsening sore throat, fatigue and constant nausea and indigestion today regardless of meals. Sipping on fluids.Onset of diarrhea overnight. Seen by pcp yesterday. Today is day 2 of Zithromax. No fever/SOB/dizziness/sweating. Feels much worse than yesterday. Negative Covid results 2 days ago, not tested since.   Patient sounds very sick. Advising ED at this time. Patient agrees. Daughter will drive.  Reason for Disposition  Patient sounds very sick or weak to the triager  Answer Assessment - Initial Assessment Questions 1. ONSET: "When did the throat start hurting?" (Hours or days ago)      2 days ago 2. SEVERITY: "How bad is the sore throat?" (Scale 1-10; mild, moderate or severe)   - MILD (1-3):  doesn't interfere with eating or normal activities   - MODERATE (4-7): interferes with eating some solids and normal activities   - SEVERE (8-10):  excruciating pain, interferes with most normal activities   - SEVERE DYSPHAGIA: can't swallow liquids, drooling     Today, severe 3. STREP EXPOSURE: "Has there been any exposure to strep within the past week?" If Yes, ask: "What type of contact occurred?"      no 4.  VIRAL SYMPTOMS: "Are there any symptoms of a cold, such as a runny nose, cough, hoarse voice or red eyes?"      Fatigue, hiccups, indigestion 5. FEVER: "Do you have a fever?" If Yes, ask: "What is your temperature, how was it measured, and when did it start?"     no 6. PUS ON THE TONSILS: "Is there pus on the tonsils in the back of your throat?"     Does not see any. 7. OTHER SYMPTOMS: "Do you have any other symptoms?" (e.g., difficulty breathing, headache, rash)     Denies. HA last night 8. PREGNANCY: "Is there any chance you are pregnant?" "When was your last menstrual period?"     na  Protocols used: Sore Throat-A-AH

## 2021-04-25 ENCOUNTER — Encounter: Payer: Self-pay | Admitting: Family Medicine

## 2021-04-25 ENCOUNTER — Other Ambulatory Visit: Payer: Self-pay

## 2021-04-25 ENCOUNTER — Ambulatory Visit (INDEPENDENT_AMBULATORY_CARE_PROVIDER_SITE_OTHER): Payer: 59 | Admitting: Family Medicine

## 2021-04-25 VITALS — BP 110/76 | HR 82 | Resp 16 | Ht 66.0 in | Wt 177.0 lb

## 2021-04-25 DIAGNOSIS — J029 Acute pharyngitis, unspecified: Secondary | ICD-10-CM | POA: Diagnosis not present

## 2021-04-25 MED ORDER — PREDNISONE 10 MG PO TABS: 10.0000 mg | ORAL_TABLET | Freq: Every day | ORAL | 0 refills | Status: DC

## 2021-04-25 MED ORDER — VALACYCLOVIR HCL 500 MG PO TABS: 500.0000 mg | ORAL_TABLET | Freq: Two times a day (BID) | ORAL | 0 refills | Status: DC

## 2021-04-25 NOTE — Progress Notes (Signed)
Date:  04/25/2021   Name:  Kevin Montgomery   DOB:  Jul 30, 1965   MRN:  [id number]   Chief Complaint: Sore Throat (He is back to be evaluated for pain in his throat. He was evaluated last week and then again at ED. He is unable to eat, has hoarseness and fatigue. His pain is 7/8.)  Sore Throat  This is a new problem. The current episode started 1 to 4 weeks ago (10 days). The problem has been gradually worsening. Neither side of throat is experiencing more pain than the other. There has been no fever. The pain is at a severity of 7/10. The pain is moderate. Associated symptoms include diarrhea, a hoarse voice and trouble swallowing. Pertinent negatives include no abdominal pain, congestion, coughing, drooling, ear discharge, ear pain, headaches, neck pain, shortness of breath, stridor or swollen glands. He has had no exposure to strep or mono. He has tried nothing for the symptoms. The treatment provided moderate relief.   Lab Results  Component Value Date   CREATININE 0.88 04/20/2021   BUN 15 04/20/2021   NA 134 04/20/2021   K 4.2 04/20/2021   CL 97 04/20/2021   CO2 22 04/20/2021   Lab Results  Component Value Date   CHOL 152 04/20/2021   HDL 37 (L) 04/20/2021   LDLCALC 94 04/20/2021   TRIG 114 04/20/2021   CHOLHDL 4.1 01/27/2020   No results found for: TSH Lab Results  Component Value Date   HGBA1C 6.5 (H) 04/20/2021   Lab Results  Component Value Date   WBC 5.7 09/21/2020   HGB 15.5 09/21/2020   HCT 42.0 09/21/2020   MCV 89.2 09/21/2020   PLT 158 09/21/2020   Lab Results  Component Value Date   ALT 50 (H) 01/24/2019   AST 29 01/24/2019   ALKPHOS 49 01/24/2019   BILITOT 1.7 (H) 01/24/2019     Review of Systems  Constitutional:  Negative for chills and fever.  HENT:  Positive for hoarse voice and trouble swallowing. Negative for congestion, drooling, ear discharge, ear pain and sore throat.   Respiratory:  Negative for cough, shortness of breath, wheezing and  stridor.   Cardiovascular:  Negative for chest pain, palpitations and leg swelling.  Gastrointestinal:  Positive for diarrhea. Negative for abdominal pain, blood in stool, constipation and nausea.  Endocrine: Negative for polydipsia.  Genitourinary:  Negative for dysuria, frequency, hematuria and urgency.  Musculoskeletal:  Negative for back pain, myalgias and neck pain.  Skin:  Negative for rash.  Allergic/Immunologic: Negative for environmental allergies.  Neurological:  Negative for dizziness and headaches.  Hematological:  Does not bruise/bleed easily.  Psychiatric/Behavioral:  Negative for suicidal ideas. The patient is not nervous/anxious.    Patient Active Problem List   Diagnosis Date Noted   Encounter for screening colonoscopy    Gastroesophageal reflux disease 12/05/2017   Incarcerated umbilical hernia    Umbilical hernia 74/25/9563   Diastasis recti 10/08/2015   Essential hypertension 04/21/2015   Hyperlipidemia 04/21/2015    No Known Allergies  Past Surgical History:  Procedure Laterality Date   COLONOSCOPY WITH PROPOFOL N/A 12/16/2018   Procedure: COLONOSCOPY WITH PROPOFOL;  Surgeon: Lucilla Lame, MD;  Location: [facility];  Service: Endoscopy;  Laterality: N/A;  Diabetic - insulin and oral meds   KIDNEY STONE SURGERY     LITHOTRIPSY     UMBILICAL HERNIA REPAIR N/A 12/20/2015   Procedure: HERNIA REPAIR UMBILICAL ADULT;  Surgeon: Hubbard Robinson, MD;  Location: [facility];  Service: General;  Laterality: N/A;    Social History   Tobacco Use   Smoking status: Never   Smokeless tobacco: Never  Vaping Use   Vaping Use: Never used  Substance Use Topics   Alcohol use: Yes    Alcohol/week: 0.0 standard drinks    Comment: rare   Drug use: No     Medication list has been reviewed and updated.  Current Meds  Medication Sig   aspirin 81 MG tablet Take 1 tablet (81 mg total) by mouth daily.   empagliflozin (JARDIANCE) 10 MG TABS tablet TAKE (1) TABLET BY  MOUTH EVERY DAY   gabapentin (NEURONTIN) 100 MG capsule Take 1 capsule (100 mg total) by mouth 3 (three) times daily.   GLOBAL EASE INJECT PEN NEEDLES 31G X 5 MM MISC USE 1 PENTIP DAILY WITH INSULIN   GLOBAL EASE INJECT PEN NEEDLES 31G X 8 MM MISC USE 1 PENTIP DAILY WITH INSULIN   insulin glargine (LANTUS SOLOSTAR) 100 UNIT/ML Solostar Pen INJECT 44 UNITS INTO THE SKIN AT BEDTIME.   lisinopril (ZESTRIL) 5 MG tablet TAKE (1) TABLET BY MOUTH EVERY MORNING   metFORMIN (GLUCOPHAGE) 500 MG tablet TAKE (1) TABLET BY MOUTH TWICE DAILY   pantoprazole (PROTONIX) 40 MG tablet TAKE ONE (1) TABLET BY MOUTH ONCE DAILY   primidone (MYSOLINE) 50 MG tablet Take 50 mg by mouth. 12 tablets a day/ [facility]- neuro   tamsulosin (FLOMAX) 0.4 MG CAPS capsule TAKE (1) CAPSULE BY MOUTH EVERY DAY    PHQ 2/9 Scores 04/25/2021 04/20/2021 03/03/2021 11/17/2020  PHQ - 2 Score 0 1 0 0  PHQ- 9 Score 9 9 0 0    GAD 7 : Generalized Anxiety Score 04/25/2021 04/20/2021 03/03/2021 11/17/2020  Nervous, Anxious, on Edge 0 0 0 0  Control/stop worrying 0 0 0 0  Worry too much - different things 0 0 0 0  Trouble relaxing 3 0 0 0  Restless 3 0 0 0  Easily annoyed or irritable 0 0 0 0  Afraid - awful might happen 0 0 0 0  Total GAD 7 Score 6 0 0 0  Anxiety Difficulty - - - -    BP Readings from Last 3 Encounters:  04/25/21 110/76  04/20/21 100/60  03/03/21 120/80    Physical Exam Vitals and nursing note reviewed.  HENT:     Head: Normocephalic.     Jaw: There is normal jaw occlusion. No trismus.     Right Ear: Ear canal and external ear normal. Tympanic membrane is retracted.     Left Ear: Tympanic membrane, ear canal and external ear normal.     Nose: Nose normal.     Mouth/Throat:     Dentition: Normal dentition.     Tongue: No lesions.     Palate: No mass and lesions.     Pharynx: Posterior oropharyngeal erythema present. No pharyngeal swelling, oropharyngeal exudate or uvula swelling.     Tonsils: No tonsillar exudate  or tonsillar abscesses.     Comments: Minimal swelling Eyes:     General: No scleral icterus.       Right eye: No discharge.        Left eye: No discharge.     Conjunctiva/sclera: Conjunctivae normal.     Pupils: Pupils are equal, round, and reactive to light.  Neck:     Thyroid: No thyroid mass, thyromegaly or thyroid tenderness.     Vascular: No JVD.     Trachea:  No tracheal deviation.  Cardiovascular:     Rate and Rhythm: Normal rate and regular rhythm.     Heart sounds: Normal heart sounds. No murmur heard.   No friction rub. No gallop.  Pulmonary:     Effort: No respiratory distress.     Breath sounds: Normal breath sounds. No wheezing or rales.  Abdominal:     General: Bowel sounds are normal.     Palpations: Abdomen is soft. There is no mass.     Tenderness: There is no abdominal tenderness. There is no guarding or rebound.  Musculoskeletal:        General: No tenderness. Normal range of motion.     Cervical back: Normal range of motion and neck supple.  Lymphadenopathy:     Cervical: No cervical adenopathy.     Right cervical: No superficial, deep or posterior cervical adenopathy.    Left cervical: No superficial, deep or posterior cervical adenopathy.  Skin:    General: Skin is warm.     Findings: No rash.  Neurological:     Mental Status: He is alert and oriented to person, place, and time.     Cranial Nerves: No cranial nerve deficit.     Deep Tendon Reflexes: Reflexes are normal and symmetric.    Wt Readings from Last 3 Encounters:  04/25/21 177 lb (80.3 kg)  04/20/21 182 lb (82.6 kg)  03/03/21 188 lb (85.3 kg)    BP 110/76   Pulse 82   Resp 16   Ht 5\' 6"  (1.676 m)   Wt 177 lb (80.3 kg)   SpO2 99%   BMI 28.57 kg/m   Assessment and Plan:  1. Pharyngitis, unspecified etiology Acute.  Persistent.  Stable.  But patient is having increasing difficulty in swallowing particularly food and there is some distortion of taste.  For the most part exam is  unremarkable with some mild erythema there is no adenopathy no exudate and certainly no peritonsillar swelling.  There is no tenesmus.  We will proceed with a monotest and the following prednisone taper [id number], valacyclovir 500 mg twice a day for 5 days and dexamethasone mouthwash with lidocaine swish gargle and expectorate 3-4 times a day.  Patient is encouraged with fluids that are high including milkshakes that have light high calories and my favorite frozen margaritas. - predniSONE (DELTASONE) 10 MG tablet; Take 1 tablet (10 mg total) by mouth daily with breakfast. Taper 4,4,4,3,3,3,2,2,2,1,1,1  Dispense: 30 tablet; Refill: 0 - Monospot

## 2021-04-26 LAB — MONONUCLEOSIS SCREEN: Mono Screen: NEGATIVE

## 2021-04-27 ENCOUNTER — Other Ambulatory Visit: Payer: Self-pay

## 2021-04-27 ENCOUNTER — Ambulatory Visit (INDEPENDENT_AMBULATORY_CARE_PROVIDER_SITE_OTHER): Payer: 59 | Admitting: Family Medicine

## 2021-04-27 ENCOUNTER — Encounter: Payer: Self-pay | Admitting: Family Medicine

## 2021-04-27 DIAGNOSIS — R1310 Dysphagia, unspecified: Secondary | ICD-10-CM | POA: Diagnosis not present

## 2021-04-27 LAB — POCT RAPID STREP A (OFFICE): Rapid Strep A Screen: NEGATIVE

## 2021-04-27 NOTE — Progress Notes (Signed)
Date:  04/27/2021   Name:  Kevin Montgomery   DOB:  September 11, 1965   MRN:  [id number]   Chief Complaint: No chief complaint on file.  Sore Throat  This is a new problem. The current episode started in the past 7 days. The problem has been unchanged. There has been no fever. The pain is moderate. Associated symptoms include diarrhea and trouble swallowing. Pertinent negatives include no abdominal pain, congestion, drooling, headaches, neck pain, shortness of breath, stridor or swollen glands. He has had no exposure to strep or mono.   Lab Results  Component Value Date   CREATININE 0.88 04/20/2021   BUN 15 04/20/2021   NA 134 04/20/2021   K 4.2 04/20/2021   CL 97 04/20/2021   CO2 22 04/20/2021   Lab Results  Component Value Date   CHOL 152 04/20/2021   HDL 37 (L) 04/20/2021   LDLCALC 94 04/20/2021   TRIG 114 04/20/2021   CHOLHDL 4.1 01/27/2020   No results found for: TSH Lab Results  Component Value Date   HGBA1C 6.5 (H) 04/20/2021   Lab Results  Component Value Date   WBC 5.7 09/21/2020   HGB 15.5 09/21/2020   HCT 42.0 09/21/2020   MCV 89.2 09/21/2020   PLT 158 09/21/2020   Lab Results  Component Value Date   ALT 50 (H) 01/24/2019   AST 29 01/24/2019   ALKPHOS 49 01/24/2019   BILITOT 1.7 (H) 01/24/2019     Review of Systems  HENT:  Positive for trouble swallowing. Negative for congestion and drooling.   Respiratory:  Negative for shortness of breath and stridor.   Gastrointestinal:  Positive for diarrhea. Negative for abdominal pain.  Musculoskeletal:  Negative for neck pain.  Neurological:  Negative for headaches.   Patient Active Problem List   Diagnosis Date Noted   Encounter for screening colonoscopy    Gastroesophageal reflux disease 12/05/2017   Incarcerated umbilical hernia    Umbilical hernia 30/02/2329   Diastasis recti 10/08/2015   Essential hypertension 04/21/2015   Hyperlipidemia 04/21/2015    No Known Allergies  Past Surgical History:   Procedure Laterality Date   COLONOSCOPY WITH PROPOFOL N/A 12/16/2018   Procedure: COLONOSCOPY WITH PROPOFOL;  Surgeon: Lucilla Lame, MD;  Location: [place];  Service: Endoscopy;  Laterality: N/A;  Diabetic - insulin and oral meds   KIDNEY STONE SURGERY     LITHOTRIPSY     UMBILICAL HERNIA REPAIR N/A 12/20/2015   Procedure: HERNIA REPAIR UMBILICAL ADULT;  Surgeon: Hubbard Robinson, MD;  Location: [facility];  Service: General;  Laterality: N/A;    Social History   Tobacco Use   Smoking status: Never   Smokeless tobacco: Never  Vaping Use   Vaping Use: Never used  Substance Use Topics   Alcohol use: Yes    Alcohol/week: 0.0 standard drinks    Comment: rare   Drug use: No     Medication list has been reviewed and updated.  No outpatient medications have been marked as taking for the 04/27/21 encounter (Appointment) with Juline Patch, MD.    [facility] 2/9 Scores 04/25/2021 04/20/2021 03/03/2021 11/17/2020  PHQ - 2 Score 0 1 0 0  PHQ- 9 Score 9 9 0 0    GAD 7 : Generalized Anxiety Score 04/25/2021 04/20/2021 03/03/2021 11/17/2020  Nervous, Anxious, on Edge 0 0 0 0  Control/stop worrying 0 0 0 0  Worry too much - different things 0 0 0 0  Trouble  relaxing 3 0 0 0  Restless 3 0 0 0  Easily annoyed or irritable 0 0 0 0  Afraid - awful might happen 0 0 0 0  Total GAD 7 Score 6 0 0 0  Anxiety Difficulty - - - -    BP Readings from Last 3 Encounters:  04/25/21 110/76  04/20/21 100/60  03/03/21 120/80    Physical Exam HENT:     Right Ear: Tympanic membrane normal.     Left Ear: Tympanic membrane normal.     Mouth/Throat:     Mouth: Mucous membranes are moist.     Tongue: No lesions.     Palate: No mass.     Pharynx: Oropharynx is clear. Uvula midline. Posterior oropharyngeal erythema present. No pharyngeal swelling, oropharyngeal exudate or uvula swelling.     Tonsils: No tonsillar exudate or tonsillar abscesses.  Neck:     Thyroid: No thyroid mass, thyromegaly or  thyroid tenderness.     Trachea: Trachea and phonation normal.     Comments: No palpable mass of the anterior neck no adenopathy. Musculoskeletal:     Cervical back: Normal range of motion and neck supple. No rigidity.  Lymphadenopathy:     Cervical: No cervical adenopathy.     Right cervical: No superficial, deep or posterior cervical adenopathy.    Left cervical: No superficial, deep or posterior cervical adenopathy.    Wt Readings from Last 3 Encounters:  04/25/21 177 lb (80.3 kg)  04/20/21 182 lb (82.6 kg)  03/03/21 188 lb (85.3 kg)    There were no vitals taken for this visit.  Assessment and Plan: 1. Dysphagia, unspecified type In retrospect I am beginning to think that sore throat is actually meaning dysphagia because patient recently has been unable to swallow even mashed potatoes or liquids.  I am beginning to think that this is more of a GI concern than a pharyngeal concern and that there may be an oropharyngeal concern or a proximal esophageal stricture.  Patient's had no previous issues with swallowing meats, bread, or other solid foods.  Patient's has been suggested that he proceed to hospital setting whereas he can be evaluated either by ENT or GI for either an esophageal stricture or oropharyngeal concern. - POCT rapid strep A

## 2021-05-19 ENCOUNTER — Other Ambulatory Visit: Payer: Self-pay | Admitting: Family Medicine

## 2021-05-19 ENCOUNTER — Other Ambulatory Visit: Payer: Self-pay

## 2021-05-19 ENCOUNTER — Telehealth: Payer: Self-pay | Admitting: Family Medicine

## 2021-05-19 DIAGNOSIS — N401 Enlarged prostate with lower urinary tract symptoms: Secondary | ICD-10-CM

## 2021-05-19 NOTE — Telephone Encounter (Signed)
Copied from [place] [id number]. Topic: Referral - Status >> May 19, 2021  2:34 PM Erick Blinks wrote: Reason for CRM: Pt called to report that the current referral for GI is OON. Please advise, needs new location in network.

## 2021-05-23 ENCOUNTER — Ambulatory Visit: Payer: Self-pay | Admitting: *Deleted

## 2021-05-23 NOTE — Telephone Encounter (Signed)
  Chief Complaint: Covid positive Sunday Symptoms: Cough, sinus pain, diarrhea, subjective fever with chills, sweating. Frequency: Symptoms onset SAturday night. Pertinent Negatives: Patient denies SOB at rest. SOB with coughing and  because"Nose stuffy." No sore throat  Disposition: [] ED /[] Urgent Care (no appt availability in office) / [] Appointment(In office/virtual)/ []  [facility]/ [x] Home Care/ [] Refused Recommended Disposition  Additional Notes: REports sinus pain and stuffiness, diarrhea worse symptoms. Diarrhea 8 times wihtin 24 hr period. States is staying hydrated. Subjective fever, chills, sweating. HOme care advise given as well as guidelines for self isolation. Pt is interested in oral anti viral. Assured pt NT would route to PCP for review and final disposition.  Please adcvise: CB# [phone]  Dtr also in household with covid. Triaged also.

## 2021-05-23 NOTE — Telephone Encounter (Signed)
Called pt scheduled an appt for tomorrow 05/24/2021.  KP

## 2021-05-23 NOTE — Telephone Encounter (Signed)
Reason for Disposition  [1] HIGH RISK for severe COVID complications (e.g., weak immune system, age > 33 years, obesity with BMI > 25, pregnant, chronic lung disease or other chronic medical condition) AND [2] COVID symptoms (e.g., cough, fever)  (Exceptions: Already seen by PCP and no new or worsening symptoms.)  Answer Assessment - Initial Assessment Questions 1. COVID-19 DIAGNOSIS: "Who made your COVID-19 diagnosis?" "Was it confirmed by a positive lab test or self-test?" If not diagnosed by a doctor (or NP/PA), ask "Are there lots of cases (community spread) where you live?" Note: See public health department website, if unsure.     Home test Sunday 2. COVID-19 EXPOSURE: "Was there any known exposure to COVID before the symptoms began?" CDC Definition of close contact: within 6 feet (2 meters) for a total of 15 minutes or more over a 24-hour period.      Yes, employees 3. ONSET: "When did the COVID-19 symptoms start?"      Saturday night 4. WORST SYMPTOM: "What is your worst symptom?" (e.g., cough, fever, shortness of breath, muscle aches)     Headache, sinus pain and pressure. 5. COUGH: "Do you have a cough?" If Yes, ask: "How bad is the cough?"       Cough, sinus\' bad. Unsure of color 6. FEVER: "Do you have a fever?" If Yes, ask: "What is your temperature, how was it measured, and when did it start?"     Chills and sweating.  7. RESPIRATORY STATUS: "Describe your breathing?" (e.g., shortness of breath, wheezing, unable to speak)      Unable to breath, stuffy nose, with coughing 8. BETTER-SAME-WORSE: "Are you getting better, staying the same or getting worse compared to yesterday?"  If getting worse, ask, "In what way?"     Worse 9. HIGH RISK DISEASE: "Do you have any chronic medical problems?" (e.g., asthma, heart or lung disease, weak immune system, obesity, etc.)      10 . VACCINE: "Have you had the COVID-19 vaccine?" If Yes, ask: "Which one, how many shots, when did you get it?"        2, 2020 11. BOOSTER: "Have you received your COVID-19 booster?" If Yes, ask: "Which one and when did you get it?"       no  13. OTHER SYMPTOMS: "Do you have any other symptoms?"  (e.g., chills, fatigue, headache, loss of smell or taste, muscle pain, sore throat)       Headache, diarrhea, 8 times within 24 hrs. Loose and watery. 14. O2 SATURATION MONITOR:  "Do you use an oxygen saturation monitor (pulse oximeter) at home?" If Yes, ask "What is your reading (oxygen level) today?" "What is your usual oxygen saturation reading?" (e.g., 95%)       NA  Protocols used: Coronavirus (COVID-19) Diagnosed or Suspected-A-AH

## 2021-05-24 ENCOUNTER — Ambulatory Visit (INDEPENDENT_AMBULATORY_CARE_PROVIDER_SITE_OTHER): Payer: 59 | Admitting: Family Medicine

## 2021-05-24 ENCOUNTER — Encounter: Payer: Self-pay | Admitting: Family Medicine

## 2021-05-24 ENCOUNTER — Other Ambulatory Visit: Payer: Self-pay

## 2021-05-24 VITALS — Temp 98.6°F

## 2021-05-24 DIAGNOSIS — R051 Acute cough: Secondary | ICD-10-CM

## 2021-05-24 DIAGNOSIS — U071 COVID-19: Secondary | ICD-10-CM

## 2021-05-24 MED ORDER — MOLNUPIRAVIR EUA 200MG CAPSULE: 4.0000 | ORAL_CAPSULE | Freq: Two times a day (BID) | ORAL | 0 refills | Status: AC

## 2021-05-24 MED ORDER — GUAIFENESIN-CODEINE 100-10 MG/5ML PO SYRP: 5.0000 mL | ORAL_SOLUTION | Freq: Three times a day (TID) | ORAL | 0 refills | Status: DC | PRN

## 2021-05-24 NOTE — Patient Instructions (Signed)

## 2021-05-24 NOTE — Progress Notes (Addendum)
Date:  05/24/2021   Name:  Kevin Montgomery   DOB:  01/03/1966   MRN:  [id number]   Chief Complaint: Covid Positive (Started with symptoms Saturday night, tested positive Sun am. Cough, headache, runny nose, diarrhea)  I Army Fossa, MD from my office connected with this patient, Kevin Montgomery age 55, by telephone at the patient's home.  I verified that I am speaking with the correct person using two identifiers. This visit was conducted via telephone due to the Covid-19 outbreak from my office at [facility] in [place], [place]. I discussed the limitations, risks, security and privacy concerns of performing an evaluation and management service by telephone. I also discussed with the patient that there may be a patient responsible charge related to this service. The patient expressed understanding and agreed to proceed.    Fever  This is a new (For COVID) problem. The current episode started in the past 7 days (48 hours). The problem occurs intermittently. The problem has been waxing and waning. Associated symptoms include congestion, coughing, diarrhea, headaches and muscle aches. Pertinent negatives include no chest pain, nausea or wheezing. He has tried acetaminophen for the symptoms.   Lab Results  Component Value Date   NA 134 04/20/2021   K 4.2 04/20/2021   CO2 22 04/20/2021   GLUCOSE 92 04/20/2021   BUN 15 04/20/2021   CREATININE 0.88 04/20/2021   CALCIUM 9.7 04/20/2021   EGFR 102 04/20/2021   GFRNONAA 105 08/03/2020   Lab Results  Component Value Date   CHOL 152 04/20/2021   HDL 37 (L) 04/20/2021   LDLCALC 94 04/20/2021   TRIG 114 04/20/2021   CHOLHDL 4.1 01/27/2020   No results found for: TSH Lab Results  Component Value Date   HGBA1C 6.5 (H) 04/20/2021   Lab Results  Component Value Date   WBC 5.7 09/21/2020   HGB 15.5 09/21/2020   HCT 42.0 09/21/2020   MCV 89.2 09/21/2020   PLT 158 09/21/2020   Lab Results  Component Value Date   ALT 50 (H)  01/24/2019   AST 29 01/24/2019   ALKPHOS 49 01/24/2019   BILITOT 1.7 (H) 01/24/2019   No results found for: 25OHVITD2, 25OHVITD3, VD25OH   Review of Systems  Constitutional:  Positive for chills, diaphoresis, fatigue and fever.  HENT:  Positive for congestion.   Respiratory:  Positive for cough. Negative for chest tightness and wheezing.   Cardiovascular:  Negative for chest pain.  Gastrointestinal:  Positive for diarrhea. Negative for nausea.  Neurological:  Positive for headaches.   Patient Active Problem List   Diagnosis Date Noted   Encounter for screening colonoscopy    Gastroesophageal reflux disease 12/05/2017   Incarcerated umbilical hernia    Umbilical hernia 35/32/9924   Diastasis recti 10/08/2015   Essential hypertension 04/21/2015   Hyperlipidemia 04/21/2015    No Known Allergies  Past Surgical History:  Procedure Laterality Date   COLONOSCOPY WITH PROPOFOL N/A 12/16/2018   Procedure: COLONOSCOPY WITH PROPOFOL;  Surgeon: Lucilla Lame, MD;  Location: [place];  Service: Endoscopy;  Laterality: N/A;  Diabetic - insulin and oral meds   KIDNEY STONE SURGERY     LITHOTRIPSY     UMBILICAL HERNIA REPAIR N/A 12/20/2015   Procedure: HERNIA REPAIR UMBILICAL ADULT;  Surgeon: Hubbard Robinson, MD;  Location: [facility];  Service: General;  Laterality: N/A;    Social History   Tobacco Use   Smoking status: Never   Smokeless tobacco: Never  Vaping Use   Vaping Use: Never used  Substance Use Topics   Alcohol use: Yes    Alcohol/week: 0.0 standard drinks    Comment: rare   Drug use: No     Medication list has been reviewed and updated.  Current Meds  Medication Sig   aspirin 81 MG tablet Take 1 tablet (81 mg total) by mouth daily.   empagliflozin (JARDIANCE) 10 MG TABS tablet TAKE (1) TABLET BY MOUTH EVERY DAY   gabapentin (NEURONTIN) 100 MG capsule Take 1 capsule (100 mg total) by mouth 3 (three) times daily.   GLOBAL EASE INJECT PEN NEEDLES 31G X 5 MM  MISC USE 1 PENTIP DAILY WITH INSULIN   GLOBAL EASE INJECT PEN NEEDLES 31G X 8 MM MISC USE 1 PENTIP DAILY WITH INSULIN   insulin glargine (LANTUS SOLOSTAR) 100 UNIT/ML Solostar Pen INJECT 44 UNITS INTO THE SKIN AT BEDTIME.   lisinopril (ZESTRIL) 5 MG tablet TAKE (1) TABLET BY MOUTH EVERY MORNING   metFORMIN (GLUCOPHAGE) 500 MG tablet TAKE (1) TABLET BY MOUTH TWICE DAILY   pantoprazole (PROTONIX) 40 MG tablet TAKE ONE (1) TABLET BY MOUTH ONCE DAILY   primidone (MYSOLINE) 50 MG tablet Take 50 mg by mouth. 12 tablets a day/ [facility]- neuro   tamsulosin (FLOMAX) 0.4 MG CAPS capsule TAKE (1) CAPSULE BY MOUTH EVERY DAY   valACYclovir (VALTREX) 500 MG tablet Take 1 tablet (500 mg total) by mouth 2 (two) times daily.    PHQ 2/9 Scores 05/24/2021 04/25/2021 04/20/2021 03/03/2021  PHQ - 2 Score 0 0 1 0  PHQ- 9 Score 0 9 9 0    GAD 7 : Generalized Anxiety Score 05/24/2021 04/25/2021 04/20/2021 03/03/2021  Nervous, Anxious, on Edge 0 0 0 0  Control/stop worrying 0 0 0 0  Worry too much - different things 0 0 0 0  Trouble relaxing 0 3 0 0  Restless 0 3 0 0  Easily annoyed or irritable 0 0 0 0  Afraid - awful might happen 0 0 0 0  Total GAD 7 Score 0 6 0 0  Anxiety Difficulty Not difficult at all - - -    BP Readings from Last 3 Encounters:  04/25/21 110/76  04/20/21 100/60  03/03/21 120/80    Physical Exam Vitals and nursing note reviewed.    Wt Readings from Last 3 Encounters:  04/25/21 177 lb (80.3 kg)  04/20/21 182 lb (82.6 kg)  03/03/21 188 lb (85.3 kg)    Temp 98.6 F (37 C) (Oral)   Assessment and Plan:  1. COVID-19 New onset.  Persistent.  Multiple symptoms per patient consistent with early COVID-19 infection.  Given risk score of 3 and patient's age we will treat with molnupiravir 200 mg 4 capsules twice a day for 5 days.  We have discussed other symptoms that may recur and patient does have some diarrhea and has been suggested that he use some Imodium and consume plenty of  fluids to compensate for loss of volume and electrolytes.  Complications have been discussed as to when patient would need to go to the hospital setting for further evaluation.  - molnupiravir EUA (LAGEVRIO) 200 mg CAPS capsule; Take 4 capsules (800 mg total) by mouth 2 (two) times daily for 5 days.  Dispense: 40 capsule; Refill: 0  2. Acute cough New onset.  Persistent.  Unable to sleep.  We will treat with Robitussin-AC teaspoon 3 times a day as needed cough. - guaiFENesin-codeine (ROBITUSSIN AC) 100-10 MG/5ML syrup; Take  5 mLs by mouth 3 (three) times daily as needed for cough.  Dispense: 118 mL; Refill: 0   I spent 10 minutes with this patient, More than 50% of that time was spent in face to face education, counseling and care coordination.

## 2021-06-27 ENCOUNTER — Ambulatory Visit (INDEPENDENT_AMBULATORY_CARE_PROVIDER_SITE_OTHER): Payer: 59 | Admitting: Gastroenterology

## 2021-06-27 ENCOUNTER — Other Ambulatory Visit: Payer: Self-pay

## 2021-06-27 ENCOUNTER — Encounter: Payer: Self-pay | Admitting: Gastroenterology

## 2021-06-27 VITALS — BP 132/84 | HR 87 | Temp 98.7°F | Ht 66.0 in | Wt 172.0 lb

## 2021-06-27 DIAGNOSIS — K219 Gastro-esophageal reflux disease without esophagitis: Secondary | ICD-10-CM | POA: Diagnosis not present

## 2021-06-27 DIAGNOSIS — R1310 Dysphagia, unspecified: Secondary | ICD-10-CM | POA: Diagnosis not present

## 2021-06-27 NOTE — Progress Notes (Signed)
Primary Care Physician: Juline Patch, MD  Primary Gastroenterologist:  Dr. Lucilla Lame  Chief Complaint  Patient presents with   New Patient (Initial Visit)    Pt reports a weird taste and intermittent problems w/ dysphagia    HPI: TERENCE BART is a 56 y.o. male here with a history of seeing me in 2020 for colonoscopy.  The patient was recently at [facility] urgent care and was there for fatigue with nausea and diarrhea.  The patient had liver enzymes checked on November 11 with an isolated increase in ALT but a few days later when his labs were checked again on November extent the liver enzymes had returned to normal.  The [facility] GI team was contacted to facilitate the patient being seen sooner but it was then determined that the patient should follow-up with me.  It all started with a sore throat. He says that the fatigue to have stopped. He says his swallowing has been better but not back to normal. He says it really the bad taste.  Past Medical History:  Diagnosis Date   Arthritis    Chronic kidney disease    h/o kidney stones   Complication of anesthesia    Diabetes mellitus without complication (HCC)    GERD (gastroesophageal reflux disease)    no meds   Hypertension    pt denies and states that Lisinopril is for kidney protection only from diabetes   PONV (postoperative nausea and vomiting)     Current Outpatient Medications  Medication Sig Dispense Refill   aspirin 81 MG tablet Take 1 tablet (81 mg total) by mouth daily. 30 tablet 11   gabapentin (NEURONTIN) 100 MG capsule Take 1 capsule (100 mg total) by mouth 3 (three) times daily. 30 capsule 0   GLOBAL EASE INJECT PEN NEEDLES 31G X 5 MM MISC USE 1 PENTIP DAILY WITH INSULIN 100 each 2   GLOBAL EASE INJECT PEN NEEDLES 31G X 8 MM MISC USE 1 PENTIP DAILY WITH INSULIN 100 each 0   guaiFENesin-codeine (ROBITUSSIN AC) 100-10 MG/5ML syrup Take 5 mLs by mouth 3 (three) times daily as needed for cough. 118 mL 0   insulin glargine  (LANTUS SOLOSTAR) 100 UNIT/ML Solostar Pen INJECT 44 UNITS INTO THE SKIN AT BEDTIME. 45 mL 1   lisinopril (ZESTRIL) 5 MG tablet TAKE (1) TABLET BY MOUTH EVERY MORNING 90 tablet 0   metFORMIN (GLUCOPHAGE) 500 MG tablet TAKE (1) TABLET BY MOUTH TWICE DAILY 180 tablet 1   pantoprazole (PROTONIX) 40 MG tablet TAKE ONE (1) TABLET BY MOUTH ONCE DAILY 90 tablet 0   primidone (MYSOLINE) 50 MG tablet Take 50 mg by mouth. 12 tablets a day/ Manski- neuro     tamsulosin (FLOMAX) 0.4 MG CAPS capsule TAKE (1) CAPSULE BY MOUTH EVERY DAY 90 capsule 0   valACYclovir (VALTREX) 500 MG tablet Take 1 tablet (500 mg total) by mouth 2 (two) times daily. 10 tablet 0   No current facility-administered medications for this visit.    Allergies as of 06/27/2021   (No Known Allergies)    ROS:  General: Negative for anorexia, weight loss, fever, chills, fatigue, weakness. ENT: Negative for hoarseness, difficulty swallowing , nasal congestion. CV: Negative for chest pain, angina, palpitations, dyspnea on exertion, peripheral edema.  Respiratory: Negative for dyspnea at rest, dyspnea on exertion, cough, sputum, wheezing.  GI: See history of present illness. GU:  Negative for dysuria, hematuria, urinary incontinence, urinary frequency, nocturnal urination.  Endo: Negative for unusual  weight change.    Physical Examination:   BP 132/84    Pulse 87    Temp 98.7 F (37.1 C) (Oral)    Ht 5\' 6"  (1.676 m)    Wt 172 lb (78 kg)    BMI 27.76 kg/m   General: Well-nourished, well-developed in no acute distress.  Eyes: No icterus. Conjunctivae pink. Lungs: Clear to auscultation bilaterally. Non-labored. Heart: Regular rate and rhythm, no murmurs rubs or gallops.  Abdomen: Bowel sounds are normal, nontender, nondistended, no hepatosplenomegaly or masses, no abdominal bruits or hernia , no rebound or guarding.   Extremities: No lower extremity edema. No clubbing or deformities. Neuro: Alert and oriented x 3.  Grossly  intact. Skin: Warm and dry, no jaundice.   Psych: Alert and cooperative, normal mood and affect.  Labs:    Imaging Studies: No results found.  Assessment and Plan:   DEVARIUS NELLES is a 55 y.o. y/o male who comes in today with a history of nausea with a severe sore throat and diarrhea with some trouble swallowing.  The patient states that most of his trouble swallowing is due to a bad taste in his mouth which has gotten better but not resolved.  The patient's diarrhea and nausea has completely resolved.  The patient has been on pantoprazole 40 mg a day and will be increased to 40 mg twice a day.  If the bad taste in his mouth is from reflux he has been told that antireflux surgery may be needed after a trial of the PPI and looking for any structural abnormalities on an EGD for which she will be set up for.  The patient eventually may need impedance studying to see how bad his reflux is and if it may be the cause of his present symptoms.  The patient has been explained the plan agrees with it.     Lucilla Lame, MD. Marval Regal    Note: This dictation was prepared with Dragon dictation along with smaller phrase technology. Any transcriptional errors that result from this process are unintentional.

## 2021-06-27 NOTE — Addendum Note (Signed)
Addended by: Lurlean Nanny on: 06/27/2021 04:27 PM   Modules accepted: Orders

## 2021-06-27 NOTE — H&P (View-Only) (Signed)
Primary Care Physician: Kevin Patch, MD  Primary Gastroenterologist:  Dr. Lucilla Montgomery  Chief Complaint  Patient presents with   New Patient (Initial Visit)    Pt reports a weird taste and intermittent problems w/ dysphagia    HPI: Kevin Montgomery is a 57 y.o. male here with a history of seeing me in 2020 for colonoscopy.  The patient was recently at [facility] urgent care and was there for fatigue with nausea and diarrhea.  The patient had liver enzymes checked on November 11 with an isolated increase in ALT but a few days later when his labs were checked again on November extent the liver enzymes had returned to normal.  The [facility] GI team was contacted to facilitate the patient being seen sooner but it was then determined that the patient should follow-up with me.  It all started with a sore throat. He says that the fatigue to have stopped. He says his swallowing has been better but not back to normal. He says it really the bad taste.  Past Medical History:  Diagnosis Date   Arthritis    Chronic kidney disease    h/o kidney stones   Complication of anesthesia    Diabetes mellitus without complication (HCC)    GERD (gastroesophageal reflux disease)    no meds   Hypertension    pt denies and states that Lisinopril is for kidney protection only from diabetes   PONV (postoperative nausea and vomiting)     Current Outpatient Medications  Medication Sig Dispense Refill   aspirin 81 MG tablet Take 1 tablet (81 mg total) by mouth daily. 30 tablet 11   gabapentin (NEURONTIN) 100 MG capsule Take 1 capsule (100 mg total) by mouth 3 (three) times daily. 30 capsule 0   GLOBAL EASE INJECT PEN NEEDLES 31G X 5 MM MISC USE 1 PENTIP DAILY WITH INSULIN 100 each 2   GLOBAL EASE INJECT PEN NEEDLES 31G X 8 MM MISC USE 1 PENTIP DAILY WITH INSULIN 100 each 0   guaiFENesin-codeine (ROBITUSSIN AC) 100-10 MG/5ML syrup Take 5 mLs by mouth 3 (three) times daily as needed for cough. 118 mL 0   insulin glargine  (LANTUS SOLOSTAR) 100 UNIT/ML Solostar Pen INJECT 44 UNITS INTO THE SKIN AT BEDTIME. 45 mL 1   lisinopril (ZESTRIL) 5 MG tablet TAKE (1) TABLET BY MOUTH EVERY MORNING 90 tablet 0   metFORMIN (GLUCOPHAGE) 500 MG tablet TAKE (1) TABLET BY MOUTH TWICE DAILY 180 tablet 1   pantoprazole (PROTONIX) 40 MG tablet TAKE ONE (1) TABLET BY MOUTH ONCE DAILY 90 tablet 0   primidone (MYSOLINE) 50 MG tablet Take 50 mg by mouth. 12 tablets a day/ [facility]ki- neuro     tamsulosin (FLOMAX) 0.4 MG CAPS capsule TAKE (1) CAPSULE BY MOUTH EVERY DAY 90 capsule 0   valACYclovir (VALTREX) 500 MG tablet Take 1 tablet (500 mg total) by mouth 2 (two) times daily. 10 tablet 0   No current facility-administered medications for this visit.    Allergies as of 06/27/2021   (No Known Allergies)    ROS:  General: Negative for anorexia, weight loss, fever, chills, fatigue, weakness. ENT: Negative for hoarseness, difficulty swallowing , nasal congestion. CV: Negative for chest pain, angina, palpitations, dyspnea on exertion, peripheral edema.  Respiratory: Negative for dyspnea at rest, dyspnea on exertion, cough, sputum, wheezing.  GI: See history of present illness. GU:  Negative for dysuria, hematuria, urinary incontinence, urinary frequency, nocturnal urination.  Endo: Negative for unusual  weight change.    Physical Examination:   BP 132/84    Pulse 87    Temp 98.7 F (37.1 C) (Oral)    Ht 5\' 6"  (1.676 m)    Wt 172 lb (78 kg)    BMI 27.76 kg/m   General: Well-nourished, well-developed in no acute distress.  Eyes: No icterus. Conjunctivae pink. Lungs: Clear to auscultation bilaterally. Non-labored. Heart: Regular rate and rhythm, no murmurs rubs or gallops.  Abdomen: Bowel sounds are normal, nontender, nondistended, no hepatosplenomegaly or masses, no abdominal bruits or hernia , no rebound or guarding.   Extremities: No lower extremity edema. No clubbing or deformities. Neuro: Alert and oriented x 3.  Grossly  intact. Skin: Warm and dry, no jaundice.   Psych: Alert and cooperative, normal mood and affect.  Labs:    Imaging Studies: No results found.  Assessment and Plan:   Kevin Montgomery is a 56 y.o. y/o male who comes in today with a history of nausea with a severe sore throat and diarrhea with some trouble swallowing.  The patient states that most of his trouble swallowing is due to a bad taste in his mouth which has gotten better but not resolved.  The patient's diarrhea and nausea has completely resolved.  The patient has been on pantoprazole 40 mg a day and will be increased to 40 mg twice a day.  If the bad taste in his mouth is from reflux he has been told that antireflux surgery may be needed after a trial of the PPI and looking for any structural abnormalities on an EGD for which she will be set up for.  The patient eventually may need impedance studying to see how bad his reflux is and if it may be the cause of his present symptoms.  The patient has been explained the plan agrees with it.     Kevin Lame, MD. Marval Regal    Note: This dictation was prepared with Dragon dictation along with smaller phrase technology. Any transcriptional errors that result from this process are unintentional.

## 2021-06-28 ENCOUNTER — Other Ambulatory Visit: Payer: 59

## 2021-06-28 ENCOUNTER — Encounter: Payer: Self-pay | Admitting: Gastroenterology

## 2021-06-28 ENCOUNTER — Ambulatory Visit: Payer: 59 | Admitting: Family Medicine

## 2021-06-28 DIAGNOSIS — E119 Type 2 diabetes mellitus without complications: Secondary | ICD-10-CM | POA: Diagnosis not present

## 2021-06-28 NOTE — Progress Notes (Signed)
Printed A1C 

## 2021-06-29 LAB — HEMOGLOBIN A1C
Est. average glucose Bld gHb Est-mCnc: 120 mg/dL
Hgb A1c MFr Bld: 5.8 % — ABNORMAL HIGH (ref 4.8–5.6)

## 2021-06-30 ENCOUNTER — Other Ambulatory Visit: Payer: Self-pay

## 2021-06-30 DIAGNOSIS — E119 Type 2 diabetes mellitus without complications: Secondary | ICD-10-CM

## 2021-06-30 MED ORDER — LANTUS SOLOSTAR 100 UNIT/ML ~~LOC~~ SOPN: PEN_INJECTOR | SUBCUTANEOUS | 1 refills | Status: DC

## 2021-07-08 ENCOUNTER — Other Ambulatory Visit: Payer: Self-pay | Admitting: Family Medicine

## 2021-07-08 DIAGNOSIS — K219 Gastro-esophageal reflux disease without esophagitis: Secondary | ICD-10-CM

## 2021-07-11 ENCOUNTER — Ambulatory Visit: Payer: 59 | Admitting: Anesthesiology

## 2021-07-11 ENCOUNTER — Ambulatory Visit
Admission: RE | Admit: 2021-07-11 | Discharge: 2021-07-11 | Disposition: A | Discharge status: Home / Self Care | Payer: 59 | Attending: Gastroenterology | Admitting: Gastroenterology

## 2021-07-11 ENCOUNTER — Encounter
Admission: RE | Disposition: A | Discharge status: Home / Self Care | Payer: Self-pay | Source: Home / Self Care | Attending: Gastroenterology

## 2021-07-11 ENCOUNTER — Other Ambulatory Visit: Payer: Self-pay

## 2021-07-11 ENCOUNTER — Encounter: Payer: Self-pay | Admitting: Gastroenterology

## 2021-07-11 DIAGNOSIS — E1122 Type 2 diabetes mellitus with diabetic chronic kidney disease: Secondary | ICD-10-CM | POA: Diagnosis not present

## 2021-07-11 DIAGNOSIS — K219 Gastro-esophageal reflux disease without esophagitis: Secondary | ICD-10-CM

## 2021-07-11 DIAGNOSIS — Z7982 Long term (current) use of aspirin: Secondary | ICD-10-CM | POA: Insufficient documentation

## 2021-07-11 DIAGNOSIS — Z7984 Long term (current) use of oral hypoglycemic drugs: Secondary | ICD-10-CM | POA: Insufficient documentation

## 2021-07-11 DIAGNOSIS — I129 Hypertensive chronic kidney disease with stage 1 through stage 4 chronic kidney disease, or unspecified chronic kidney disease: Secondary | ICD-10-CM | POA: Insufficient documentation

## 2021-07-11 DIAGNOSIS — R1310 Dysphagia, unspecified: Secondary | ICD-10-CM | POA: Diagnosis not present

## 2021-07-11 DIAGNOSIS — E119 Type 2 diabetes mellitus without complications: Secondary | ICD-10-CM | POA: Insufficient documentation

## 2021-07-11 DIAGNOSIS — Z794 Long term (current) use of insulin: Secondary | ICD-10-CM | POA: Diagnosis not present

## 2021-07-11 DIAGNOSIS — Z79899 Other long term (current) drug therapy: Secondary | ICD-10-CM | POA: Diagnosis not present

## 2021-07-11 DIAGNOSIS — N189 Chronic kidney disease, unspecified: Secondary | ICD-10-CM | POA: Insufficient documentation

## 2021-07-11 HISTORY — PX: ESOPHAGOGASTRODUODENOSCOPY (EGD) WITH PROPOFOL: SHX5813

## 2021-07-11 LAB — GLUCOSE, CAPILLARY
Glucose-Capillary: 187 mg/dL — ABNORMAL HIGH (ref 70–99)
Glucose-Capillary: 158 mg/dL — ABNORMAL HIGH (ref 70–99)

## 2021-07-11 SURGERY — ESOPHAGOGASTRODUODENOSCOPY (EGD) WITH PROPOFOL
Anesthesia: General | Site: Throat

## 2021-07-11 MED ORDER — LACTATED RINGERS IV SOLN: INTRAVENOUS | Status: DC

## 2021-07-11 MED ORDER — ACETAMINOPHEN 325 MG PO TABS: 325.0000 mg | ORAL_TABLET | Freq: Once | ORAL | Status: DC

## 2021-07-11 MED ORDER — PANTOPRAZOLE SODIUM 40 MG PO TBEC: 40.0000 mg | DELAYED_RELEASE_TABLET | Freq: Every day | ORAL | 11 refills | Status: DC

## 2021-07-11 MED ORDER — ACETAMINOPHEN 160 MG/5ML PO SOLN: 325.0000 mg | Freq: Once | ORAL | Status: DC

## 2021-07-11 MED ORDER — SODIUM CHLORIDE 0.9 % IV SOLN: INTRAVENOUS | Status: DC

## 2021-07-11 MED ORDER — STERILE WATER FOR IRRIGATION IR SOLN
Status: DC | PRN
  Administered 2021-07-11: 250 mL

## 2021-07-11 MED ORDER — PROPOFOL 10 MG/ML IV BOLUS
INTRAVENOUS | Status: DC | PRN
Start: 2021-07-11 — End: 2021-07-11
  Administered 2021-07-11: 100 mg via INTRAVENOUS
  Administered 2021-07-11: 20 mg via INTRAVENOUS

## 2021-07-11 MED ORDER — LIDOCAINE HCL (CARDIAC) PF 100 MG/5ML IV SOSY
PREFILLED_SYRINGE | INTRAVENOUS | Status: DC | PRN
  Administered 2021-07-11: 30 mg via INTRAVENOUS

## 2021-07-11 MED ORDER — GLYCOPYRROLATE 0.2 MG/ML IJ SOLN
INTRAMUSCULAR | Status: DC | PRN
  Administered 2021-07-11: .2 mg via INTRAVENOUS

## 2021-07-11 SURGICAL SUPPLY — 7 items
FORCEPS BIOP RAD 4 LRG CAP 4 (CUTTING FORCEPS) ×1 IMPLANT
GOWN CVR UNV OPN BCK APRN NK (MISCELLANEOUS) ×2 IMPLANT
GOWN ISOL THUMB LOOP REG UNIV (MISCELLANEOUS) ×4
KIT PRC NS LF DISP ENDO (KITS) ×1 IMPLANT
KIT PROCEDURE OLYMPUS (KITS) ×2
MANIFOLD NEPTUNE II (INSTRUMENTS) ×2 IMPLANT
WATER STERILE IRR 250ML POUR (IV SOLUTION) ×2 IMPLANT

## 2021-07-11 NOTE — Transfer of Care (Signed)
Immediate Anesthesia Transfer of Care Note  Patient: Kevin Montgomery  Procedure(s) Performed: ESOPHAGOGASTRODUODENOSCOPY (EGD) WITH PROPOFOL (Throat)  Patient Location: PACU  Anesthesia Type: General  Level of Consciousness: awake, alert  and patient cooperative  Airway and Oxygen Therapy: Patient Spontanous Breathing and Patient connected to supplemental oxygen  Post-op Assessment: Post-op Vital signs reviewed, Patient's Cardiovascular Status Stable, Respiratory Function Stable, Patent Airway and No signs of Nausea or vomiting  Post-op Vital Signs: Reviewed and stable  Complications: No notable events documented.

## 2021-07-11 NOTE — Op Note (Signed)
[facility] Gastroenterology Patient Name: Kevin Montgomery Procedure Date: 07/11/2021 7:17 AM MRN: [id number] Account #: [id number] Date of Birth: 06-19-1965 Admit Type: Outpatient Age: 56 Room: [facility] OR ROOM 01 Gender: Male Note Status: Finalized Instrument Name: [id number] Procedure:             Upper GI endoscopy Indications:           Dysphagia Providers:             Lucilla Lame MD, MD Referring MD:          Juline Patch, MD (Referring MD) Medicines:             Propofol per Anesthesia Complications:         No immediate complications. Procedure:             Pre-Anesthesia Assessment:                        - Prior to the procedure, a History and Physical was                         performed, and patient medications and allergies were                         reviewed. The patient's tolerance of previous                         anesthesia was also reviewed. The risks and benefits                         of the procedure and the sedation options and risks                         were discussed with the patient. All questions were                         answered, and informed consent was obtained. Prior                         Anticoagulants: The patient has taken no previous                         anticoagulant or antiplatelet agents. ASA Grade                         Assessment: II - A patient with mild systemic disease.                         After reviewing the risks and benefits, the patient                         was deemed in satisfactory condition to undergo the                         procedure.                        After obtaining informed consent, the endoscope was  passed under direct vision. Throughout the procedure,                         the patient's blood pressure, pulse, and oxygen                         saturations were monitored continuously. The Endoscope                         was introduced through the mouth,  and advanced to the                         second part of duodenum. The upper GI endoscopy was                         accomplished without difficulty. The patient tolerated                         the procedure well. Findings:      The Z-line was irregular and was found in the lower third of the       esophagus. Biopsies were taken with a cold forceps for histology.      The stomach was normal.      The examined duodenum was normal. Impression:            - Z-line irregular, in the lower third of the                         esophagus. Biopsied.                        - Normal stomach.                        - Normal examined duodenum. Recommendation:        - Discharge patient to home.                        - Resume previous diet.                        - Continue present medications.                        - Await pathology results. Procedure Code(s):     --- Professional ---                        [phone], Esophagogastroduodenoscopy, flexible,                         transoral; with biopsy, single or multiple Diagnosis Code(s):     --- Professional ---                        R13.10, Dysphagia, unspecified CPT copyright 2019 American Medical Association. All rights reserved. The codes documented in this report are preliminary and upon coder review may  be revised to meet current compliance requirements. Lucilla Lame MD, MD 07/11/2021 7:40:00 AM This report has been signed electronically. Number of Addenda: 0 Note Initiated On: 07/11/2021 7:17 AM Total Procedure Duration: 0 hours 2 minutes  38 seconds  Estimated Blood Loss:  Estimated blood loss: none.      [facility]

## 2021-07-11 NOTE — Anesthesia Postprocedure Evaluation (Addendum)
Anesthesia Post Note  Patient: Kevin Montgomery  Procedure(s) Performed: ESOPHAGOGASTRODUODENOSCOPY (EGD) WITH PROPOFOL (Throat)     Patient location during evaluation: PACU Anesthesia Type: General Level of consciousness: awake and alert and oriented Pain management: satisfactory to patient Vital Signs Assessment: post-procedure vital signs reviewed and stable Respiratory status: spontaneous breathing, nonlabored ventilation and respiratory function stable Cardiovascular status: blood pressure returned to baseline and stable Postop Assessment: Adequate PO intake and No signs of nausea or vomiting Anesthetic complications: no   No notable events documented.  Raliegh Ip

## 2021-07-11 NOTE — Anesthesia Procedure Notes (Signed)
Procedure Name: MAC Date/Time: 07/11/2021 7:34 AM Performed by: Georga Bora, CRNA Pre-anesthesia Checklist: Patient identified, Emergency Drugs available, Suction available, Patient being monitored and Timeout performed Patient Re-evaluated:Patient Re-evaluated prior to induction Oxygen Delivery Method: Nasal cannula Induction Type: IV induction Placement Confirmation: positive ETCO2 and breath sounds checked- equal and bilateral

## 2021-07-11 NOTE — Anesthesia Preprocedure Evaluation (Signed)
Anesthesia Evaluation  Patient identified by MRN, date of birth, ID band Patient awake    Reviewed: Allergy & Precautions, H&P , NPO status , Patient's Chart, lab work & pertinent test results  History of Anesthesia Complications (+) PONV and history of anesthetic complications  Airway Mallampati: II  TM Distance: >3 FB Neck ROM: full    Dental no notable dental hx.    Pulmonary    Pulmonary exam normal breath sounds clear to auscultation       Cardiovascular hypertension, Normal cardiovascular exam Rhythm:regular Rate:Normal     Neuro/Psych    GI/Hepatic GERD  ,  Endo/Other  diabetes, Insulin Dependent  Renal/GU      Musculoskeletal   Abdominal   Peds  Hematology   Anesthesia Other Findings   Reproductive/Obstetrics                             Anesthesia Physical Anesthesia Plan  ASA: 2  Anesthesia Plan: General   Post-op Pain Management: Minimal or no pain anticipated   Induction: Intravenous  PONV Risk Score and Plan: 3 and Treatment may vary due to age or medical condition, TIVA and Propofol infusion  Airway Management Planned: Natural Airway  Additional Equipment:   Intra-op Plan:   Post-operative Plan:   Informed Consent: I have reviewed the patients History and Physical, chart, labs and discussed the procedure including the risks, benefits and alternatives for the proposed anesthesia with the patient or authorized representative who has indicated his/her understanding and acceptance.     Dental Advisory Given  Plan Discussed with: CRNA  Anesthesia Plan Comments:         Anesthesia Quick Evaluation

## 2021-07-11 NOTE — Interval H&P Note (Signed)
Lucilla Lame, MD [address] [place], [place] [address] Phone:[phone] Fax : [phone]  Primary Care Physician:  Juline Patch, MD Primary Gastroenterologist:  Dr. Allen Norris  Pre-Procedure History & Physical: HPI:  CHISTOPHER Montgomery is a 56 y.o. male is here for an endoscopy.   Past Medical History:  Diagnosis Date   Arthritis    Chronic kidney disease    h/o kidney stones   Complication of anesthesia    Diabetes mellitus without complication (HCC)    GERD (gastroesophageal reflux disease)    no meds   Hypertension    pt denies and states that Lisinopril is for kidney protection only from diabetes   PONV (postoperative nausea and vomiting)     Past Surgical History:  Procedure Laterality Date   COLONOSCOPY WITH PROPOFOL N/A 12/16/2018   Procedure: COLONOSCOPY WITH PROPOFOL;  Surgeon: Lucilla Lame, MD;  Location: [facility];  Service: Endoscopy;  Laterality: N/A;  Diabetic - insulin and oral meds   KIDNEY STONE SURGERY     LITHOTRIPSY     ROTATOR CUFF REPAIR Right 35/59/7416   [facility]   UMBILICAL HERNIA REPAIR N/A 12/20/2015   Procedure: HERNIA REPAIR UMBILICAL ADULT;  Surgeon: Hubbard Robinson, MD;  Location: [facility];  Service: General;  Laterality: N/A;    Prior to Admission medications   Medication Sig Start Date End Date Taking? Authorizing Provider  aspirin 81 MG tablet Take 1 tablet (81 mg total) by mouth daily. 04/21/15  Yes Juline Patch, MD  insulin glargine (LANTUS SOLOSTAR) 100 UNIT/ML Solostar Pen INJECT 40 UNITS INTO THE SKIN AT BEDTIME. 06/30/21  Yes Otilio Miu C, MD  lisinopril (ZESTRIL) 5 MG tablet TAKE (1) TABLET BY MOUTH EVERY MORNING 02/16/21  Yes Juline Patch, MD  metFORMIN (GLUCOPHAGE) 500 MG tablet TAKE (1) TABLET BY MOUTH TWICE DAILY 11/17/20  Yes Juline Patch, MD  pantoprazole (PROTONIX) 40 MG tablet TAKE ONE (1) TABLET BY MOUTH ONCE DAILY 07/08/21  Yes Juline Patch, MD  primidone (MYSOLINE) 50 MG tablet Take 50  mg by mouth. 12 tablets a day/ [facility]- neuro   Yes [provider]  tamsulosin (FLOMAX) 0.4 MG CAPS capsule TAKE (1) CAPSULE BY MOUTH EVERY DAY 05/19/21  Yes Juline Patch, MD  gabapentin (NEURONTIN) 100 MG capsule Take 1 capsule (100 mg total) by mouth 3 (three) times daily. Patient not taking: Reported on 06/28/2021 04/20/21   Juline Patch, MD  GLOBAL EASE INJECT PEN NEEDLES 31G X 5 MM MISC USE 1 PENTIP DAILY WITH INSULIN 09/24/20   Juline Patch, MD  GLOBAL EASE INJECT PEN NEEDLES 31G X 8 MM MISC USE 1 PENTIP DAILY WITH INSULIN 12/29/19   Juline Patch, MD  guaiFENesin-codeine (ROBITUSSIN AC) 100-10 MG/5ML syrup Take 5 mLs by mouth 3 (three) times daily as needed for cough. Patient not taking: Reported on 06/28/2021 05/24/21   Juline Patch, MD  valACYclovir (VALTREX) 500 MG tablet Take 1 tablet (500 mg total) by mouth 2 (two) times daily. Patient not taking: Reported on 06/28/2021 04/25/21   Juline Patch, MD    Allergies as of 06/27/2021   (No Known Allergies)    Family History  Problem Relation Age of Onset   Hyperlipidemia Mother    Cancer Father        Pancreatic   Diabetes Father    Cancer Paternal Uncle    Cancer Paternal Grandmother     Social History  Socioeconomic History   Marital status: Married    Spouse name: Not on file   Number of children: Not on file   Years of education: Not on file   Highest education level: Not on file  Occupational History   Not on file  Tobacco Use   Smoking status: Never   Smokeless tobacco: Never  Vaping Use   Vaping Use: Never used  Substance and Sexual Activity   Alcohol use: Yes    Alcohol/week: 0.0 standard drinks    Comment: rare   Drug use: No   Sexual activity: Yes  Other Topics Concern   Not on file  Social History Narrative   Not on file   Social Determinants of Health   Financial Resource Strain: Not on file  Food Insecurity: Not on file  Transportation Needs: Not on file  Physical  Activity: Not on file  Stress: Not on file  Social Connections: Not on file  Intimate Partner Violence: Not on file    Review of Systems: See HPI, otherwise negative ROS  Physical Exam: BP 126/86    Pulse 74    Temp (!) 97.2 F (36.2 C) (Temporal)    Ht 5\' 6"  (1.676 m)    Wt 79.4 kg    SpO2 98%    BMI 28.25 kg/m  General:   Alert,  pleasant and cooperative in NAD Head:  Normocephalic and atraumatic. Neck:  Supple; no masses or thyromegaly. Lungs:  Clear throughout to auscultation.    Heart:  Regular rate and rhythm. Abdomen:  Soft, nontender and nondistended. Normal bowel sounds, without guarding, and without rebound.   Neurologic:  Alert and  oriented x4;  grossly normal neurologically.  Impression/Plan: Kevin Montgomery is here for an endoscopy to be performed for GERD  Risks, benefits, limitations, and alternatives regarding  endoscopy have been reviewed with the patient.  Questions have been answered.  All parties agreeable.   Lucilla Lame, MD  07/11/2021, 7:15 AM

## 2021-07-12 ENCOUNTER — Encounter: Payer: Self-pay | Admitting: Gastroenterology

## 2021-07-12 LAB — SURGICAL PATHOLOGY

## 2021-07-14 ENCOUNTER — Encounter: Payer: Self-pay | Admitting: Gastroenterology

## 2021-07-25 ENCOUNTER — Other Ambulatory Visit: Payer: Self-pay | Admitting: Family Medicine

## 2021-07-25 DIAGNOSIS — N401 Enlarged prostate with lower urinary tract symptoms: Secondary | ICD-10-CM

## 2021-07-25 DIAGNOSIS — R350 Frequency of micturition: Secondary | ICD-10-CM

## 2021-07-25 DIAGNOSIS — R066 Hiccough: Secondary | ICD-10-CM

## 2021-07-25 DIAGNOSIS — I10 Essential (primary) hypertension: Secondary | ICD-10-CM

## 2021-09-02 ENCOUNTER — Ambulatory Visit (INDEPENDENT_AMBULATORY_CARE_PROVIDER_SITE_OTHER): Payer: 59 | Admitting: Family Medicine

## 2021-09-02 ENCOUNTER — Encounter: Payer: Self-pay | Admitting: Family Medicine

## 2021-09-02 ENCOUNTER — Other Ambulatory Visit: Payer: Self-pay

## 2021-09-02 VITALS — BP 120/80 | HR 68 | Ht 66.0 in | Wt 184.0 lb

## 2021-09-02 DIAGNOSIS — K219 Gastro-esophageal reflux disease without esophagitis: Secondary | ICD-10-CM | POA: Diagnosis not present

## 2021-09-02 DIAGNOSIS — R350 Frequency of micturition: Secondary | ICD-10-CM | POA: Diagnosis not present

## 2021-09-02 DIAGNOSIS — E119 Type 2 diabetes mellitus without complications: Secondary | ICD-10-CM | POA: Diagnosis not present

## 2021-09-02 DIAGNOSIS — I10 Essential (primary) hypertension: Secondary | ICD-10-CM | POA: Diagnosis not present

## 2021-09-02 DIAGNOSIS — N401 Enlarged prostate with lower urinary tract symptoms: Secondary | ICD-10-CM | POA: Diagnosis not present

## 2021-09-02 LAB — HM DIABETES FOOT EXAM: HM Diabetic Foot Exam: NORMAL

## 2021-09-02 MED ORDER — TAMSULOSIN HCL 0.4 MG PO CAPS: ORAL_CAPSULE | ORAL | 1 refills | Status: DC

## 2021-09-02 MED ORDER — METFORMIN HCL 500 MG PO TABS: ORAL_TABLET | ORAL | 1 refills | Status: DC

## 2021-09-02 MED ORDER — PANTOPRAZOLE SODIUM 40 MG PO TBEC: 40.0000 mg | DELAYED_RELEASE_TABLET | Freq: Two times a day (BID) | ORAL | 1 refills | Status: DC

## 2021-09-02 MED ORDER — LISINOPRIL 5 MG PO TABS: ORAL_TABLET | ORAL | 0 refills | Status: DC

## 2021-09-02 MED ORDER — LANTUS SOLOSTAR 100 UNIT/ML ~~LOC~~ SOPN: PEN_INJECTOR | SUBCUTANEOUS | 1 refills | Status: DC

## 2021-09-02 MED ORDER — TAMSULOSIN HCL 0.4 MG PO CAPS: ORAL_CAPSULE | ORAL | 0 refills | Status: DC

## 2021-09-02 NOTE — Progress Notes (Signed)
? ? ?Date:  09/02/2021  ? ?Name:  Kevin Montgomery   DOB:  05-06-1966   MRN:  [id number] ? ? ?Chief Complaint: Diabetes, Hypertension, and Benign Prostatic Hypertrophy ? ?Diabetes ?He presents for his follow-up diabetic visit. He has type 2 diabetes mellitus. His disease course has been stable. There are no hypoglycemic associated symptoms. Pertinent negatives for hypoglycemia include no dizziness, headaches, nervousness/anxiousness or sweats. There are no diabetic associated symptoms. Pertinent negatives for diabetes include no blurred vision, no chest pain, no fatigue, no foot paresthesias, no foot ulcerations, no polydipsia, no polyphagia, no polyuria, no visual change, no weakness and no weight loss. There are no hypoglycemic complications. Symptoms are stable. There are no diabetic complications. Pertinent negatives for diabetic complications include no CVA, PVD or retinopathy. Risk factors for coronary artery disease include dyslipidemia, male sex, obesity, hypertension and diabetes mellitus. Current diabetic treatment includes insulin injections and oral agent (monotherapy). He is compliant with treatment most of the time. He is following a generally healthy diet. Meal planning includes avoidance of concentrated sweets and carbohydrate counting. An ACE inhibitor/angiotensin II receptor blocker is being taken. Eye exam is not current.  ?Hypertension ?This is a chronic problem. The current episode started more than 1 year ago. The problem has been waxing and waning since onset. Pertinent negatives include no anxiety, blurred vision, chest pain, headaches, malaise/fatigue, neck pain, orthopnea, palpitations, peripheral edema, PND, shortness of breath or sweats. There are no associated agents to hypertension. There are no known risk factors for coronary artery disease. Past treatments include ACE inhibitors. The current treatment provides moderate improvement. There are no compliance problems.  There is no history  of angina, kidney disease, CAD/MI, CVA, heart failure, left ventricular hypertrophy, PVD or retinopathy. There is no history of chronic renal disease or a hypertension causing med.  ?Benign Prostatic Hypertrophy ?Irritative symptoms do not include frequency or urgency. Pertinent negatives include no chills, dysuria, hematuria or nausea.  ? ?Lab Results  ?Component Value Date  ? NA 134 04/20/2021  ? K 4.2 04/20/2021  ? CO2 22 04/20/2021  ? GLUCOSE 92 04/20/2021  ? BUN 15 04/20/2021  ? CREATININE 0.88 04/20/2021  ? CALCIUM 9.7 04/20/2021  ? EGFR 102 04/20/2021  ? GFRNONAA 105 08/03/2020  ? ?Lab Results  ?Component Value Date  ? CHOL 152 04/20/2021  ? HDL 37 (L) 04/20/2021  ? [place] 94 04/20/2021  ? TRIG 114 04/20/2021  ? CHOLHDL 4.1 01/27/2020  ? ?No results found for: TSH ?Lab Results  ?Component Value Date  ? HGBA1C 5.8 (H) 06/28/2021  ? ?Lab Results  ?Component Value Date  ? WBC 5.7 09/21/2020  ? HGB 15.5 09/21/2020  ? HCT 42.0 09/21/2020  ? MCV 89.2 09/21/2020  ? PLT 158 09/21/2020  ? ?Lab Results  ?Component Value Date  ? ALT 50 (H) 01/24/2019  ? AST 29 01/24/2019  ? ALKPHOS 49 01/24/2019  ? BILITOT 1.7 (H) 01/24/2019  ? ?No results found for: 25OHVITD2, [place], VD25OH  ? ?Review of Systems  ?Constitutional:  Negative for chills, fatigue, fever, malaise/fatigue and weight loss.  ?HENT:  Negative for drooling, ear discharge, ear pain and sore throat.   ?Eyes:  Negative for blurred vision.  ?Respiratory:  Negative for cough, shortness of breath and wheezing.   ?Cardiovascular:  Negative for chest pain, palpitations, orthopnea, leg swelling and PND.  ?Gastrointestinal:  Negative for abdominal pain, blood in stool, constipation, diarrhea and nausea.  ?Endocrine: Negative for polydipsia, polyphagia and polyuria.  ?Genitourinary:  Negative for dysuria, frequency, hematuria and urgency.  ?Musculoskeletal:  Negative for back pain, myalgias and neck pain.  ?Skin:  Negative for rash.  ?Allergic/Immunologic: Negative  for environmental allergies.  ?Neurological:  Negative for dizziness, weakness and headaches.  ?Hematological:  Does not bruise/bleed easily.  ?Psychiatric/Behavioral:  Negative for suicidal ideas. The patient is not nervous/anxious.   ? ?Patient Active Problem List  ? Diagnosis Date Noted  ? Encounter for screening colonoscopy   ? Gastroesophageal reflux disease 12/05/2017  ? Incarcerated umbilical hernia   ? Umbilical hernia 69/45/0388  ? Diastasis recti 10/08/2015  ? Essential hypertension 04/21/2015  ? Hyperlipidemia 04/21/2015  ? ? ?No Known Allergies ? ?Past Surgical History:  ?Procedure Laterality Date  ? COLONOSCOPY WITH PROPOFOL N/A 12/16/2018  ? Procedure: COLONOSCOPY WITH PROPOFOL;  Surgeon: Lucilla Lame, MD;  Location: [facility];  Service: Endoscopy;  Laterality: N/A;  Diabetic - insulin and oral meds  ? ESOPHAGOGASTRODUODENOSCOPY (EGD) WITH PROPOFOL N/A 07/11/2021  ? Procedure: ESOPHAGOGASTRODUODENOSCOPY (EGD) WITH PROPOFOL;  Surgeon: Lucilla Lame, MD;  Location: [facility];  Service: Endoscopy;  Laterality: N/A;  Diabetic  ? KIDNEY STONE SURGERY    ? LITHOTRIPSY    ? ROTATOR CUFF REPAIR Right 03/23/2020  ? [facility]  ? UMBILICAL HERNIA REPAIR N/A 12/20/2015  ? Procedure: HERNIA REPAIR UMBILICAL ADULT;  Surgeon: Hubbard Robinson, MD;  Location: [facility];  Service: General;  Laterality: N/A;  ? ? ?Social History  ? ?Tobacco Use  ? Smoking status: Never  ? Smokeless tobacco: Never  ?Vaping Use  ? Vaping Use: Never used  ?Substance Use Topics  ? Alcohol use: Yes  ?  Alcohol/week: 0.0 standard drinks  ?  Comment: rare  ? Drug use: No  ? ? ? ?Medication list has been reviewed and updated. ? ?Current Meds  ?Medication Sig  ? aspirin 81 MG tablet Take 1 tablet (81 mg total) by mouth daily.  ? GLOBAL EASE INJECT PEN NEEDLES 31G X 5 MM MISC USE 1 PENTIP DAILY WITH INSULIN  ? GLOBAL EASE INJECT PEN NEEDLES 31G X 8 MM MISC USE 1 PENTIP DAILY WITH INSULIN  ? insulin glargine (LANTUS SOLOSTAR) 100  UNIT/ML Solostar Pen INJECT 40 UNITS INTO THE SKIN AT BEDTIME.  ? lisinopril (ZESTRIL) 5 MG tablet TAKE (1) TABLET BY MOUTH EVERY MORNING  ? metFORMIN (GLUCOPHAGE) 500 MG tablet TAKE (1) TABLET BY MOUTH TWICE DAILY  ? pantoprazole (PROTONIX) 40 MG tablet TAKE ONE (1) TABLET BY MOUTH ONCE DAILY  ? primidone (MYSOLINE) 50 MG tablet Take 50 mg by mouth. 12 tablets a day/ [facility]- neuro  ? tamsulosin (FLOMAX) 0.4 MG CAPS capsule TAKE (1) CAPSULE BY MOUTH EVERY DAY  ? ? ? ?  05/24/2021  ?  8:23 AM 04/25/2021  ?  3:15 PM 04/20/2021  ? 11:01 AM 03/03/2021  ?  4:41 PM  ?PHQ 2/9 Scores  ?PHQ - 2 Score 0 0 1 0  ?PHQ- 9 Score 0 9 9 0  ? ? ? ?  05/24/2021  ?  8:23 AM 04/25/2021  ?  3:16 PM 04/20/2021  ? 11:02 AM 03/03/2021  ?  4:41 PM  ?GAD 7 : Generalized Anxiety Score  ?Nervous, Anxious, on Edge 0 0 0 0  ?Control/stop worrying 0 0 0 0  ?Worry too much - different things 0 0 0 0  ?Trouble relaxing 0 3 0 0  ?Restless 0 3 0 0  ?Easily annoyed or irritable 0 0 0 0  ?Afraid - awful  might happen 0 0 0 0  ?Total GAD 7 Score 0 6 0 0  ?Anxiety Difficulty Not difficult at all     ? ? ?BP Readings from Last 3 Encounters:  ?09/02/21 120/80  ?07/11/21 119/77  ?06/27/21 132/84  ? ? ?Physical Exam ?Vitals and nursing note reviewed.  ?HENT:  ?   Head: Normocephalic.  ?   Right Ear: Tympanic membrane, ear canal and external ear normal.  ?   Left Ear: Tympanic membrane, ear canal and external ear normal.  ?   Nose: Nose normal. No congestion or rhinorrhea.  ?   Mouth/Throat:  ?   Mouth: Mucous membranes are moist.  ?Eyes:  ?   General: No scleral icterus.    ?   Right eye: No discharge.     ?   Left eye: No discharge.  ?   Conjunctiva/sclera: Conjunctivae normal.  ?   Pupils: Pupils are equal, round, and reactive to light.  ?Neck:  ?   Thyroid: No thyromegaly.  ?   Vascular: No JVD.  ?   Trachea: No tracheal deviation.  ?Cardiovascular:  ?   Rate and Rhythm: Normal rate and regular rhythm.  ?   Heart sounds: Normal heart sounds. No murmur heard. ?   No friction rub. No gallop.  ?Pulmonary:  ?   Effort: No respiratory distress.  ?   Breath sounds: Normal breath sounds. No wheezing, rhonchi or rales.  ?Abdominal:  ?   General: Bowel sounds are normal.

## 2021-09-04 LAB — MICROALBUMIN, URINE: Microalbumin, Urine: 13.5 ug/mL

## 2021-10-25 LAB — HM DIABETES EYE EXAM

## 2021-11-17 ENCOUNTER — Other Ambulatory Visit: Payer: Self-pay | Admitting: Family Medicine

## 2021-11-17 DIAGNOSIS — N401 Enlarged prostate with lower urinary tract symptoms: Secondary | ICD-10-CM

## 2021-11-30 ENCOUNTER — Telehealth: Payer: Self-pay | Admitting: Family Medicine

## 2021-11-30 NOTE — Telephone Encounter (Signed)
Copied from [place] [id number]. Topic: General - Other >> Nov 30, 2021  8:51 AM Marcellus Scott wrote: Reason for CRM: Pt stated would like to leave a message for Baxter Flattery to call him back concerning his wife.  Pt declined to provide any further details and only wanted to speak with Baxter Flattery.  Please advise.

## 2021-12-20 ENCOUNTER — Encounter: Payer: Self-pay | Admitting: Family Medicine

## 2021-12-20 ENCOUNTER — Ambulatory Visit: Payer: 59 | Admitting: Family Medicine

## 2021-12-20 ENCOUNTER — Other Ambulatory Visit: Payer: Self-pay | Admitting: Family Medicine

## 2021-12-20 VITALS — BP 136/80 | HR 72 | Ht 66.0 in | Wt 190.0 lb

## 2021-12-20 DIAGNOSIS — E119 Type 2 diabetes mellitus without complications: Secondary | ICD-10-CM

## 2021-12-20 DIAGNOSIS — E782 Mixed hyperlipidemia: Secondary | ICD-10-CM

## 2021-12-20 MED ORDER — METFORMIN HCL 500 MG PO TABS
ORAL_TABLET | ORAL | 1 refills | Status: DC
Start: 2021-12-20 — End: 2022-03-21

## 2021-12-20 MED ORDER — BASAGLAR KWIKPEN 100 UNIT/ML ~~LOC~~ SOPN: 40.0000 [IU] | PEN_INJECTOR | Freq: Every day | SUBCUTANEOUS | 0 refills | Status: DC

## 2021-12-20 NOTE — Progress Notes (Signed)
Date:  12/20/2021   Name:  Kevin Montgomery   DOB:  [birth year]   MRN:  [id number]   Chief Complaint: Diabetes  Diabetes He presents for his follow-up diabetic visit. He has type 2 diabetes mellitus. His disease course has been stable. There are no hypoglycemic associated symptoms. Pertinent negatives for hypoglycemia include no dizziness, headaches or nervousness/anxiousness. Pertinent negatives for diabetes include no chest pain, no polydipsia, no polyphagia, no polyuria, no visual change and no weight loss. There are no hypoglycemic complications. Symptoms are stable. There are no diabetic complications. Current diabetic treatment includes insulin injections and oral agent (monotherapy). He is compliant with treatment all of the time. His weight is stable. He is following a generally healthy diet. Meal planning includes avoidance of concentrated sweets and carbohydrate counting. He rarely participates in exercise. There is no change in his home blood glucose trend. His breakfast blood glucose is taken between 8-9 am. His breakfast blood glucose range is generally 130-140 mg/dl.    Lab Results  Component Value Date   NA 134 04/20/2021   K 4.2 04/20/2021   CO2 22 04/20/2021   GLUCOSE 92 04/20/2021   BUN 15 04/20/2021   CREATININE 0.88 04/20/2021   CALCIUM 9.7 04/20/2021   EGFR 102 04/20/2021   GFRNONAA 105 08/03/2020   Lab Results  Component Value Date   CHOL 152 04/20/2021   HDL 37 (L) 04/20/2021   LDLCALC 94 04/20/2021   TRIG 114 04/20/2021   CHOLHDL 4.1 01/27/2020   No results found for: "TSH" Lab Results  Component Value Date   HGBA1C 5.8 (H) 06/28/2021   Lab Results  Component Value Date   WBC 5.7 09/21/2020   HGB 15.5 09/21/2020   HCT 42.0 09/21/2020   MCV 89.2 09/21/2020   PLT 158 09/21/2020   Lab Results  Component Value Date   ALT 50 (H) 01/24/2019   AST 29 01/24/2019   ALKPHOS 49 01/24/2019   BILITOT 1.7 (H) 01/24/2019   No results found for:  "25OHVITD2", "25OHVITD3", "VD25OH"   Review of Systems  Constitutional:  Negative for chills, fever and weight loss.  HENT:  Negative for drooling, ear discharge, ear pain and sore throat.   Respiratory:  Negative for cough, shortness of breath and wheezing.   Cardiovascular:  Negative for chest pain, palpitations and leg swelling.  Gastrointestinal:  Negative for abdominal pain, blood in stool, constipation, diarrhea and nausea.  Endocrine: Negative for polydipsia, polyphagia and polyuria.  Genitourinary:  Negative for dysuria, frequency, hematuria and urgency.  Musculoskeletal:  Negative for back pain, myalgias and neck pain.  Skin:  Negative for rash.  Allergic/Immunologic: Negative for environmental allergies.  Neurological:  Negative for dizziness and headaches.  Hematological:  Does not bruise/bleed easily.  Psychiatric/Behavioral:  Negative for suicidal ideas. The patient is not nervous/anxious.     Patient Active Problem List   Diagnosis Date Noted   Encounter for screening colonoscopy    Gastroesophageal reflux disease 12/05/2017   Incarcerated umbilical hernia    Umbilical hernia 56/97/9480   Diastasis recti 10/08/2015   Essential hypertension 04/21/2015   Hyperlipidemia 04/21/2015    No Known Allergies  Past Surgical History:  Procedure Laterality Date   COLONOSCOPY WITH PROPOFOL N/A 12/16/2018   Procedure: COLONOSCOPY WITH PROPOFOL;  Surgeon: Lucilla Lame, MD;  Location: [facility];  Service: Endoscopy;  Laterality: N/A;  Diabetic - insulin and oral meds   ESOPHAGOGASTRODUODENOSCOPY (EGD) WITH PROPOFOL N/A 07/11/2021   Procedure: ESOPHAGOGASTRODUODENOSCOPY (EGD) WITH  PROPOFOL;  Surgeon: Lucilla Lame, MD;  Location: [facility];  Service: Endoscopy;  Laterality: N/A;  Diabetic   KIDNEY STONE SURGERY     LITHOTRIPSY     ROTATOR CUFF REPAIR Right 39/53/2023   [facility]   UMBILICAL HERNIA REPAIR N/A 12/20/2015   Procedure: HERNIA REPAIR UMBILICAL ADULT;   Surgeon: Hubbard Robinson, MD;  Location: [facility];  Service: General;  Laterality: N/A;    Social History   Tobacco Use   Smoking status: Never   Smokeless tobacco: Never  Vaping Use   Vaping Use: Never used  Substance Use Topics   Alcohol use: Yes    Alcohol/week: 0.0 standard drinks of alcohol    Comment: rare   Drug use: No     Medication list has been reviewed and updated.  Current Meds  Medication Sig   aspirin 81 MG tablet Take 1 tablet (81 mg total) by mouth daily.   GLOBAL EASE INJECT PEN NEEDLES 31G X 5 MM MISC USE 1 PENTIP DAILY WITH INSULIN   GLOBAL EASE INJECT PEN NEEDLES 31G X 8 MM MISC USE 1 PENTIP DAILY WITH INSULIN   insulin glargine (LANTUS SOLOSTAR) 100 UNIT/ML Solostar Pen INJECT 40 UNITS INTO THE SKIN AT BEDTIME.   lisinopril (ZESTRIL) 5 MG tablet Take 1 tablet by mouth daily   metFORMIN (GLUCOPHAGE) 500 MG tablet TAKE (1) TABLET BY MOUTH TWICE DAILY   pantoprazole (PROTONIX) 40 MG tablet Take 1 tablet (40 mg total) by mouth 2 (two) times daily.   primidone (MYSOLINE) 250 MG tablet Take 500 mg by mouth at bedtime. 2 tablets daily- neuro   tamsulosin (FLOMAX) 0.4 MG CAPS capsule TAKE (2) CAPSULES BY MOUTH EVERY DAY       12/20/2021    8:49 AM 05/24/2021    8:23 AM 04/25/2021    3:16 PM 04/20/2021   11:02 AM  GAD 7 : Generalized Anxiety Score  Nervous, Anxious, on Edge 0 0 0 0  Control/stop worrying 0 0 0 0  Worry too much - different things 0 0 0 0  Trouble relaxing 0 0 3 0  Restless 0 0 3 0  Easily annoyed or irritable 0 0 0 0  Afraid - awful might happen 0 0 0 0  Total GAD 7 Score 0 0 6 0  Anxiety Difficulty Not difficult at all Not difficult at all         12/20/2021    8:49 AM 05/24/2021    8:23 AM 04/25/2021    3:15 PM  Depression screen PHQ 2/9  Decreased Interest 0 0 0  Down, Depressed, Hopeless 0 0 0  PHQ - 2 Score 0 0 0  Altered sleeping 2 0 3  Tired, decreased energy 0 0 3  Change in appetite 0 0 3  Feeling bad or failure  about yourself  0 0 0  Trouble concentrating 0 0 0  Moving slowly or fidgety/restless 0 0 0  Suicidal thoughts 0 0 0  PHQ-9 Score 2 0 9  Difficult doing work/chores Not difficult at all  Extremely dIfficult    BP Readings from Last 3 Encounters:  12/20/21 (!) 142/74  09/02/21 120/80  07/11/21 119/77    Physical Exam Vitals and nursing note reviewed.  HENT:     Head: Normocephalic.     Right Ear: External ear normal.     Left Ear: External ear normal.     Nose: Nose normal.  Eyes:     General: No scleral  icterus.       Right eye: No discharge.        Left eye: No discharge.     Conjunctiva/sclera: Conjunctivae normal.     Pupils: Pupils are equal, round, and reactive to light.  Neck:     Thyroid: No thyromegaly.     Vascular: No JVD.     Trachea: No tracheal deviation.  Cardiovascular:     Rate and Rhythm: Normal rate and regular rhythm.     Heart sounds: Normal heart sounds. No murmur heard.    No friction rub. No gallop.  Pulmonary:     Effort: No respiratory distress.     Breath sounds: Normal breath sounds. No wheezing, rhonchi or rales.  Abdominal:     General: Bowel sounds are normal.     Palpations: Abdomen is soft. There is no mass.     Tenderness: There is no abdominal tenderness. There is no guarding or rebound.  Musculoskeletal:        General: No tenderness. Normal range of motion.     Cervical back: Normal range of motion and neck supple.  Lymphadenopathy:     Cervical: No cervical adenopathy.  Skin:    General: Skin is warm.     Findings: No rash.  Neurological:     Mental Status: He is alert.     Wt Readings from Last 3 Encounters:  12/20/21 190 lb (86.2 kg)  09/02/21 184 lb (83.5 kg)  07/11/21 175 lb (79.4 kg)    BP (!) 142/74   Pulse 72   Ht 5' 6"  (1.676 m)   Wt 190 lb (86.2 kg)   SpO2 98%   BMI 30.67 kg/m   Assessment and Plan:  1. Type 2 diabetes mellitus without complication, without long-term current use of insulin  (HCC) Chronic.  Relatively controlled.  Stable.  Asymptomatic.  Tolerating medication well.  Continue metformin 500 mg 1 twice a day and Basaglar 40 units daily.  We will recheck patient's A1c today.  We will recheck patient in 4 months. - metFORMIN (GLUCOPHAGE) 500 MG tablet; TAKE (1) TABLET BY MOUTH TWICE DAILY  Dispense: 180 tablet; Refill: 1 - HgB A1c - Insulin Glargine (BASAGLAR KWIKPEN) 100 UNIT/ML; Inject 40 Units into the skin daily.  Dispense: 45 mL; Refill: 0  2. Mixed hyperlipidemia Chronic.  Controlled.  Stable.  We will check current lipid panel for LDL control. - Lipid Panel With LDL/HDL Ratio

## 2021-12-21 ENCOUNTER — Other Ambulatory Visit: Payer: Self-pay

## 2021-12-21 DIAGNOSIS — E119 Type 2 diabetes mellitus without complications: Secondary | ICD-10-CM

## 2021-12-21 LAB — LIPID PANEL WITH LDL/HDL RATIO
Cholesterol, Total: 179 mg/dL (ref 100–199)
HDL: 40 mg/dL (ref >39)
LDL Chol Calc (NIH): 108 mg/dL — ABNORMAL HIGH (ref 0–99)
LDL/HDL Ratio: 2.7 ratio (ref 0.0–3.6)
Triglycerides: 178 mg/dL — ABNORMAL HIGH (ref 0–149)
VLDL Cholesterol Cal: 31 mg/dL (ref 5–40)

## 2021-12-21 LAB — HEMOGLOBIN A1C
Est. average glucose Bld gHb Est-mCnc: 166 mg/dL
Hgb A1c MFr Bld: 7.4 % — ABNORMAL HIGH (ref 4.8–5.6)

## 2021-12-21 MED ORDER — LANTUS SOLOSTAR 100 UNIT/ML ~~LOC~~ SOPN: PEN_INJECTOR | SUBCUTANEOUS | 1 refills | Status: DC

## 2022-01-02 ENCOUNTER — Ambulatory Visit: Payer: 59 | Admitting: Family Medicine

## 2022-01-10 ENCOUNTER — Other Ambulatory Visit: Payer: Self-pay | Admitting: Family Medicine

## 2022-01-10 DIAGNOSIS — I10 Essential (primary) hypertension: Secondary | ICD-10-CM

## 2022-01-31 ENCOUNTER — Ambulatory Visit: Payer: 59 | Admitting: Family Medicine

## 2022-01-31 ENCOUNTER — Encounter: Payer: Self-pay | Admitting: Family Medicine

## 2022-01-31 VITALS — BP 130/80 | HR 76 | Ht 66.0 in | Wt 192.0 lb

## 2022-01-31 DIAGNOSIS — E119 Type 2 diabetes mellitus without complications: Secondary | ICD-10-CM | POA: Diagnosis not present

## 2022-01-31 NOTE — Addendum Note (Signed)
Addended by: Fredderick Severance on: 01/31/2022 04:43 PM   Modules accepted: Level of Service

## 2022-01-31 NOTE — Progress Notes (Signed)
Date:  01/31/2022   Name:  Kevin Montgomery   DOB:  [birth year]   MRN:  [id number]   Chief Complaint: Diabetes (Increased to 47 units on insulin at last visit)  Diabetes He presents for his follow-up diabetic visit. He has type 2 diabetes mellitus. His disease course has been stable. There are no hypoglycemic associated symptoms. Pertinent negatives for hypoglycemia include no dizziness, headaches or nervousness/anxiousness. Pertinent negatives for diabetes include no blurred vision, no chest pain, no polydipsia, no polyuria, no visual change and no weight loss. There are no hypoglycemic complications. Symptoms are stable. There are no diabetic complications. Current diabetic treatment includes insulin injections and oral agent (monotherapy). He is compliant with treatment all of the time. He is following a generally healthy diet. Meal planning includes avoidance of concentrated sweets and carbohydrate counting. He participates in exercise daily. An ACE inhibitor/angiotensin II receptor blocker is being taken.    Lab Results  Component Value Date   NA 134 04/20/2021   K 4.2 04/20/2021   CO2 22 04/20/2021   GLUCOSE 92 04/20/2021   BUN 15 04/20/2021   CREATININE 0.88 04/20/2021   CALCIUM 9.7 04/20/2021   EGFR 102 04/20/2021   GFRNONAA 105 08/03/2020   Lab Results  Component Value Date   CHOL 179 12/20/2021   HDL 40 12/20/2021   LDLCALC 108 (H) 12/20/2021   TRIG 178 (H) 12/20/2021   CHOLHDL 4.1 01/27/2020   No results found for: "TSH" Lab Results  Component Value Date   HGBA1C 7.4 (H) 12/20/2021   Lab Results  Component Value Date   WBC 5.7 09/21/2020   HGB 15.5 09/21/2020   HCT 42.0 09/21/2020   MCV 89.2 09/21/2020   PLT 158 09/21/2020   Lab Results  Component Value Date   ALT 50 (H) 01/24/2019   AST 29 01/24/2019   ALKPHOS 49 01/24/2019   BILITOT 1.7 (H) 01/24/2019   No results found for: "25OHVITD2", "25OHVITD3", "VD25OH"   Review of Systems  Constitutional:   Negative for chills, fever and weight loss.  HENT:  Negative for drooling, ear discharge, ear pain and sore throat.   Eyes:  Negative for blurred vision.  Respiratory:  Negative for cough, shortness of breath and wheezing.   Cardiovascular:  Negative for chest pain, palpitations and leg swelling.  Gastrointestinal:  Negative for abdominal pain, blood in stool, constipation, diarrhea and nausea.  Endocrine: Negative for polydipsia and polyuria.  Genitourinary:  Negative for dysuria, frequency, hematuria and urgency.  Musculoskeletal:  Negative for back pain, myalgias and neck pain.  Skin:  Negative for rash.  Allergic/Immunologic: Negative for environmental allergies.  Neurological:  Negative for dizziness and headaches.  Hematological:  Does not bruise/bleed easily.  Psychiatric/Behavioral:  Negative for suicidal ideas. The patient is not nervous/anxious.     Patient Active Problem List   Diagnosis Date Noted   Encounter for screening colonoscopy    Gastroesophageal reflux disease 12/05/2017   Incarcerated umbilical hernia    Umbilical hernia 57/84/6962   Diastasis recti 10/08/2015   Essential hypertension 04/21/2015   Hyperlipidemia 04/21/2015    No Known Allergies  Past Surgical History:  Procedure Laterality Date   COLONOSCOPY WITH PROPOFOL N/A 12/16/2018   Procedure: COLONOSCOPY WITH PROPOFOL;  Surgeon: Lucilla Lame, MD;  Location: [place];  Service: Endoscopy;  Laterality: N/A;  Diabetic - insulin and oral meds   ESOPHAGOGASTRODUODENOSCOPY (EGD) WITH PROPOFOL N/A 07/11/2021   Procedure: ESOPHAGOGASTRODUODENOSCOPY (EGD) WITH PROPOFOL;  Surgeon: Lucilla Lame, MD;  Location: [place];  Service: Endoscopy;  Laterality: N/A;  Diabetic   KIDNEY STONE SURGERY     LITHOTRIPSY     ROTATOR CUFF REPAIR Right 17/51/0258   [facility]   UMBILICAL HERNIA REPAIR N/A 12/20/2015   Procedure: HERNIA REPAIR UMBILICAL ADULT;  Surgeon: Hubbard Robinson, MD;  Location: [facility];  Service: General;  Laterality: N/A;    Social History   Tobacco Use   Smoking status: Never   Smokeless tobacco: Never  Vaping Use   Vaping Use: Never used  Substance Use Topics   Alcohol use: Yes    Alcohol/week: 0.0 standard drinks of alcohol    Comment: rare   Drug use: No     Medication list has been reviewed and updated.  Current Meds  Medication Sig   aspirin 81 MG tablet Take 1 tablet (81 mg total) by mouth daily.   GLOBAL EASE INJECT PEN NEEDLES 31G X 5 MM MISC USE 1 PENTIP DAILY WITH INSULIN   GLOBAL EASE INJECT PEN NEEDLES 31G X 8 MM MISC USE 1 PENTIP DAILY WITH INSULIN   Insulin Glargine (BASAGLAR KWIKPEN) 100 UNIT/ML Inject 40 Units into the skin daily. (Patient taking differently: Inject 47 Units into the skin daily.)   lisinopril (ZESTRIL) 5 MG tablet TAKE (1) TABLET BY MOUTH EVERY DAY   metFORMIN (GLUCOPHAGE) 500 MG tablet TAKE (1) TABLET BY MOUTH TWICE DAILY   pantoprazole (PROTONIX) 40 MG tablet Take 1 tablet (40 mg total) by mouth 2 (two) times daily.   primidone (MYSOLINE) 250 MG tablet Take 500 mg by mouth at bedtime. 2 tablets daily- neuro   tamsulosin (FLOMAX) 0.4 MG CAPS capsule TAKE (2) CAPSULES BY MOUTH EVERY DAY       01/31/2022    7:56 AM 12/20/2021    8:49 AM 05/24/2021    8:23 AM 04/25/2021    3:16 PM  GAD 7 : Generalized Anxiety Score  Nervous, Anxious, on Edge 0 0 0 0  Control/stop worrying 0 0 0 0  Worry too much - different things 0 0 0 0  Trouble relaxing 0 0 0 3  Restless 0 0 0 3  Easily annoyed or irritable 0 0 0 0  Afraid - awful might happen 0 0 0 0  Total GAD 7 Score 0 0 0 6  Anxiety Difficulty Not difficult at all Not difficult at all Not difficult at all        01/31/2022    7:56 AM 12/20/2021    8:49 AM 05/24/2021    8:23 AM  Depression screen PHQ 2/9  Decreased Interest 0 0 0  Down, Depressed, Hopeless 0 0 0  PHQ - 2 Score 0 0 0  Altered sleeping 0 2 0  Tired, decreased energy 0 0 0  Change in appetite 0 0 0   Feeling bad or failure about yourself  0 0 0  Trouble concentrating 0 0 0  Moving slowly or fidgety/restless 0 0 0  Suicidal thoughts 0 0 0  PHQ-9 Score 0 2 0  Difficult doing work/chores Not difficult at all Not difficult at all     BP Readings from Last 3 Encounters:  01/31/22 130/80  12/20/21 136/80  09/02/21 120/80    Physical Exam Vitals reviewed.  HENT:     Head: Normocephalic.     Right Ear: Tympanic membrane and external ear normal.     Left Ear: Tympanic membrane and external ear normal.     Nose: Nose normal.  No congestion.     Mouth/Throat:     Mouth: Mucous membranes are moist.  Eyes:     General: No scleral icterus.       Right eye: No discharge.        Left eye: No discharge.     Conjunctiva/sclera: Conjunctivae normal.     Pupils: Pupils are equal, round, and reactive to light.  Neck:     Thyroid: No thyromegaly.     Vascular: No JVD.     Trachea: No tracheal deviation.  Cardiovascular:     Rate and Rhythm: Normal rate and regular rhythm.     Heart sounds: Normal heart sounds. No murmur heard.    No friction rub. No gallop.  Pulmonary:     Effort: No respiratory distress.     Breath sounds: Normal breath sounds. No wheezing, rhonchi or rales.  Abdominal:     General: Bowel sounds are normal.     Palpations: Abdomen is soft. There is no mass.     Tenderness: There is no abdominal tenderness. There is no guarding or rebound.  Musculoskeletal:        General: No tenderness. Normal range of motion.     Cervical back: Normal range of motion and neck supple.  Lymphadenopathy:     Cervical: No cervical adenopathy.  Skin:    General: Skin is warm.     Findings: No rash.  Neurological:     Mental Status: He is alert.     Deep Tendon Reflexes: Reflexes are normal and symmetric.     Wt Readings from Last 3 Encounters:  01/31/22 192 lb (87.1 kg)  12/20/21 190 lb (86.2 kg)  09/02/21 184 lb (83.5 kg)    BP 130/80   Pulse 76   Ht _0  (1.676 m)    Wt 192 lb (87.1 kg)   BMI 30.99 kg/m   Assessment and Plan:  1. Type 2 diabetes mellitus without complication, without long-term current use of insulin (HCC) Chronic.  Controlled.  Stable.  Asymptomatic.  Tolerating increased dosing of Basaglar.  We will likely continue at current dosing pending A1c.  Patient used to be on Jardiance but it became too expensive but I understand that Wilder Glade is a option with his insurance currently and this may be a possibility if A1c still is not in control range. - HgB A1c    Otilio Miu, MD

## 2022-02-01 LAB — HEMOGLOBIN A1C
Est. average glucose Bld gHb Est-mCnc: 157 mg/dL
Hgb A1c MFr Bld: 7.1 % — ABNORMAL HIGH (ref 4.8–5.6)

## 2022-02-02 ENCOUNTER — Other Ambulatory Visit: Payer: Self-pay

## 2022-02-02 DIAGNOSIS — E119 Type 2 diabetes mellitus without complications: Secondary | ICD-10-CM

## 2022-02-02 MED ORDER — BASAGLAR KWIKPEN 100 UNIT/ML ~~LOC~~ SOPN: 50.0000 [IU] | PEN_INJECTOR | Freq: Every day | SUBCUTANEOUS | 0 refills | Status: DC

## 2022-03-13 ENCOUNTER — Other Ambulatory Visit: Payer: Self-pay | Admitting: Gastroenterology

## 2022-03-13 DIAGNOSIS — K219 Gastro-esophageal reflux disease without esophagitis: Secondary | ICD-10-CM

## 2022-03-14 ENCOUNTER — Other Ambulatory Visit: Payer: Self-pay | Admitting: Family Medicine

## 2022-03-14 DIAGNOSIS — E119 Type 2 diabetes mellitus without complications: Secondary | ICD-10-CM

## 2022-03-14 NOTE — Telephone Encounter (Signed)
Unable to refill per protocol, request is too soon. Last refill was 12/20/21 for 90 and 1 RF. E-Prescribing Status: Receipt confirmed by pharmacy (12/20/2021 9:06 AM ). Will refuse.  Requested Prescriptions  Pending Prescriptions Disp Refills  . metFORMIN (GLUCOPHAGE) 500 MG tablet [Pharmacy Med Name: METFORMIN HCL 500 MG TAB] 180 tablet 1    Sig: TAKE (1) TABLET BY MOUTH TWICE DAILY     Endocrinology:  Diabetes - Biguanides Failed - 03/14/2022 11:28 AM      Failed - B12 Level in normal range and within 720 days    No results found for: "VITAMINB12"       Failed - CBC within normal limits and completed in the last 12 months    WBC  Date Value Ref Range Status  09/21/2020 5.7 4.0 - 10.5 K/uL Final   RBC  Date Value Ref Range Status  09/21/2020 4.71 4.22 - 5.81 MIL/uL Final   Hemoglobin  Date Value Ref Range Status  09/21/2020 15.5 13.0 - 17.0 g/dL Final   HGB  Date Value Ref Range Status  11/10/2013 15.6 13.0 - 18.0 g/dL Final   HCT  Date Value Ref Range Status  09/21/2020 42.0 39.0 - 52.0 % Final  11/10/2013 44.1 40.0 - 52.0 % Final   MCHC  Date Value Ref Range Status  09/21/2020 36.9 (H) 30.0 - 36.0 g/dL Final   [facility]  Date Value Ref Range Status  09/21/2020 32.9 26.0 - 34.0 pg Final   MCV  Date Value Ref Range Status  09/21/2020 89.2 80.0 - 100.0 fL Final  11/10/2013 94 80 - 100 fL Final   No results found for: "PLTCOUNTKUC", "LABPLAT", "POCPLA" RDW  Date Value Ref Range Status  09/21/2020 12.3 11.5 - 15.5 % Final  11/10/2013 12.3 11.5 - 14.5 % Final         Passed - Cr in normal range and within 360 days    Creatinine  Date Value Ref Range Status  11/10/2013 0.79 0.60 - 1.30 mg/dL Final   Creatinine, Ser  Date Value Ref Range Status  04/20/2021 0.88 0.76 - 1.27 mg/dL Final         Passed - HBA1C is between 0 and 7.9 and within 180 days    Hemoglobin A1C  Date Value Ref Range Status  09/26/2012 7.5 (H) 4.2 - 6.3 % Final    Comment:    The American  Diabetes Association recommends that a primary goal of therapy should be <7% and that physicians should reevaluate the treatment regimen in patients with HbA1c values consistently >8%.    Hgb A1c MFr Bld  Date Value Ref Range Status  01/31/2022 7.1 (H) 4.8 - 5.6 % Final    Comment:             Prediabetes: 5.7 - 6.4          Diabetes: >6.4          Glycemic control for adults with diabetes: <7.0          Passed - eGFR in normal range and within 360 days    EGFR (African American)  Date Value Ref Range Status  11/10/2013 >60  Final   GFR calc Af Amer  Date Value Ref Range Status  08/03/2020 122 >59 mL/min/1.73 Final    Comment:    **In accordance with recommendations from the NKF-ASN Task force,**   [facility] is in the process of updating its eGFR calculation to the   2021 CKD-EPI creatinine equation  that estimates kidney function   without a race variable.    EGFR (Non-African Amer.)  Date Value Ref Range Status  11/10/2013 >60  Final    Comment:    eGFR values <49m/min/1.73 m2 may be an indication of chronic kidney disease (CKD). Calculated eGFR is useful in patients with stable renal function. The eGFR calculation will not be reliable in acutely ill patients when serum creatinine is changing rapidly. It is not useful in  patients on dialysis. The eGFR calculation may not be applicable to patients at the low and high extremes of body sizes, pregnant women, and vegetarians.    GFR calc non Af Amer  Date Value Ref Range Status  08/03/2020 105 >59 mL/min/1.73 Final   eGFR  Date Value Ref Range Status  04/20/2021 102 >59 mL/min/1.73 Final         Passed - Valid encounter within last 6 months    Recent Outpatient Visits          1 month ago Type 2 diabetes mellitus without complication, without long-term current use of insulin ([place]   [place] [facility] [place], [place]   2 months ago Mixed hyperlipidemia   [facility]   6 months ago Type 2 diabetes mellitus without complication, without long-term current use of insulin ([place]   [place] [facility] [facility] [place], [place]   9 months ago [place] [place]   10 months ago Dysphagia, unspecified type   [facility] [place]      Future Appointments            In 1 month JJuline Patch MD [facility]

## 2022-03-16 ENCOUNTER — Other Ambulatory Visit: Payer: Self-pay | Admitting: Family Medicine

## 2022-03-16 DIAGNOSIS — E119 Type 2 diabetes mellitus without complications: Secondary | ICD-10-CM

## 2022-03-17 NOTE — Telephone Encounter (Signed)
Dose inconsistent with current med list. Requested Prescriptions  Pending Prescriptions Disp Refills  . Insulin Glargine (BASAGLAR KWIKPEN) 100 UNIT/ML [Pharmacy Med Name: BASAGLAR 100 UNIT/ML KWIKPEN]      Sig: INJECT 40 UNITS INTO THE SKIN DAILY     Endocrinology:  Diabetes - Insulins Passed - 03/16/2022  4:46 PM      Passed - HBA1C is between 0 and 7.9 and within 180 days    Hemoglobin A1C  Date Value Ref Range Status  09/26/2012 7.5 (H) 4.2 - 6.3 % Final    Comment:    The American Diabetes Association recommends that a primary goal of therapy should be <7% and that physicians should reevaluate the treatment regimen in patients with HbA1c values consistently >8%.    Hgb A1c MFr Bld  Date Value Ref Range Status  01/31/2022 7.1 (H) 4.8 - 5.6 % Final    Comment:             Prediabetes: 5.7 - 6.4          Diabetes: >6.4          Glycemic control for adults with diabetes: <7.0          Passed - Valid encounter within last 6 months    Recent Outpatient Visits          1 month ago Type 2 diabetes mellitus without complication, without long-term current use of insulin ([place])   [place] [facility] at [place], Deanna C, MD   2 months ago Mixed hyperlipidemia   [place] [facility] at [facility], MD   6 months ago Type 2 diabetes mellitus without complication, without long-term current use of insulin ([place])   [place] [facility] at [place], Deanna C, MD   9 months ago [place] [facility] at [place], [place], MD   10 months ago Dysphagia, unspecified type   [facility] at [place], [place], MD      Future Appointments            In 1 month Juline Patch, MD [place] [facility] at [facility], [facility]

## 2022-03-20 ENCOUNTER — Other Ambulatory Visit: Payer: Self-pay | Admitting: Family Medicine

## 2022-03-20 DIAGNOSIS — E119 Type 2 diabetes mellitus without complications: Secondary | ICD-10-CM

## 2022-03-21 NOTE — Telephone Encounter (Signed)
Previous not received by pharm.  Requested Prescriptions  Pending Prescriptions Disp Refills  . metFORMIN (GLUCOPHAGE) 500 MG tablet [Pharmacy Med Name: METFORMIN HCL 500 MG TAB] 180 tablet 1    Sig: TAKE (1) TABLET BY MOUTH TWICE DAILY     Endocrinology:  Diabetes - Biguanides Failed - 03/20/2022  4:11 PM      Failed - B12 Level in normal range and within 720 days    No results found for: "VITAMINB12"       Failed - CBC within normal limits and completed in the last 12 months    WBC  Date Value Ref Range Status  09/21/2020 5.7 4.0 - 10.5 K/uL Final   RBC  Date Value Ref Range Status  09/21/2020 4.71 4.22 - 5.81 MIL/uL Final   Hemoglobin  Date Value Ref Range Status  09/21/2020 15.5 13.0 - 17.0 g/dL Final   HGB  Date Value Ref Range Status  11/10/2013 15.6 13.0 - 18.0 g/dL Final   HCT  Date Value Ref Range Status  09/21/2020 42.0 39.0 - 52.0 % Final  11/10/2013 44.1 40.0 - 52.0 % Final   MCHC  Date Value Ref Range Status  09/21/2020 36.9 (H) 30.0 - 36.0 g/dL Final   [facility]  Date Value Ref Range Status  09/21/2020 32.9 26.0 - 34.0 pg Final   MCV  Date Value Ref Range Status  09/21/2020 89.2 80.0 - 100.0 fL Final  11/10/2013 94 80 - 100 fL Final   No results found for: "PLTCOUNTKUC", "LABPLAT", "POCPLA" RDW  Date Value Ref Range Status  09/21/2020 12.3 11.5 - 15.5 % Final  11/10/2013 12.3 11.5 - 14.5 % Final         Passed - Cr in normal range and within 360 days    Creatinine  Date Value Ref Range Status  11/10/2013 0.79 0.60 - 1.30 mg/dL Final   Creatinine, Ser  Date Value Ref Range Status  04/20/2021 0.88 0.76 - 1.27 mg/dL Final         Passed - HBA1C is between 0 and 7.9 and within 180 days    Hemoglobin A1C  Date Value Ref Range Status  09/26/2012 7.5 (H) 4.2 - 6.3 % Final    Comment:    The American Diabetes Association recommends that a primary goal of therapy should be <7% and that physicians should reevaluate the treatment regimen in patients  with HbA1c values consistently >8%.    Hgb A1c MFr Bld  Date Value Ref Range Status  01/31/2022 7.1 (H) 4.8 - 5.6 % Final    Comment:             Prediabetes: 5.7 - 6.4          Diabetes: >6.4          Glycemic control for adults with diabetes: <7.0          Passed - eGFR in normal range and within 360 days    EGFR (African American)  Date Value Ref Range Status  11/10/2013 >60  Final   GFR calc Af Amer  Date Value Ref Range Status  08/03/2020 122 >59 mL/min/1.73 Final    Comment:    **In accordance with recommendations from the NKF-ASN Task force,**   [facility] is in the process of updating its eGFR calculation to the   2021 CKD-EPI creatinine equation that estimates kidney function   without a race variable.    EGFR (Non-African Amer.)  Date Value Ref Range Status  11/10/2013 >  60  Final    Comment:    eGFR values <58m/min/1.73 m2 may be an indication of chronic kidney disease (CKD). Calculated eGFR is useful in patients with stable renal function. The eGFR calculation will not be reliable in acutely ill patients when serum creatinine is changing rapidly. It is not useful in  patients on dialysis. The eGFR calculation may not be applicable to patients at the low and high extremes of body sizes, pregnant women, and vegetarians.    GFR calc non Af Amer  Date Value Ref Range Status  08/03/2020 105 >59 mL/min/1.73 Final   eGFR  Date Value Ref Range Status  04/20/2021 102 >59 mL/min/1.73 Final         Passed - Valid encounter within last 6 months    Recent Outpatient Visits          1 month ago Type 2 diabetes mellitus without complication, without long-term current use of insulin ([place]   [place] [facility] [place], MD   3 months ago Mixed hyperlipidemia   [place] [facility]   6 months ago Type 2 diabetes mellitus without complication, without  long-term current use of insulin ([place]    [facility] [place], MD   10 months ago [place] [place] MD   10 months ago Dysphagia, unspecified type   [facility] [place]      Future Appointments            In 1 month JJuline Patch MD [facility]

## 2022-03-23 ENCOUNTER — Ambulatory Visit: Payer: Self-pay | Admitting: *Deleted

## 2022-03-23 ENCOUNTER — Other Ambulatory Visit: Payer: Self-pay

## 2022-03-23 DIAGNOSIS — E119 Type 2 diabetes mellitus without complications: Secondary | ICD-10-CM | POA: Diagnosis not present

## 2022-03-23 NOTE — Telephone Encounter (Signed)
  Chief Complaint: SE with increased inulin dosing Symptoms: upset stomach, diarrhea Frequency: daily Pertinent Negatives: Patient denies   Disposition: '[]'$ ED /'[]'$ Urgent Care (no appt availability in office) / '[]'$ Appointment(In office/virtual)/ '[]'$  [place]/ '[]'$ Home Care/ '[]'$ Refused Recommended Disposition /'[]'$ [place] [facility]/ '[x]'$  Follow-up with PCP Additional Notes: Patient want to know if he has other options

## 2022-03-23 NOTE — Telephone Encounter (Signed)
Summary: Not tolerating medication   Patient states that since changing the dosage on Insulin Glargine (BASAGLAR KWIKPEN) 100 UNIT/ML from 47 units to 50 units he has been experiencing a upset stomach and diarrhea. Patient wants to know if medication can be changed. Please advise.      Reason for Disposition  [1] Caller has URGENT medicine question about med that PCP or specialist prescribed AND [2] triager unable to answer question  Answer Assessment - Initial Assessment Questions 1. NAME of MEDICINE: "What medicine(s) are you calling about?"     Basaglar kwikpen 2. QUESTION: "What is your question?" (e.g., double dose of medicine, side effect)     New SE with increased dosing 3. PRESCRIBER: "Who prescribed the medicine?" Reason: if prescribed by specialist, call should be referred to that group.     PCP 4. SYMPTOMS: "Do you have any symptoms?" If Yes, ask: "What symptoms are you having?"  "How bad are the symptoms (e.g., mild, moderate, severe)     Upset stomach, diarrhea- using Pepto almost daily  Patient has questions about Maunjaro weekly- would this be an option?-not sure if his insurance would cover.  Protocols used: Medication Question Call-A-AH

## 2022-03-24 ENCOUNTER — Other Ambulatory Visit: Payer: Self-pay

## 2022-03-24 DIAGNOSIS — E119 Type 2 diabetes mellitus without complications: Secondary | ICD-10-CM

## 2022-03-24 LAB — HEMOGLOBIN A1C
Est. average glucose Bld gHb Est-mCnc: 154 mg/dL
Hgb A1c MFr Bld: 7 % — ABNORMAL HIGH (ref 4.8–5.6)

## 2022-03-24 MED ORDER — BASAGLAR KWIKPEN 100 UNIT/ML ~~LOC~~ SOPN: 47.0000 [IU] | PEN_INJECTOR | Freq: Every day | SUBCUTANEOUS | 0 refills | Status: DC

## 2022-03-29 ENCOUNTER — Telehealth: Payer: Self-pay | Admitting: Family Medicine

## 2022-03-29 NOTE — Telephone Encounter (Signed)
Sent message to referrals team.   KP

## 2022-03-29 NOTE — Telephone Encounter (Signed)
Copied from [place] [id number]. Topic: Referral - Status >> Mar 29, 2022 10:44 AM Cyndi Bender wrote: Reason for CRM: Kevin Montgomery with [facility] reports that pt insurance is out of network so the referral would need to be sent to another location

## 2022-03-29 NOTE — Telephone Encounter (Signed)
Referral sent to [facility] location that is the other place he can be referred to.  KP

## 2022-03-30 NOTE — Telephone Encounter (Signed)
Pt calling to follow up on the referral. Per the message below, advised pt referral was sent to the [facility] location; that is the other place he can be referred to.   Pt stated he would wait for them to reach out to him to schedule.

## 2022-03-30 NOTE — Telephone Encounter (Signed)
Noted  KP 

## 2022-04-04 ENCOUNTER — Other Ambulatory Visit: Payer: 59 | Admitting: Family Medicine

## 2022-04-07 ENCOUNTER — Other Ambulatory Visit: Payer: Self-pay | Admitting: Family Medicine

## 2022-04-07 DIAGNOSIS — I10 Essential (primary) hypertension: Secondary | ICD-10-CM

## 2022-04-24 ENCOUNTER — Ambulatory Visit: Payer: 59 | Admitting: Family Medicine

## 2022-04-24 ENCOUNTER — Encounter: Payer: Self-pay | Admitting: Family Medicine

## 2022-04-24 VITALS — BP 120/64 | HR 84 | Ht 66.0 in | Wt 190.0 lb

## 2022-04-24 DIAGNOSIS — I10 Essential (primary) hypertension: Secondary | ICD-10-CM

## 2022-04-24 DIAGNOSIS — R350 Frequency of micturition: Secondary | ICD-10-CM

## 2022-04-24 DIAGNOSIS — E119 Type 2 diabetes mellitus without complications: Secondary | ICD-10-CM | POA: Diagnosis not present

## 2022-04-24 DIAGNOSIS — N401 Enlarged prostate with lower urinary tract symptoms: Secondary | ICD-10-CM

## 2022-04-24 MED ORDER — LISINOPRIL 5 MG PO TABS: ORAL_TABLET | ORAL | 1 refills | Status: DC

## 2022-04-24 MED ORDER — METFORMIN HCL 500 MG PO TABS: ORAL_TABLET | ORAL | 1 refills | Status: DC

## 2022-04-24 MED ORDER — BASAGLAR KWIKPEN 100 UNIT/ML ~~LOC~~ SOPN: 50.0000 [IU] | PEN_INJECTOR | Freq: Every day | SUBCUTANEOUS | 0 refills | Status: DC

## 2022-04-24 NOTE — Progress Notes (Signed)
Date:  04/24/2022   Name:  Kevin Montgomery   DOB:  10/27/1965   MRN:  [id number]   Chief Complaint: Diabetes  Diabetes He presents for his follow-up diabetic visit. He has type 2 diabetes mellitus. His disease course has been stable. There are no hypoglycemic associated symptoms. Pertinent negatives for hypoglycemia include no dizziness, headaches, nervousness/anxiousness or sweats. Pertinent negatives for diabetes include no blurred vision, no chest pain, no polydipsia, no polyuria and no weight loss. There are no hypoglycemic complications. Symptoms are stable. Risk factors for coronary artery disease include dyslipidemia and hypertension.    Lab Results  Component Value Date   NA 134 04/20/2021   K 4.2 04/20/2021   CO2 22 04/20/2021   GLUCOSE 92 04/20/2021   BUN 15 04/20/2021   CREATININE 0.88 04/20/2021   CALCIUM 9.7 04/20/2021   EGFR 102 04/20/2021   GFRNONAA 105 08/03/2020   Lab Results  Component Value Date   CHOL 179 12/20/2021   HDL 40 12/20/2021   LDLCALC 108 (H) 12/20/2021   TRIG 178 (H) 12/20/2021   CHOLHDL 4.1 01/27/2020   No results found for: "TSH" Lab Results  Component Value Date   HGBA1C 7.0 (H) 03/23/2022   Lab Results  Component Value Date   WBC 5.7 09/21/2020   HGB 15.5 09/21/2020   HCT 42.0 09/21/2020   MCV 89.2 09/21/2020   PLT 158 09/21/2020   Lab Results  Component Value Date   ALT 50 (H) 01/24/2019   AST 29 01/24/2019   ALKPHOS 49 01/24/2019   BILITOT 1.7 (H) 01/24/2019   No results found for: "25OHVITD2", "25OHVITD3", "VD25OH"   Review of Systems  Constitutional:  Negative for chills, fever and weight loss.  HENT:  Negative for drooling, ear discharge, ear pain and sore throat.   Eyes:  Negative for blurred vision.  Respiratory:  Negative for cough, shortness of breath and wheezing.   Cardiovascular:  Negative for chest pain, palpitations and leg swelling.  Gastrointestinal:  Negative for abdominal pain, blood in stool,  constipation, diarrhea and nausea.  Endocrine: Negative for polydipsia and polyuria.  Genitourinary:  Negative for dysuria, frequency, hematuria and urgency.  Musculoskeletal:  Negative for back pain, myalgias and neck pain.  Skin:  Negative for rash.  Allergic/Immunologic: Negative for environmental allergies.  Neurological:  Negative for dizziness and headaches.  Hematological:  Does not bruise/bleed easily.  Psychiatric/Behavioral:  Negative for suicidal ideas. The patient is not nervous/anxious.     Patient Active Problem List   Diagnosis Date Noted   Encounter for screening colonoscopy    Gastroesophageal reflux disease 12/05/2017   Incarcerated umbilical hernia    Umbilical hernia 76/16/0737   Diastasis recti 10/08/2015   Essential hypertension 04/21/2015   Hyperlipidemia 04/21/2015    No Known Allergies  Past Surgical History:  Procedure Laterality Date   COLONOSCOPY WITH PROPOFOL N/A 12/16/2018   Procedure: COLONOSCOPY WITH PROPOFOL;  Surgeon: Lucilla Lame, MD;  Location: [place];  Service: Endoscopy;  Laterality: N/A;  Diabetic - insulin and oral meds   ESOPHAGOGASTRODUODENOSCOPY (EGD) WITH PROPOFOL N/A 07/11/2021   Procedure: ESOPHAGOGASTRODUODENOSCOPY (EGD) WITH PROPOFOL;  Surgeon: Lucilla Lame, MD;  Location: [place];  Service: Endoscopy;  Laterality: N/A;  Diabetic   KIDNEY STONE SURGERY     LITHOTRIPSY     ROTATOR CUFF REPAIR Right 10/62/6948   [facility]   UMBILICAL HERNIA REPAIR N/A 12/20/2015   Procedure: HERNIA REPAIR UMBILICAL ADULT;  Surgeon: Hubbard Robinson, MD;  Location: [facility];  Service: General;  Laterality: N/A;    Social History   Tobacco Use   Smoking status: Never   Smokeless tobacco: Never  Vaping Use   Vaping Use: Never used  Substance Use Topics   Alcohol use: Yes    Alcohol/week: 0.0 standard drinks of alcohol    Comment: rare   Drug use: No     Medication list has been reviewed and updated.  Current Meds   Medication Sig   aspirin 81 MG tablet Take 1 tablet (81 mg total) by mouth daily.   GLOBAL EASE INJECT PEN NEEDLES 31G X 5 MM MISC USE 1 PENTIP DAILY WITH INSULIN   GLOBAL EASE INJECT PEN NEEDLES 31G X 8 MM MISC USE 1 PENTIP DAILY WITH INSULIN   Insulin Glargine (BASAGLAR KWIKPEN) 100 UNIT/ML Inject 47 Units into the skin daily. (Patient taking differently: Inject 50 Units into the skin daily.)   lisinopril (ZESTRIL) 5 MG tablet TAKE (1) TABLET BY MOUTH EVERY DAY   metFORMIN (GLUCOPHAGE) 500 MG tablet TAKE (1) TABLET BY MOUTH TWICE DAILY   pantoprazole (PROTONIX) 40 MG tablet TAKE (1) TABLET BY MOUTH TWICE DAILY   primidone (MYSOLINE) 250 MG tablet Take 500 mg by mouth at bedtime. 2 tablets daily- neuro   tamsulosin (FLOMAX) 0.4 MG CAPS capsule TAKE (2) CAPSULES BY MOUTH EVERY DAY       04/24/2022    9:13 AM 01/31/2022    7:56 AM 12/20/2021    8:49 AM 05/24/2021    8:23 AM  GAD 7 : Generalized Anxiety Score  Nervous, Anxious, on Edge 0 0 0 0  Control/stop worrying 0 0 0 0  Worry too much - different things 0 0 0 0  Trouble relaxing 0 0 0 0  Restless 0 0 0 0  Easily annoyed or irritable 0 0 0 0  Afraid - awful might happen 0 0 0 0  Total GAD 7 Score 0 0 0 0  Anxiety Difficulty Not difficult at all Not difficult at all Not difficult at all Not difficult at all       04/24/2022    9:13 AM 01/31/2022    7:56 AM 12/20/2021    8:49 AM  Depression screen PHQ 2/9  Decreased Interest 0 0 0  Down, Depressed, Hopeless 0 0 0  PHQ - 2 Score 0 0 0  Altered sleeping 0 0 2  Tired, decreased energy 0 0 0  Change in appetite 0 0 0  Feeling bad or failure about yourself  0 0 0  Trouble concentrating 0 0 0  Moving slowly or fidgety/restless 0 0 0  Suicidal thoughts 0 0 0  PHQ-9 Score 0 0 2  Difficult doing work/chores Not difficult at all Not difficult at all Not difficult at all    BP Readings from Last 3 Encounters:  04/24/22 120/64  01/31/22 130/80  12/20/21 136/80    Physical  Exam Vitals and nursing note reviewed.  HENT:     Head: Normocephalic.     Right Ear: Tympanic membrane and external ear normal.     Left Ear: Tympanic membrane and external ear normal.     Nose: Nose normal.     Mouth/Throat:     Mouth: Mucous membranes are moist.     Pharynx: No oropharyngeal exudate or posterior oropharyngeal erythema.  Eyes:     General: No scleral icterus.       Right eye: No discharge.  Left eye: No discharge.     Conjunctiva/sclera: Conjunctivae normal.     Pupils: Pupils are equal, round, and reactive to light.  Neck:     Thyroid: No thyromegaly.     Vascular: No JVD.     Trachea: No tracheal deviation.  Cardiovascular:     Rate and Rhythm: Normal rate and regular rhythm.     Heart sounds: Normal heart sounds. No murmur heard.    No friction rub. No gallop.  Pulmonary:     Effort: No respiratory distress.     Breath sounds: Normal breath sounds. No wheezing, rhonchi or rales.  Abdominal:     General: Bowel sounds are normal.     Palpations: Abdomen is soft. There is no mass.     Tenderness: There is no abdominal tenderness. There is no guarding or rebound.  Musculoskeletal:        General: No tenderness. Normal range of motion.     Cervical back: Normal range of motion and neck supple.  Lymphadenopathy:     Cervical: No cervical adenopathy.  Skin:    General: Skin is warm.     Findings: No rash.  Neurological:     Mental Status: He is alert.     Wt Readings from Last 3 Encounters:  04/24/22 190 lb (86.2 kg)  01/31/22 192 lb (87.1 kg)  12/20/21 190 lb (86.2 kg)    BP 120/64   Pulse 84   Ht _0  (1.676 m)   Wt 190 lb (86.2 kg)   SpO2 99%   BMI 30.67 kg/m   Assessment and Plan:  1. Type 2 diabetes mellitus without complication, without long-term current use of insulin (HCC) Chronic.  Controlled.  Stable.  Currently is tolerating combination of Basaglar 50 units nightly and metformin 500 mg twice a day.  We will check A1c and  microalbuminuria to see what current A1c is since it was 7.1 and with the increase in Basaglar to 50 if there is in control range patient has been reinforced with his dietary approach as well. - metFORMIN (GLUCOPHAGE) 500 MG tablet; TAKE (1) TABLET BY MOUTH TWICE DAILY  Dispense: 180 tablet; Refill: 1 - Comprehensive Metabolic Panel (CMET) - Insulin Glargine (BASAGLAR KWIKPEN) 100 UNIT/ML; Inject 50 Units into the skin daily.  Dispense: 15 mL; Refill: 0 - Microalbumin / creatinine urine ratio - Hemoglobin A1c  2. Essential hypertension Chronic.  Controlled.  Stable.  Blood pressure today is 120/64.  Continue lisinopril 5 mg once a day.  And will check CMP for electrolytes and GFR. - lisinopril (ZESTRIL) 5 MG tablet; TAKE (1) TABLET BY MOUTH EVERY DAY  Dispense: 90 tablet; Refill: 1 - Comprehensive Metabolic Panel (CMET)  3. Benign prostatic hyperplasia with urinary frequency Chronic.  Controlled.  Stable.  Asymptomatic and is not having any difficulty with initiating urination or with weak stream or incomplete emptying.   Otilio Miu, MD

## 2022-04-25 LAB — COMPREHENSIVE METABOLIC PANEL
ALT: 37 IU/L (ref 0–44)
AST: 18 IU/L (ref 0–40)
Albumin/Globulin Ratio: 2.9 — ABNORMAL HIGH (ref 1.2–2.2)
Albumin: 5 g/dL — ABNORMAL HIGH (ref 3.8–4.9)
Alkaline Phosphatase: 54 IU/L (ref 44–121)
BUN/Creatinine Ratio: 14 (ref 9–20)
BUN: 14 mg/dL (ref 6–24)
Bilirubin Total: 0.6 mg/dL (ref 0.0–1.2)
CO2: 20 mmol/L (ref 20–29)
Calcium: 9.5 mg/dL (ref 8.7–10.2)
Chloride: 98 mmol/L (ref 96–106)
Creatinine, Ser: 1.03 mg/dL (ref 0.76–1.27)
Globulin, Total: 1.7 g/dL (ref 1.5–4.5)
Glucose: 269 mg/dL — ABNORMAL HIGH (ref 70–99)
Potassium: 4.3 mmol/L (ref 3.5–5.2)
Sodium: 136 mmol/L (ref 134–144)
Total Protein: 6.7 g/dL (ref 6.0–8.5)
eGFR: 85 mL/min/{1.73_m2} (ref >59)

## 2022-04-25 LAB — HEMOGLOBIN A1C
Est. average glucose Bld gHb Est-mCnc: 166 mg/dL
Hgb A1c MFr Bld: 7.4 % — ABNORMAL HIGH (ref 4.8–5.6)

## 2022-05-11 ENCOUNTER — Ambulatory Visit: Payer: 59 | Admitting: Family Medicine

## 2022-05-11 ENCOUNTER — Encounter: Payer: Self-pay | Admitting: Family Medicine

## 2022-05-11 VITALS — BP 110/68 | HR 92 | Temp 98.9°F | Ht 66.0 in | Wt 189.0 lb

## 2022-05-11 DIAGNOSIS — J111 Influenza due to unidentified influenza virus with other respiratory manifestations: Secondary | ICD-10-CM | POA: Diagnosis not present

## 2022-05-11 DIAGNOSIS — R52 Pain, unspecified: Secondary | ICD-10-CM | POA: Diagnosis not present

## 2022-05-11 LAB — POCT INFLUENZA A/B
Influenza A, POC: NEGATIVE
Influenza B, POC: NEGATIVE

## 2022-05-11 MED ORDER — OSELTAMIVIR PHOSPHATE 75 MG PO CAPS: 75.0000 mg | ORAL_CAPSULE | Freq: Two times a day (BID) | ORAL | 0 refills | Status: DC

## 2022-05-11 NOTE — Progress Notes (Signed)
     Primary Care / Sports Medicine Office Visit  Patient Information:  Patient ID: Kevin Montgomery, male DOB: 1965/10/16 Age: 56 y.o. MRN: [id number]   Kevin Montgomery is a pleasant 56 y.o. male presenting with the following:  Chief Complaint  Patient presents with   Headache    1 day, cough, body aches, exposed to flu. Did take 1 dual action advil around 9am    Vitals:   05/11/22 1101  BP: 110/68  Pulse: 92  Temp: 98.9 F (37.2 C)  SpO2: 97%   Vitals:   05/11/22 1101  Weight: 189 lb (85.7 kg)  Height: '5\' 6"'$  (1.676 m)   Body mass index is 30.51 kg/m.  No results found.   Independent interpretation of notes and tests performed by another provider:   None  Procedures performed:   None  Pertinent History, Exam, Impression, and Recommendations:   Problem List Items Addressed This Visit       Respiratory   Influenza - Primary    1 day history of severe head pressure, body aches, chills, fatigue, "feels like I was run over ".  Has multiple flu positive contacts at work.  Has been dosing ibuprofen and acetaminophen primarily for head pressure and myalgias.  Exam with benign cardiopulmonary findings, tenderness about the left frontal sinus, mildly erythematous turbinates bilaterally, tympanic membranes canals benign, oropharynx benign, shotty cervical lymphadenopathy bilaterally.  Point-of-care flu test negative.  Given the multiple confirmed flu positive contacts at his workplace, stated symptoms, findings today, will preemptively treat for flu as his negative result most likely represents early testing in his disease course.  Tamiflu sent to pharmacy, advised on scheduled dosing of ibuprofen-acetaminophen combo, and to contact us tomorrow for any persistent head pressure at which point oral steroids to be considered, also to contact us early next week for persistent symptoms primarily left facial region, at which point bacterial sinusitis to be considered.  He can  otherwise follow-up as needed.      Relevant Medications   oseltamivir (TAMIFLU) 75 MG capsule   Other Visit Diagnoses     Body aches       Relevant Orders   POCT Influenza A/B        Orders & Medications Meds ordered this encounter  Medications   oseltamivir (TAMIFLU) 75 MG capsule    Sig: Take 1 capsule (75 mg total) by mouth 2 (two) times daily.    Dispense:  10 capsule    Refill:  0   Orders Placed This Encounter  Procedures   POCT Influenza A/B     No follow-ups on file.     Montel Culver, MD   Primary Care Sports Medicine [place]

## 2022-05-11 NOTE — Assessment & Plan Note (Signed)
1 day history of severe head pressure, body aches, chills, fatigue, "feels like I was run over ".  Has multiple flu positive contacts at work.  Has been dosing ibuprofen and acetaminophen primarily for head pressure and myalgias.  Exam with benign cardiopulmonary findings, tenderness about the left frontal sinus, mildly erythematous turbinates bilaterally, tympanic membranes canals benign, oropharynx benign, shotty cervical lymphadenopathy bilaterally.  Point-of-care flu test negative.  Given the multiple confirmed flu positive contacts at his workplace, stated symptoms, findings today, will preemptively treat for flu as his negative result most likely represents early testing in his disease course.  Tamiflu sent to pharmacy, advised on scheduled dosing of ibuprofen-acetaminophen combo, and to contact us tomorrow for any persistent head pressure at which point oral steroids to be considered, also to contact us early next week for persistent symptoms primarily left facial region, at which point bacterial sinusitis to be considered.  He can otherwise follow-up as needed.

## 2022-05-11 NOTE — Patient Instructions (Signed)
-   Dose Tamiflu for full course - Dose ibuprofen-acetaminophen combo pill, 2 pills 3 times a day - If head pressure symptoms persist without improvement despite the above tomorrow, contact us for steroids - If head pressure symptoms persist into next week without improvement, contact our office for next steps - Rest with adequate hydration 24-48 hours, recommend vitamin C and D - Follow-up as needed

## 2022-05-12 ENCOUNTER — Ambulatory Visit: Payer: Self-pay

## 2022-05-12 ENCOUNTER — Other Ambulatory Visit: Payer: Self-pay | Admitting: Family Medicine

## 2022-05-12 MED ORDER — METHYLPREDNISOLONE 4 MG PO TBPK: ORAL_TABLET | ORAL | 0 refills | Status: DC

## 2022-05-12 NOTE — Telephone Encounter (Signed)
  Chief Complaint: HA  - medication request Symptoms: not improved Frequency:  Pertinent Negatives: Patient denies  Disposition: '[]'$ ED /'[]'$ Urgent Care (no appt availability in office) / '[]'$ Appointment(In office/virtual)/ '[]'$  [place] [facility]/ '[]'$ Home Care/ '[]'$ Refused Recommended Disposition /'[]'$ [place] [facility]/ '[x]'$  Follow-up with PCP Additional Notes: PT states he feels not better since his OV yesterday. Per note pt was to call back with update on condition. Pt would like steroids that were discussed to be call into pharmacy.  Note from OV:   and to contact us tomorrow for any persistent head pressure at which point oral steroids to be considered,    Summary: Steroid request following yesterday's appt   Pt called to report that he needs the steroids discussed with Rosette Reveal sent to [facility]. Says headache is not improving     Answer Assessment - Initial Assessment Questions 1. DRUG NAME: "What medicine do you need to have refilled?"     Pt would like steroids as discussed yesterday at the office. 2. REFILLS REMAINING: "How many refills are remaining?" (Note: The label on the medicine or pill bottle will show how many refills are remaining. If there are no refills remaining, then a renewal may be needed.)      3. EXPIRATION DATE: "What is the expiration date?" (Note: The label states when the prescription will expire, and thus can no longer be refilled.)      4. PRESCRIBING HCP: "Who prescribed it?" Reason: If prescribed by specialist, call should be referred to that group.      5. SYMPTOMS: "Do you have any symptoms?"      6. PREGNANCY: "Is there any chance that you are pregnant?" "When was your last menstrual period?"  Protocols used: Medication Refill and Renewal Call-A-AH

## 2022-05-12 NOTE — Telephone Encounter (Signed)
Pt informed.  KP

## 2022-05-12 NOTE — Telephone Encounter (Signed)
Pt stated that he is not feeling better at all. Pt stated he is stuck in the bed and not able to get out of bed. Pt inquired about steroids and wants it sent to [facility].   KP

## 2022-05-29 ENCOUNTER — Other Ambulatory Visit: Payer: Self-pay | Admitting: Family Medicine

## 2022-05-29 DIAGNOSIS — N401 Enlarged prostate with lower urinary tract symptoms: Secondary | ICD-10-CM

## 2022-05-30 NOTE — Telephone Encounter (Signed)
Requested medication (s) are due for refill today: yes  Requested medication (s) are on the active medication list: yes  Last refill:  11/17/21 #180/0  Future visit scheduled: yes  Notes to clinic:  Unable to refill per protocol due to failed labs, no updated results.     Requested Prescriptions  Pending Prescriptions Disp Refills   tamsulosin (FLOMAX) 0.4 MG CAPS capsule [Pharmacy Med Name: TAMSULOSIN HCL 0.4 MG CAP] 180 capsule 0    Sig: TAKE (2) CAPSULES BY MOUTH EVERY DAY     Urology: Alpha-Adrenergic Blocker Failed - 05/29/2022 10:28 AM      Failed - PSA in normal range and within 360 days    Prostate Specific Ag, Serum  Date Value Ref Range Status  08/16/2018 0.2 0.0 - 4.0 ng/mL Final    Comment:    Roche ECLIA methodology. According to the American Urological Association, Serum PSA should decrease and remain at undetectable levels after radical prostatectomy. The AUA defines biochemical recurrence as an initial PSA value 0.2 ng/mL or greater followed by a subsequent confirmatory PSA value 0.2 ng/mL or greater. Values obtained with different assay methods or kits cannot be used interchangeably. Results cannot be interpreted as absolute evidence of the presence or absence of malignant disease.          Passed - Last BP in normal range    BP Readings from Last 1 Encounters:  05/11/22 110/68         Passed - Valid encounter within last 12 months    Recent Outpatient Visits           2 weeks ago Influenza   [facility] and Sports Medicine at [facility], Earley Abide, MD   1 month ago Type 2 diabetes mellitus without complication, without long-term current use of insulin ([place])   [place] Primary Care and Sports Medicine at [facility], MD   3 months ago Type 2 diabetes mellitus without complication, without long-term current use of insulin ([place])   [place] Primary Care and Sports Medicine at [facility], MD   5 months ago Mixed hyperlipidemia   [place] Primary Care and Sports Medicine at [facility], MD   9 months ago Type 2 diabetes mellitus without complication, without long-term current use of insulin ([place])   [place] Primary Care and Sports Medicine at [place] [facility] [place], [place], MD       Future Appointments             In 2 months Juline Patch, MD [facility] Primary Care and Sports Medicine at [facility], [facility]

## 2022-06-13 ENCOUNTER — Other Ambulatory Visit: Payer: Self-pay | Admitting: Gastroenterology

## 2022-06-13 DIAGNOSIS — K219 Gastro-esophageal reflux disease without esophagitis: Secondary | ICD-10-CM

## 2022-06-15 ENCOUNTER — Other Ambulatory Visit: Payer: Self-pay | Admitting: Family Medicine

## 2022-06-15 NOTE — Telephone Encounter (Signed)
Requested Prescriptions  Pending Prescriptions Disp Refills   B-D UF III MINI PEN NEEDLES 31G X 5 MM MISC [Pharmacy Med Name: BD UF MINI PEN NEEDLE 5MMX31G] 100 each 2    Sig: EVERY DAY WITH INSULIN     Endocrinology: Diabetes - Testing Supplies Passed - 06/15/2022  5:28 PM      Passed - Valid encounter within last 12 months    Recent Outpatient Visits           1 month ago Influenza   [facility] Primary [facility] and [facility] Medicine at [facility], Earley Abide, MD   1 month ago Type 2 diabetes mellitus without complication, without long-term current use of insulin ([place])   [place] Primary Care and Sports Medicine at [facility], MD   4 months ago Type 2 diabetes mellitus without complication, without long-term current use of insulin ([place])   [place] Primary Care and Sports Medicine at [place], Deanna C, MD   5 months ago Mixed hyperlipidemia   [place] Primary Care and Sports Medicine at [facility], MD   9 months ago Type 2 diabetes mellitus without complication, without long-term current use of insulin ([place])   [place] Primary Care and Sports Medicine at [place], [place], MD       Future Appointments             In 2 months Juline Patch, MD [place] and Sports Medicine at [facility], [facility]

## 2022-06-20 ENCOUNTER — Other Ambulatory Visit: Payer: Self-pay | Admitting: Family Medicine

## 2022-06-20 DIAGNOSIS — E119 Type 2 diabetes mellitus without complications: Secondary | ICD-10-CM

## 2022-07-14 ENCOUNTER — Other Ambulatory Visit: Payer: Self-pay | Admitting: Family Medicine

## 2022-07-14 DIAGNOSIS — E119 Type 2 diabetes mellitus without complications: Secondary | ICD-10-CM

## 2022-07-14 NOTE — Telephone Encounter (Signed)
Requested Prescriptions  Pending Prescriptions Disp Refills   Insulin Glargine (BASAGLAR KWIKPEN) 100 UNIT/ML [Pharmacy Med Name: BASAGLAR 100 UNIT/ML KWIKPEN] 45 mL 1    Sig: INJECT 50 UNITS INTO THE SKIN DAILY     Endocrinology:  Diabetes - Insulins Passed - 07/14/2022  2:31 PM      Passed - HBA1C is between 0 and 7.9 and within 180 days    Hemoglobin A1C  Date Value Ref Range Status  09/26/2012 7.5 (H) 4.2 - 6.3 % Final    Comment:    The American Diabetes Association recommends that a primary goal of therapy should be <7% and that physicians should reevaluate the treatment regimen in patients with HbA1c values consistently >8%.    Hgb A1c MFr Bld  Date Value Ref Range Status  04/24/2022 7.4 (H) 4.8 - 5.6 % Final    Comment:             Prediabetes: 5.7 - 6.4          Diabetes: >6.4          Glycemic control for adults with diabetes: <7.0          Passed - Valid encounter within last 6 months    Recent Outpatient Visits           2 months ago Influenza   [facility] Sports Medicine at [facility], Earley Abide, MD   2 months ago Type 2 diabetes mellitus without complication, without long-term current use of insulin ([place])   [place] Primary Care & Sports Medicine at [facility], MD   5 months ago Type 2 diabetes mellitus without complication, without long-term current use of insulin ([place])   [place] Primary Care & Sports Medicine at [place], Deanna C, MD   6 months ago Mixed hyperlipidemia   [place] Primary Care & Sports Medicine at [facility], MD   10 months ago Type 2 diabetes mellitus without complication, without long-term current use of insulin [facility])   [place] Primary Care & Sports Medicine at [place], [place], MD       Future Appointments             In 1 month Juline Patch, MD [place] at [facility], [facility]

## 2022-07-18 DIAGNOSIS — E119 Type 2 diabetes mellitus without complications: Secondary | ICD-10-CM | POA: Diagnosis not present

## 2022-07-18 DIAGNOSIS — Z794 Long term (current) use of insulin: Secondary | ICD-10-CM | POA: Diagnosis not present

## 2022-08-23 ENCOUNTER — Ambulatory Visit: Payer: 59 | Admitting: Family Medicine

## 2022-08-23 ENCOUNTER — Encounter: Payer: Self-pay | Admitting: Family Medicine

## 2022-08-23 VITALS — BP 120/70 | HR 80 | Ht 66.0 in | Wt 189.0 lb

## 2022-08-23 DIAGNOSIS — I10 Essential (primary) hypertension: Secondary | ICD-10-CM | POA: Diagnosis not present

## 2022-08-23 DIAGNOSIS — K219 Gastro-esophageal reflux disease without esophagitis: Secondary | ICD-10-CM

## 2022-08-23 DIAGNOSIS — E119 Type 2 diabetes mellitus without complications: Secondary | ICD-10-CM

## 2022-08-23 DIAGNOSIS — R350 Frequency of micturition: Secondary | ICD-10-CM

## 2022-08-23 DIAGNOSIS — N401 Enlarged prostate with lower urinary tract symptoms: Secondary | ICD-10-CM

## 2022-08-23 MED ORDER — TAMSULOSIN HCL 0.4 MG PO CAPS: ORAL_CAPSULE | ORAL | 1 refills | Status: DC

## 2022-08-23 MED ORDER — PANTOPRAZOLE SODIUM 40 MG PO TBEC: 40.0000 mg | DELAYED_RELEASE_TABLET | Freq: Two times a day (BID) | ORAL | 1 refills | Status: DC

## 2022-08-23 MED ORDER — LISINOPRIL 5 MG PO TABS: ORAL_TABLET | ORAL | 1 refills | Status: DC

## 2022-08-23 NOTE — Progress Notes (Signed)
Date:  08/23/2022   Name:  Kevin Montgomery   DOB:  1965-10-13   MRN:  [id number]   Chief Complaint: Hypertension, Gastroesophageal Reflux, Benign Prostatic Hypertrophy, and Diabetes (Sees endo, but needs a micro)  Hypertension This is a chronic problem. The current episode started more than 1 year ago. The problem has been gradually improving since onset. The problem is controlled. Pertinent negatives include no anxiety, blurred vision, chest pain, headaches, malaise/fatigue, neck pain, orthopnea, palpitations, peripheral edema, PND, shortness of breath or sweats. There are no associated agents to hypertension. There are no known risk factors for coronary artery disease. Past treatments include ACE inhibitors. The current treatment provides moderate improvement. There are no compliance problems.  There is no history of CAD/MI or CVA. There is no history of chronic renal disease, a hypertension causing med or renovascular disease.  Gastroesophageal Reflux He reports no abdominal pain, no chest pain, no coughing, no heartburn or no wheezing. This is a chronic problem. The current episode started more than 1 year ago. The problem has been gradually improving. The symptoms are aggravated by certain foods. He has tried a PPI for the symptoms. The treatment provided moderate relief.  Benign Prostatic Hypertrophy This is a chronic problem. The current episode started more than 1 year ago. The problem has been gradually improving since onset. Irritative symptoms do not include frequency, nocturia or urgency. Obstructive symptoms do not include incomplete emptying. Past treatments include tamsulosin. The treatment provided moderate relief.  Diabetes He presents for his follow-up diabetic visit. He has type 2 diabetes mellitus. His disease course has been stable. There are no hypoglycemic associated symptoms. Pertinent negatives for hypoglycemia include no headaches or sweats. Pertinent negatives for diabetes  include no blurred vision, no chest pain, no polydipsia and no polyuria. There are no hypoglycemic complications. Symptoms are stable. Pertinent negatives for diabetic complications include no CVA.    Lab Results  Component Value Date   NA 136 04/24/2022   K 4.3 04/24/2022   CO2 20 04/24/2022   GLUCOSE 269 (H) 04/24/2022   BUN 14 04/24/2022   CREATININE 1.03 04/24/2022   CALCIUM 9.5 04/24/2022   EGFR 85 04/24/2022   GFRNONAA 105 08/03/2020   Lab Results  Component Value Date   CHOL 179 12/20/2021   HDL 40 12/20/2021   LDLCALC 108 (H) 12/20/2021   TRIG 178 (H) 12/20/2021   CHOLHDL 4.1 01/27/2020   No results found for: "TSH" Lab Results  Component Value Date   HGBA1C 7.4 (H) 04/24/2022   Lab Results  Component Value Date   WBC 5.7 09/21/2020   HGB 15.5 09/21/2020   HCT 42.0 09/21/2020   MCV 89.2 09/21/2020   PLT 158 09/21/2020   Lab Results  Component Value Date   ALT 37 04/24/2022   AST 18 04/24/2022   ALKPHOS 54 04/24/2022   BILITOT 0.6 04/24/2022   No results found for: "25OHVITD2", "25OHVITD3", "VD25OH"   Review of Systems  Constitutional:  Negative for malaise/fatigue.  HENT:  Negative for trouble swallowing.   Eyes:  Negative for blurred vision.  Respiratory:  Negative for cough, chest tightness, shortness of breath and wheezing.   Cardiovascular:  Negative for chest pain, palpitations, orthopnea and PND.  Gastrointestinal:  Negative for abdominal pain, constipation and heartburn.  Endocrine: Negative for polydipsia and polyuria.  Genitourinary:  Negative for difficulty urinating, frequency, incomplete emptying, nocturia and urgency.  Musculoskeletal:  Negative for neck pain.  Neurological:  Negative for headaches.  Patient Active Problem List   Diagnosis Date Noted   Influenza 05/11/2022   Encounter for screening colonoscopy    Gastroesophageal reflux disease 12/05/2017   Incarcerated umbilical hernia    Umbilical hernia [id number]   Diastasis  recti 10/08/2015   Essential hypertension 04/21/2015   Hyperlipidemia 04/21/2015    No Known Allergies  Past Surgical History:  Procedure Laterality Date   COLONOSCOPY WITH PROPOFOL N/A 12/16/2018   Procedure: COLONOSCOPY WITH PROPOFOL;  Surgeon: Lucilla Lame, MD;  Location: [facility];  Service: Endoscopy;  Laterality: N/A;  Diabetic - insulin and oral meds   ESOPHAGOGASTRODUODENOSCOPY (EGD) WITH PROPOFOL N/A 07/11/2021   Procedure: ESOPHAGOGASTRODUODENOSCOPY (EGD) WITH PROPOFOL;  Surgeon: Lucilla Lame, MD;  Location: [facility];  Service: Endoscopy;  Laterality: N/A;  Diabetic   KIDNEY STONE SURGERY     LITHOTRIPSY     ROTATOR CUFF REPAIR Right [id number]   [facility]   UMBILICAL HERNIA REPAIR N/A 12/20/2015   Procedure: HERNIA REPAIR UMBILICAL ADULT;  Surgeon: Hubbard Robinson, MD;  Location: [facility];  Service: General;  Laterality: N/A;    Social History   Tobacco Use   Smoking status: Never   Smokeless tobacco: Never  Vaping Use   Vaping Use: Never used  Substance Use Topics   Alcohol use: Yes    Alcohol/week: 0.0 standard drinks of alcohol    Comment: rare   Drug use: No     Medication list has been reviewed and updated.  Current Meds  Medication Sig   B-D UF III MINI PEN NEEDLES 31G X 5 MM MISC EVERY DAY WITH INSULIN   Dulaglutide 0.75 MG/0.5ML SOPN Inject into the skin. solum   GLOBAL EASE INJECT PEN NEEDLES 31G X 8 MM MISC USE 1 PENTIP DAILY WITH INSULIN   Insulin Glargine (BASAGLAR KWIKPEN) 100 UNIT/ML INJECT 50 UNITS INTO THE SKIN DAILY (Patient taking differently: Inject 30 Units into the skin daily. Solum)   lisinopril (ZESTRIL) 5 MG tablet TAKE (1) TABLET BY MOUTH EVERY DAY   metFORMIN (GLUCOPHAGE) 500 MG tablet TAKE (1) TABLET BY MOUTH TWICE DAILY (Patient taking differently: 500 in am 1,000 in pm)   pantoprazole (PROTONIX) 40 MG tablet Take 1 tablet (40 mg total) by mouth 2 (two) times daily before a meal. MUST SCHEDULE OFFICE VISIT    primidone (MYSOLINE) 250 MG tablet Take 500 mg by mouth at bedtime. 2 tablets daily- neuro   tamsulosin (FLOMAX) 0.4 MG CAPS capsule TAKE (2) CAPSULES BY MOUTH EVERY DAY   [DISCONTINUED] aspirin 81 MG tablet Take 1 tablet (81 mg total) by mouth daily.       08/23/2022    9:37 AM 04/24/2022    9:13 AM 01/31/2022    7:56 AM 12/20/2021    8:49 AM  GAD 7 : Generalized Anxiety Score  Nervous, Anxious, on Edge 0 0 0 0  Control/stop worrying 0 0 0 0  Worry too much - different things 0 0 0 0  Trouble relaxing 0 0 0 0  Restless 0 0 0 0  Easily annoyed or irritable 0 0 0 0  Afraid - awful might happen 0 0 0 0  Total GAD 7 Score 0 0 0 0  Anxiety Difficulty Not difficult at all Not difficult at all Not difficult at all Not difficult at all       08/23/2022    9:37 AM 04/24/2022    9:13 AM 01/31/2022    7:56 AM  Depression screen PHQ 2/9  Decreased  Interest 0 0 0  Down, Depressed, Hopeless 0 0 0  PHQ - 2 Score 0 0 0  Altered sleeping 0 0 0  Tired, decreased energy 0 0 0  Change in appetite 0 0 0  Feeling bad or failure about yourself  0 0 0  Trouble concentrating 0 0 0  Moving slowly or fidgety/restless 0 0 0  Suicidal thoughts 0 0 0  PHQ-9 Score 0 0 0  Difficult doing work/chores Not difficult at all Not difficult at all Not difficult at all    BP Readings from Last 3 Encounters:  08/23/22 120/70  05/11/22 110/68  04/24/22 120/64    Physical Exam Vitals and nursing note reviewed.  HENT:     Head: Normocephalic.     Right Ear: Tympanic membrane and external ear normal.     Left Ear: Tympanic membrane and external ear normal.     Nose: Nose normal.     Mouth/Throat:     Mouth: Mucous membranes are moist.     Pharynx: Oropharynx is clear.  Eyes:     General: No scleral icterus.       Right eye: No discharge.        Left eye: No discharge.     Conjunctiva/sclera: Conjunctivae normal.     Pupils: Pupils are equal, round, and reactive to light.  Neck:     Thyroid: No  thyromegaly.     Vascular: No JVD.     Trachea: No tracheal deviation.  Cardiovascular:     Rate and Rhythm: Normal rate and regular rhythm.     Heart sounds: Normal heart sounds. No murmur heard.    No friction rub. No gallop.  Pulmonary:     Effort: No respiratory distress.     Breath sounds: Normal breath sounds. No wheezing, rhonchi or rales.  Abdominal:     General: Bowel sounds are normal.     Palpations: Abdomen is soft. There is no mass.     Tenderness: There is no abdominal tenderness. There is no guarding or rebound.  Musculoskeletal:        General: No tenderness. Normal range of motion.     Cervical back: Normal range of motion and neck supple.  Lymphadenopathy:     Cervical: No cervical adenopathy.  Skin:    General: Skin is warm.     Findings: No rash.  Neurological:     Mental Status: He is alert and oriented to person, place, and time.     Cranial Nerves: No cranial nerve deficit.     Deep Tendon Reflexes: Reflexes are normal and symmetric.     Wt Readings from Last 3 Encounters:  08/23/22 189 lb (85.7 kg)  05/11/22 189 lb (85.7 kg)  04/24/22 190 lb (86.2 kg)    BP 120/70   Pulse 80   Ht '5\' 6"'$  (1.676 m)   Wt 189 lb (85.7 kg)   SpO2 98%   BMI 30.51 kg/m   Assessment and Plan:  1. Essential hypertension Chronic.  Controlled.  Stable.  Blood pressure today is 120/70.  Asymptomatic.  Tolerating medication well.  Continue lisinopril 5 mg once a day.  Will recheck in 6 months. - lisinopril (ZESTRIL) 5 MG tablet; TAKE (1) TABLET BY MOUTH EVERY DAY  Dispense: 90 tablet; Refill: 1  2. Gastroesophageal reflux disease Chronic.  Controlled.  Stable.  Continue pantoprazole 40 mg twice a day.  Will recheck in 6 months. - pantoprazole (PROTONIX) 40 MG tablet; Take 1  tablet (40 mg total) by mouth 2 (two) times daily before a meal. MUST SCHEDULE OFFICE VISIT  Dispense: 180 tablet; Refill: 1  3. Benign prostatic hyperplasia with urinary frequency Chronic.   Controlled.  Stable.  Asymptomatic.  Continue tamsulosin 0.4 mg 2 capsules once a day. - tamsulosin (FLOMAX) 0.4 MG CAPS capsule; TAKE (2) CAPSULES BY MOUTH EVERY DAY  Dispense: 180 capsule; Refill: 1  4. Type 2 diabetes mellitus without complication, without long-term current use of insulin (HCC) Followed by Dr. Gabriel Carina doing well with current medical regimen.  Will check microalbuminuria for surveillance of diabetic nephropathy. - Microalbumin / creatinine urine ratio    Otilio Miu, MD

## 2022-08-24 ENCOUNTER — Other Ambulatory Visit: Payer: Self-pay | Admitting: Family Medicine

## 2022-08-24 DIAGNOSIS — N401 Enlarged prostate with lower urinary tract symptoms: Secondary | ICD-10-CM

## 2022-08-24 NOTE — Telephone Encounter (Signed)
tamsulosin (FLOMAX) 0.4 MG CAPS capsule 180 capsule 1 08/23/2022    Sig: TAKE (2) CAPSULES BY MOUTH EVERY DAY   Sent to pharmacy as: tamsulosin (FLOMAX) 0.4 MG Cap capsule   E-Prescribing Status: Receipt confirmed by pharmacy (08/23/2022  9:52 AM EDT)    Requested Prescriptions  Pending Prescriptions Disp Refills   tamsulosin (FLOMAX) 0.4 MG CAPS capsule [Pharmacy Med Name: TAMSULOSIN HCL 0.4 MG CAP] 180 capsule 1    Sig: TAKE (2) [place] DAY     Urology: Alpha-Adrenergic Blocker Failed - 08/24/2022 11:56 AM      Failed - PSA in normal range and within 360 days    Prostate Specific Ag, Serum  Date Value Ref Range Status  08/16/2018 0.2 0.0 - 4.0 ng/mL Final    Comment:    Roche ECLIA methodology. According to the American Urological Association, Serum PSA should decrease and remain at undetectable levels after radical prostatectomy. The AUA defines biochemical recurrence as an initial PSA value 0.2 ng/mL or greater followed by a subsequent confirmatory PSA value 0.2 ng/mL or greater. Values obtained with different assay methods or kits cannot be used interchangeably. Results cannot be interpreted as absolute evidence of the presence or absence of malignant disease.          Passed - Last BP in normal range    BP Readings from Last 1 Encounters:  08/23/22 120/70         Passed - Valid encounter within last 12 months    Recent Outpatient Visits           Yesterday Essential hypertension   [facility] [place], Deanna C, MD   3 months ago Influenza   [facility], Earley Abide, MD   4 months ago Type 2 diabetes mellitus without complication, without long-term current use of insulin ([place])   [facility] at [facility], MD   6 months ago Type 2 diabetes mellitus without complication, without long-term current use  of insulin ([place])   [facility] [place], [place], MD   8 months ago Mixed hyperlipidemia   [facility] [place] [facility] [place], [place], MD       Future Appointments             In 6 months Juline Patch, MD [place] at [facility], [facility]

## 2022-09-12 DIAGNOSIS — M5030 Other cervical disc degeneration, unspecified cervical region: Secondary | ICD-10-CM | POA: Diagnosis not present

## 2022-09-12 DIAGNOSIS — M47812 Spondylosis without myelopathy or radiculopathy, cervical region: Secondary | ICD-10-CM | POA: Diagnosis not present

## 2022-09-12 DIAGNOSIS — Z9889 Other specified postprocedural states: Secondary | ICD-10-CM | POA: Diagnosis not present

## 2022-09-12 DIAGNOSIS — M19011 Primary osteoarthritis, right shoulder: Secondary | ICD-10-CM | POA: Diagnosis not present

## 2022-09-12 DIAGNOSIS — G8929 Other chronic pain: Secondary | ICD-10-CM | POA: Diagnosis not present

## 2022-09-12 DIAGNOSIS — M25511 Pain in right shoulder: Secondary | ICD-10-CM | POA: Diagnosis not present

## 2022-10-04 ENCOUNTER — Encounter: Payer: Self-pay | Admitting: Family Medicine

## 2022-10-04 ENCOUNTER — Ambulatory Visit: Payer: 59 | Admitting: Family Medicine

## 2022-10-04 VITALS — BP 128/76 | HR 91 | Temp 98.2°F | Ht 66.0 in | Wt 185.0 lb

## 2022-10-04 DIAGNOSIS — J0100 Acute maxillary sinusitis, unspecified: Secondary | ICD-10-CM

## 2022-10-04 MED ORDER — AZITHROMYCIN 250 MG PO TABS: ORAL_TABLET | ORAL | 0 refills | Status: AC

## 2022-10-04 NOTE — Progress Notes (Signed)
Date:  10/04/2022   Name:  Kevin Montgomery   DOB:  Jul 21, 1965   MRN:  [id number]   Chief Complaint: Sore Throat (Congestion, facial pressure)  Sore Throat  This is a new problem. The current episode started in the past 7 days. The problem has been waxing and waning. Neither side of throat is experiencing more pain than the other. There has been no fever. Associated symptoms include coughing and diarrhea. Pertinent negatives include no abdominal pain, ear discharge, ear pain, hoarse voice, neck pain, shortness of breath, stridor, swollen glands or trouble swallowing. He has had no exposure to strep or mono. He has tried NSAIDs for the symptoms. The treatment provided moderate relief.    Lab Results  Component Value Date   NA 136 04/24/2022   K 4.3 04/24/2022   CO2 20 04/24/2022   GLUCOSE 269 (H) 04/24/2022   BUN 14 04/24/2022   CREATININE 1.03 04/24/2022   CALCIUM 9.5 04/24/2022   EGFR 85 04/24/2022   GFRNONAA 105 08/03/2020   Lab Results  Component Value Date   CHOL 179 12/20/2021   HDL 40 12/20/2021   LDLCALC 108 (H) 12/20/2021   TRIG 178 (H) 12/20/2021   CHOLHDL 4.1 01/27/2020   No results found for: "TSH" Lab Results  Component Value Date   HGBA1C 7.4 (H) 04/24/2022   Lab Results  Component Value Date   WBC 5.7 09/21/2020   HGB 15.5 09/21/2020   HCT 42.0 09/21/2020   MCV 89.2 09/21/2020   PLT 158 09/21/2020   Lab Results  Component Value Date   ALT 37 04/24/2022   AST 18 04/24/2022   ALKPHOS 54 04/24/2022   BILITOT 0.6 04/24/2022   No results found for: "25OHVITD2", "25OHVITD3", "VD25OH"   Review of Systems  HENT:  Negative for ear discharge, ear pain, hoarse voice, nosebleeds, postnasal drip, rhinorrhea, sinus pressure and trouble swallowing.   Respiratory:  Positive for cough. Negative for shortness of breath, wheezing and stridor.   Cardiovascular:  Negative for chest pain, palpitations and leg swelling.  Gastrointestinal:  Positive for diarrhea.  Negative for abdominal pain, blood in stool and constipation.  Musculoskeletal:  Negative for neck pain.    Patient Active Problem List   Diagnosis Date Noted   Influenza 05/11/2022   Encounter for screening colonoscopy    Gastroesophageal reflux disease 12/05/2017   Incarcerated umbilical hernia    Umbilical hernia 10/08/2015   Diastasis recti 10/08/2015   Essential hypertension 04/21/2015   Hyperlipidemia 04/21/2015    No Known Allergies  Past Surgical History:  Procedure Laterality Date   COLONOSCOPY WITH PROPOFOL N/A 12/16/2018   Procedure: COLONOSCOPY WITH PROPOFOL;  Surgeon: Midge Minium, MD;  [facility];  Service: Endoscopy;  Laterality: N/A;  Diabetic - insulin and oral meds   ESOPHAGOGASTRODUODENOSCOPY (EGD) WITH PROPOFOL N/A 07/11/2021   Procedure: ESOPHAGOGASTRODUODENOSCOPY (EGD) WITH PROPOFOL;  Surgeon: Midge Minium, MD;  Location: [facility];  Service: Endoscopy;  Laterality: N/A;  Diabetic   KIDNEY STONE SURGERY     LITHOTRIPSY     ROTATOR CUFF REPAIR Right 03/23/2020   [facility]   UMBILICAL HERNIA REPAIR N/A 12/20/2015   Procedure: HERNIA REPAIR UMBILICAL ADULT;  Surgeon: Gladis Riffle, MD;  Location: [facility];  Service: General;  Laterality: N/A;    Social History   Tobacco Use   Smoking status: Never   Smokeless tobacco: Never  Vaping Use   Vaping Use: Never used  Substance Use Topics   Alcohol  use: Yes    Alcohol/week: 0.0 standard drinks of alcohol    Comment: rare   Drug use: No     Medication list has been reviewed and updated.  Current Meds  Medication Sig   atorvastatin (LIPITOR) 10 MG tablet Take 10 mg by mouth daily.   B-D UF III MINI PEN NEEDLES 31G X 5 MM MISC EVERY DAY WITH INSULIN   GLOBAL EASE INJECT PEN NEEDLES 31G X 8 MM MISC USE 1 PENTIP DAILY WITH INSULIN   lisinopril (ZESTRIL) 5 MG tablet TAKE (1) TABLET BY MOUTH EVERY DAY   metFORMIN (GLUCOPHAGE) 500 MG tablet TAKE (1) TABLET BY MOUTH TWICE DAILY  (Patient taking differently: 500 in am 1,000 in pm)   pantoprazole (PROTONIX) 40 MG tablet Take 1 tablet (40 mg total) by mouth 2 (two) times daily before a meal. MUST SCHEDULE OFFICE VISIT   primidone (MYSOLINE) 250 MG tablet Take 500 mg by mouth at bedtime. 2 tablets daily- neuro   tamsulosin (FLOMAX) 0.4 MG CAPS capsule TAKE (2) CAPSULES BY MOUTH EVERY DAY   TRULICITY 0.75 MG/0.5ML SOPN Inject into the skin.   [DISCONTINUED] Insulin Glargine (BASAGLAR KWIKPEN) 100 UNIT/ML INJECT 50 UNITS INTO THE SKIN DAILY (Patient taking differently: Inject 30 Units into the skin daily. Solum)       10/04/2022    9:47 AM 08/23/2022    9:37 AM 04/24/2022    9:13 AM 01/31/2022    7:56 AM  GAD 7 : Generalized Anxiety Score  Nervous, Anxious, on Edge 0 0 0 0  Control/stop worrying 0 0 0 0  Worry too much - different things 0 0 0 0  Trouble relaxing 0 0 0 0  Restless 0 0 0 0  Easily annoyed or irritable 0 0 0 0  Afraid - awful might happen 0 0 0 0  Total GAD 7 Score 0 0 0 0  Anxiety Difficulty Not difficult at all Not difficult at all Not difficult at all Not difficult at all       10/04/2022    9:47 AM 08/23/2022    9:37 AM 04/24/2022    9:13 AM  Depression screen PHQ 2/9  Decreased Interest 0 0 0  Down, Depressed, Hopeless 0 0 0  PHQ - 2 Score 0 0 0  Altered sleeping 0 0 0  Tired, decreased energy 0 0 0  Change in appetite 0 0 0  Feeling bad or failure about yourself  0 0 0  Trouble concentrating 0 0 0  Moving slowly or fidgety/restless 0 0 0  Suicidal thoughts 0 0 0  PHQ-9 Score 0 0 0  Difficult doing work/chores Not difficult at all Not difficult at all Not difficult at all    BP Readings from Last 3 Encounters:  10/04/22 128/76  08/23/22 120/70  05/11/22 110/68    Physical Exam Vitals and nursing note reviewed.  HENT:     Head: Normocephalic.     Right Ear: Hearing, tympanic membrane, ear canal and external ear normal. Tympanic membrane is not retracted.     Left Ear: Hearing,  tympanic membrane, ear canal and external ear normal. Tympanic membrane is not retracted.     Nose: Congestion present. No rhinorrhea.     Mouth/Throat:     Pharynx: Oropharynx is clear. Posterior oropharyngeal erythema present. No pharyngeal swelling or oropharyngeal exudate.  Eyes:     General: No scleral icterus.       Right eye: No discharge.  Left eye: No discharge.     Conjunctiva/sclera: Conjunctivae normal.     Pupils: Pupils are equal, round, and reactive to light.  Neck:     Thyroid: No thyromegaly.     Vascular: Normal carotid pulses. No carotid bruit, hepatojugular reflux or JVD.     Trachea: Trachea normal. No tracheal deviation.  Cardiovascular:     Rate and Rhythm: Normal rate and regular rhythm.     Heart sounds: Normal heart sounds. No murmur heard.    No friction rub. No gallop.  Pulmonary:     Effort: No respiratory distress.     Breath sounds: Normal breath sounds. No wheezing or rales.  Abdominal:     General: Bowel sounds are normal.     Palpations: Abdomen is soft. There is no mass.     Tenderness: There is no abdominal tenderness. There is no guarding or rebound.  Musculoskeletal:        General: No tenderness. Normal range of motion.     Cervical back: Normal range of motion and neck supple.  Lymphadenopathy:     Cervical: No cervical adenopathy.     Right cervical: No superficial, deep or posterior cervical adenopathy.    Left cervical: No superficial, deep or posterior cervical adenopathy.  Skin:    General: Skin is warm.     Findings: No rash.  Neurological:     Mental Status: He is alert and oriented to person, place, and time.     Cranial Nerves: No cranial nerve deficit.     Deep Tendon Reflexes: Reflexes are normal and symmetric.     Wt Readings from Last 3 Encounters:  10/04/22 185 lb (83.9 kg)  08/23/22 189 lb (85.7 kg)  05/11/22 189 lb (85.7 kg)    BP 128/76   Pulse 91   Temp 98.2 F (36.8 C) (Oral)   Ht  (1.676 m)    Wt 185 lb (83.9 kg)   SpO2 98%   BMI 29.86 kg/m   Assessment and Plan: 1. Acute maxillary sinusitis, recurrence not specified Chronic.  Controlled.  Stable.  Tenderness is noted over the maxillary sinuses bilateral.  Some postnasal drainage is noted and we will treat for likely maxillary sinus sinusitis with azithromycin to 50 mg 2 today followed by 1 a day for 4 days. - azithromycin (ZITHROMAX) 250 MG tablet; Take 2 tablets on day 1, then 1 tablet daily on days 2 through 5  Dispense: 6 tablet; Refill: 0     Elizabeth Sauer, MD

## 2022-10-11 DIAGNOSIS — M5031 Other cervical disc degeneration,  high cervical region: Secondary | ICD-10-CM | POA: Diagnosis not present

## 2022-10-11 DIAGNOSIS — M47812 Spondylosis without myelopathy or radiculopathy, cervical region: Secondary | ICD-10-CM | POA: Diagnosis not present

## 2022-10-11 DIAGNOSIS — M4802 Spinal stenosis, cervical region: Secondary | ICD-10-CM | POA: Diagnosis not present

## 2022-10-11 DIAGNOSIS — M5030 Other cervical disc degeneration, unspecified cervical region: Secondary | ICD-10-CM | POA: Diagnosis not present

## 2022-10-11 DIAGNOSIS — M5021 Other cervical disc displacement,  high cervical region: Secondary | ICD-10-CM | POA: Diagnosis not present

## 2022-10-13 DIAGNOSIS — M542 Cervicalgia: Secondary | ICD-10-CM | POA: Diagnosis not present

## 2022-10-13 DIAGNOSIS — M5412 Radiculopathy, cervical region: Secondary | ICD-10-CM | POA: Diagnosis not present

## 2022-10-17 DIAGNOSIS — Z794 Long term (current) use of insulin: Secondary | ICD-10-CM | POA: Diagnosis not present

## 2022-10-17 DIAGNOSIS — E119 Type 2 diabetes mellitus without complications: Secondary | ICD-10-CM | POA: Diagnosis not present

## 2022-10-25 ENCOUNTER — Telehealth: Payer: Self-pay | Admitting: Family Medicine

## 2022-10-25 NOTE — Telephone Encounter (Signed)
Copied from CRM [id number]. Topic: Referral - Question >> Oct 25, 2022 11:54 AM Pincus Sanes wrote: Reason for CRM: Kevin Montgomery states he got a cb from [place] and was to return call, Kevin Montgomery at lunch and pt states no problem but pls give him a fu call.  [phone]

## 2022-10-25 NOTE — Telephone Encounter (Signed)
Referral Request - Has patient seen PCP for this complaint? yes *If NO, is insurance requiring patient see PCP for this issue before PCP can refer them? Referral for which specialty: for his blood sugar Preferred provider/office: [facility] Reason for referral: for his blood sugar, patient doesn't like going to [facility] and is requesting above location

## 2022-11-07 DIAGNOSIS — G250 Essential tremor: Secondary | ICD-10-CM | POA: Diagnosis not present

## 2023-01-09 DIAGNOSIS — E119 Type 2 diabetes mellitus without complications: Secondary | ICD-10-CM | POA: Diagnosis not present

## 2023-01-09 DIAGNOSIS — Z794 Long term (current) use of insulin: Secondary | ICD-10-CM | POA: Diagnosis not present

## 2023-01-09 LAB — HM DIABETES FOOT EXAM: HM Diabetic Foot Exam: NORMAL

## 2023-01-09 LAB — LIPID PANEL
Cholesterol: 136 (ref 0–200)
HDL: 40 (ref 35–70)
LDL Cholesterol: 71
Triglycerides: 127 (ref 40–160)

## 2023-01-09 LAB — MICROALBUMIN / CREATININE URINE RATIO: Microalb Creat Ratio: 7.4

## 2023-01-09 LAB — BASIC METABOLIC PANEL
BUN: 18 (ref 4–21)
Creatinine: 0.9 (ref 0.6–1.3)
Glucose: 122
Potassium: 4.1 mEq/L (ref 3.5–5.1)
Sodium: 139 (ref 137–147)

## 2023-01-09 LAB — HEMOGLOBIN A1C: Hemoglobin A1C: 6.8

## 2023-01-09 LAB — MICROALBUMIN, URINE: Microalb, Ur: 8

## 2023-01-12 ENCOUNTER — Ambulatory Visit: Payer: 59 | Admitting: Family Medicine

## 2023-01-12 ENCOUNTER — Ambulatory Visit
Admission: RE | Admit: 2023-01-12 | Discharge: 2023-01-12 | Disposition: A | Discharge status: Home / Self Care | Payer: 59 | Source: Ambulatory Visit | Attending: Family Medicine | Admitting: Family Medicine

## 2023-01-12 ENCOUNTER — Encounter: Payer: Self-pay | Admitting: Family Medicine

## 2023-01-12 ENCOUNTER — Ambulatory Visit
Admission: RE | Admit: 2023-01-12 | Discharge: 2023-01-12 | Disposition: A | Discharge status: Home / Self Care | Payer: 59 | Attending: Family Medicine | Admitting: Family Medicine

## 2023-01-12 VITALS — BP 122/78 | HR 82 | Ht 66.0 in | Wt 180.0 lb

## 2023-01-12 DIAGNOSIS — M545 Low back pain, unspecified: Secondary | ICD-10-CM | POA: Diagnosis not present

## 2023-01-12 DIAGNOSIS — M519 Unspecified thoracic, thoracolumbar and lumbosacral intervertebral disc disorder: Secondary | ICD-10-CM | POA: Diagnosis not present

## 2023-01-12 MED ORDER — TRAMADOL HCL 50 MG PO TABS
50.0000 mg | ORAL_TABLET | Freq: Three times a day (TID) | ORAL | 0 refills | Status: AC | PRN
Start: 2023-01-12 — End: 2023-01-17

## 2023-01-12 MED ORDER — PREDNISONE 10 MG PO TABS
ORAL_TABLET | ORAL | 1 refills | Status: DC
Start: 2023-01-12 — End: 2023-03-01

## 2023-01-12 NOTE — Progress Notes (Signed)
Date:  01/12/2023   Name:  Kevin Montgomery   DOB:  1965/09/22   MRN:  [id number]   Chief Complaint: Back Pain (Lifting 20lb box and felt something pull in lower back)  Back Pain This is a new problem. The problem is unchanged. The pain is present in the lumbar spine. The pain does not radiate. The pain is at a severity of 7/10. The pain is moderate. The symptoms are aggravated by sitting, twisting and standing. Pertinent negatives include no abdominal pain, bladder incontinence, bowel incontinence, chest pain, numbness, paresis, paresthesias, perianal numbness, tingling or weakness. He has tried NSAIDs for the symptoms.    Lab Results  Component Value Date   NA 139 01/09/2023   K 4.1 01/09/2023   CO2 20 04/24/2022   GLUCOSE 269 (H) 04/24/2022   BUN 18 01/09/2023   CREATININE 0.9 01/09/2023   CALCIUM 9.5 04/24/2022   EGFR 85 04/24/2022   GFRNONAA 105 08/03/2020   Lab Results  Component Value Date   CHOL 136 01/09/2023   HDL 40 01/09/2023   LDLCALC 71 01/09/2023   TRIG 127 01/09/2023   CHOLHDL 4.1 01/27/2020   No results found for: "TSH" Lab Results  Component Value Date   HGBA1C 6.8 01/09/2023   Lab Results  Component Value Date   WBC 5.7 09/21/2020   HGB 15.5 09/21/2020   HCT 42.0 09/21/2020   MCV 89.2 09/21/2020   PLT 158 09/21/2020   Lab Results  Component Value Date   ALT 37 04/24/2022   AST 18 04/24/2022   ALKPHOS 54 04/24/2022   BILITOT 0.6 04/24/2022   No results found for: "25OHVITD2", "25OHVITD3", "VD25OH"   Review of Systems  Respiratory:  Negative for chest tightness, shortness of breath and wheezing.   Cardiovascular:  Negative for chest pain and palpitations.  Gastrointestinal:  Negative for abdominal pain and bowel incontinence.  Genitourinary:  Negative for bladder incontinence, difficulty urinating, flank pain and hematuria.  Musculoskeletal:  Positive for back pain. Negative for arthralgias.  Neurological:  Negative for tingling, weakness,  numbness and paresthesias.    Patient Active Problem List   Diagnosis Date Noted   Influenza 05/11/2022   Encounter for screening colonoscopy    Gastroesophageal reflux disease 12/05/2017   Incarcerated umbilical hernia    Umbilical hernia 10/08/2015   Diastasis recti 10/08/2015   Essential hypertension 04/21/2015   Hyperlipidemia 04/21/2015    No Known Allergies  Past Surgical History:  Procedure Laterality Date   COLONOSCOPY WITH PROPOFOL N/A 12/16/2018   Procedure: COLONOSCOPY WITH PROPOFOL;  Surgeon: Midge Minium, MD;  Location: [facility];  Service: Endoscopy;  Laterality: N/A;  Diabetic - insulin and oral meds   ESOPHAGOGASTRODUODENOSCOPY (EGD) WITH PROPOFOL N/A 07/11/2021   Procedure: ESOPHAGOGASTRODUODENOSCOPY (EGD) WITH PROPOFOL;  Surgeon: Midge Minium, MD;  Location: [facility];  Service: Endoscopy;  Laterality: N/A;  Diabetic   KIDNEY STONE SURGERY     LITHOTRIPSY     ROTATOR CUFF REPAIR Right 03/23/2020   [facility]   UMBILICAL HERNIA REPAIR N/A 12/20/2015   Procedure: HERNIA REPAIR UMBILICAL ADULT;  Surgeon: Gladis Riffle, MD;  Location: [facility];  Service: General;  Laterality: N/A;    Social History   Tobacco Use   Smoking status: Never   Smokeless tobacco: Never  Vaping Use   Vaping status: Never Used  Substance Use Topics   Alcohol use: Yes    Alcohol/week: 0.0 standard drinks of alcohol    Comment: rare  Drug use: No     Medication list has been reviewed and updated.  Current Meds  Medication Sig   atorvastatin (LIPITOR) 10 MG tablet Take 10 mg by mouth daily.   B-D UF III MINI PEN NEEDLES 31G X 5 MM MISC EVERY DAY WITH INSULIN   GLOBAL EASE INJECT PEN NEEDLES 31G X 8 MM MISC USE 1 PENTIP DAILY WITH INSULIN   Insulin Glargine (BASAGLAR KWIKPEN) 100 UNIT/ML 40 Units at bedtime. endo   JARDIANCE 25 MG TABS tablet Take 25 mg by mouth daily.   lisinopril (ZESTRIL) 5 MG tablet TAKE (1) TABLET BY MOUTH EVERY DAY   metFORMIN  (GLUCOPHAGE) 500 MG tablet TAKE (1) TABLET BY MOUTH TWICE DAILY (Patient taking differently: 500 in am 500 in pm)   pantoprazole (PROTONIX) 40 MG tablet Take 1 tablet (40 mg total) by mouth 2 (two) times daily before a meal. MUST SCHEDULE OFFICE VISIT   primidone (MYSOLINE) 250 MG tablet Take 500 mg by mouth at bedtime. 2 tablets daily- neuro   tamsulosin (FLOMAX) 0.4 MG CAPS capsule TAKE (2) CAPSULES BY MOUTH EVERY DAY   [DISCONTINUED] TRULICITY 0.75 MG/0.5ML SOPN Inject into the skin.       10/04/2022    9:47 AM 08/23/2022    9:37 AM 04/24/2022    9:13 AM 01/31/2022    7:56 AM  GAD 7 : Generalized Anxiety Score  Nervous, Anxious, on Edge 0 0 0 0  Control/stop worrying 0 0 0 0  Worry too much - different things 0 0 0 0  Trouble relaxing 0 0 0 0  Restless 0 0 0 0  Easily annoyed or irritable 0 0 0 0  Afraid - awful might happen 0 0 0 0  Total GAD 7 Score 0 0 0 0  Anxiety Difficulty Not difficult at all Not difficult at all Not difficult at all Not difficult at all       10/04/2022    9:47 AM 08/23/2022    9:37 AM 04/24/2022    9:13 AM  Depression screen PHQ 2/9  Decreased Interest 0 0 0  Down, Depressed, Hopeless 0 0 0  PHQ - 2 Score 0 0 0  Altered sleeping 0 0 0  Tired, decreased energy 0 0 0  Change in appetite 0 0 0  Feeling bad or failure about yourself  0 0 0  Trouble concentrating 0 0 0  Moving slowly or fidgety/restless 0 0 0  Suicidal thoughts 0 0 0  PHQ-9 Score 0 0 0  Difficult doing work/chores Not difficult at all Not difficult at all Not difficult at all    BP Readings from Last 3 Encounters:  01/12/23 122/78  10/04/22 128/76  08/23/22 120/70    Physical Exam Vitals and nursing note reviewed.  HENT:     Head: Normocephalic.     Right Ear: External ear normal.     Left Ear: External ear normal.     Nose: Nose normal.  Eyes:     General: No scleral icterus.       Right eye: No discharge.        Left eye: No discharge.     Conjunctiva/sclera:  Conjunctivae normal.     Pupils: Pupils are equal, round, and reactive to light.  Neck:     Thyroid: No thyromegaly.     Vascular: No JVD.     Trachea: No tracheal deviation.  Cardiovascular:     Rate and Rhythm: Normal rate and regular rhythm.  Heart sounds: Normal heart sounds. No murmur heard.    No friction rub. No gallop.  Pulmonary:     Effort: No respiratory distress.     Breath sounds: Normal breath sounds. No wheezing or rales.  Abdominal:     General: Bowel sounds are normal.     Palpations: Abdomen is soft. There is no mass.     Tenderness: There is no abdominal tenderness. There is no guarding or rebound.  Musculoskeletal:        General: No tenderness.     Cervical back: Normal range of motion and neck supple.     Lumbar back: Spasms present. No bony tenderness. Positive right straight leg raise test. Negative left straight leg raise test. No scoliosis.  Lymphadenopathy:     Cervical: No cervical adenopathy.  Skin:    General: Skin is warm.     Findings: Rash present.  Neurological:     Mental Status: He is alert.     Sensory: Sensation is intact.     Motor: Motor function is intact.     Deep Tendon Reflexes: Reflexes are normal and symmetric.     Reflex Scores:      Patellar reflexes are 2+ on the right side and 2+ on the left side.    Wt Readings from Last 3 Encounters:  01/12/23 180 lb (81.6 kg)  10/04/22 185 lb (83.9 kg)  08/23/22 189 lb (85.7 kg)    BP 122/78   Pulse 82   Ht 5\' 6"  (1.676 m)   Wt 180 lb (81.6 kg)   SpO2 97%   BMI 29.05 kg/m   Assessment and Plan: 1. Lumbar disc disease New onset.  Persistent.  Unable to walk stand or sit for long periods of time.  Patient is using a elastic band which afforded some relief.  Patient has a history of cervical disc disease which has had to use prednisone in the past so we will repeat a tapering dose of prednisone in the following prednisone 10 mg taper for [id number].  We will obtain an LS spine  to rule out any compression fracture that may have contributed to this or any other osteolytic/osteoblastic concerns.  Patient is also being given a 5-day course of tramadol 50 mg to be used every 8 hours for severe pain. - DG Lumbar Spine Complete     Elizabeth Sauer, MD

## 2023-01-16 DIAGNOSIS — E119 Type 2 diabetes mellitus without complications: Secondary | ICD-10-CM | POA: Diagnosis not present

## 2023-01-16 DIAGNOSIS — Z794 Long term (current) use of insulin: Secondary | ICD-10-CM | POA: Diagnosis not present

## 2023-02-26 ENCOUNTER — Other Ambulatory Visit: Payer: Self-pay | Admitting: Family Medicine

## 2023-02-26 DIAGNOSIS — N401 Enlarged prostate with lower urinary tract symptoms: Secondary | ICD-10-CM

## 2023-02-26 DIAGNOSIS — K219 Gastro-esophageal reflux disease without esophagitis: Secondary | ICD-10-CM

## 2023-03-01 ENCOUNTER — Encounter: Payer: Self-pay | Admitting: Family Medicine

## 2023-03-01 ENCOUNTER — Ambulatory Visit: Payer: 59 | Admitting: Family Medicine

## 2023-03-01 VITALS — BP 118/68 | HR 80 | Ht 66.0 in | Wt 183.0 lb

## 2023-03-01 DIAGNOSIS — N401 Enlarged prostate with lower urinary tract symptoms: Secondary | ICD-10-CM

## 2023-03-01 DIAGNOSIS — K219 Gastro-esophageal reflux disease without esophagitis: Secondary | ICD-10-CM | POA: Diagnosis not present

## 2023-03-01 DIAGNOSIS — I10 Essential (primary) hypertension: Secondary | ICD-10-CM | POA: Diagnosis not present

## 2023-03-01 DIAGNOSIS — R350 Frequency of micturition: Secondary | ICD-10-CM | POA: Diagnosis not present

## 2023-03-01 MED ORDER — PANTOPRAZOLE SODIUM 40 MG PO TBEC
40.0000 mg | DELAYED_RELEASE_TABLET | Freq: Two times a day (BID) | ORAL | 1 refills | Status: DC
Start: 2023-03-01 — End: 2023-05-14

## 2023-03-01 MED ORDER — TAMSULOSIN HCL 0.4 MG PO CAPS
ORAL_CAPSULE | ORAL | 1 refills | Status: DC
Start: 2023-03-01 — End: 2023-08-14

## 2023-03-01 MED ORDER — LISINOPRIL 5 MG PO TABS
ORAL_TABLET | ORAL | 1 refills | Status: DC
Start: 2023-03-01 — End: 2023-08-14

## 2023-03-01 NOTE — Progress Notes (Signed)
Date:  03/01/2023   Name:  Kevin Montgomery   DOB:  20-Sep-1965   MRN:  [id number]   Chief Complaint: Benign Prostatic Hypertrophy, Gastroesophageal Reflux, and Hypertension  Benign Prostatic Hypertrophy This is a chronic problem. The current episode started more than 1 year ago. The problem has been gradually improving since onset. Irritative symptoms do not include frequency, nocturia or urgency. Obstructive symptoms do not include incomplete emptying, an intermittent stream or a weak stream. nocturia. Pertinent negatives include no chills or nausea. Nothing aggravates the symptoms. Past treatments include tamsulosin. The treatment provided moderate relief.  Gastroesophageal Reflux He reports no abdominal pain, no chest pain, no choking, no dysphagia, no heartburn, no nausea or no wheezing. This is a chronic problem. The current episode started more than 1 year ago. The problem has been gradually improving. The symptoms are aggravated by certain foods. Pertinent negatives include no fatigue. He has tried a PPI for the symptoms. The treatment provided moderate relief.  Hypertension This is a chronic problem. The current episode started more than 1 year ago. The problem has been gradually improving since onset. The problem is controlled. Pertinent negatives include no blurred vision, chest pain, headaches, orthopnea, palpitations, peripheral edema or shortness of breath. Risk factors for coronary artery disease include dyslipidemia. There are no compliance problems.  There is no history of angina, CAD/MI or CVA. There is no history of chronic renal disease, a hypertension causing med or renovascular disease.    Lab Results  Component Value Date   NA 139 01/09/2023   K 4.1 01/09/2023   CO2 20 04/24/2022   GLUCOSE 269 (H) 04/24/2022   BUN 18 01/09/2023   CREATININE 0.9 01/09/2023   CALCIUM 9.5 04/24/2022   EGFR 85 04/24/2022   GFRNONAA 105 08/03/2020   Lab Results  Component Value Date    CHOL 136 01/09/2023   HDL 40 01/09/2023   LDLCALC 71 01/09/2023   TRIG 127 01/09/2023   CHOLHDL 4.1 01/27/2020   No results found for: "TSH" Lab Results  Component Value Date   HGBA1C 6.8 01/09/2023   Lab Results  Component Value Date   WBC 5.7 09/21/2020   HGB 15.5 09/21/2020   HCT 42.0 09/21/2020   MCV 89.2 09/21/2020   PLT 158 09/21/2020   Lab Results  Component Value Date   ALT 37 04/24/2022   AST 18 04/24/2022   ALKPHOS 54 04/24/2022   BILITOT 0.6 04/24/2022   No results found for: "25OHVITD2", "25OHVITD3", "VD25OH"   Review of Systems  Constitutional:  Negative for chills, fatigue and unexpected weight change.  HENT:  Negative for trouble swallowing.   Eyes:  Negative for blurred vision.  Respiratory:  Negative for choking, shortness of breath and wheezing.   Cardiovascular:  Negative for chest pain, palpitations and orthopnea.  Gastrointestinal:  Negative for abdominal pain, blood in stool, dysphagia, heartburn and nausea.  Endocrine: Negative for polydipsia and polyuria.  Genitourinary:  Negative for frequency, incomplete emptying, nocturia and urgency.  Neurological:  Negative for headaches.  Hematological:  Negative for adenopathy.    Patient Active Problem List   Diagnosis Date Noted  . Influenza 05/11/2022  . Encounter for screening colonoscopy   . Gastroesophageal reflux disease 12/05/2017  . Incarcerated umbilical hernia   . Umbilical hernia 10/08/2015  . Diastasis recti 10/08/2015  . Essential hypertension 04/21/2015  . Hyperlipidemia 04/21/2015    No Known Allergies  Past Surgical History:  Procedure Laterality Date  . COLONOSCOPY WITH PROPOFOL  N/A 12/16/2018   Procedure: COLONOSCOPY WITH PROPOFOL;  Surgeon: Midge Minium, MD;  Location: [facility];  Service: Endoscopy;  Laterality: N/A;  Diabetic - insulin and oral meds  . ESOPHAGOGASTRODUODENOSCOPY (EGD) WITH PROPOFOL N/A 07/11/2021   Procedure: ESOPHAGOGASTRODUODENOSCOPY (EGD) WITH  PROPOFOL;  Surgeon: Midge Minium, MD;  Location: [facility];  Service: Endoscopy;  Laterality: N/A;  Diabetic  . KIDNEY STONE SURGERY    . LITHOTRIPSY    . ROTATOR CUFF REPAIR Right 03/23/2020   [facility]  . UMBILICAL HERNIA REPAIR N/A 12/20/2015   Procedure: HERNIA REPAIR UMBILICAL ADULT;  Surgeon: Gladis Riffle, MD;  Location: [facility];  Service: General;  Laterality: N/A;    Social History   Tobacco Use  . Smoking status: Never  . Smokeless tobacco: Never  Vaping Use  . Vaping status: Never Used  Substance Use Topics  . Alcohol use: Yes    Alcohol/week: 0.0 standard drinks of alcohol    Comment: rare  . Drug use: No     Medication list has been reviewed and updated.  Current Meds  Medication Sig  . atorvastatin (LIPITOR) 10 MG tablet Take 10 mg by mouth daily.  . B-D UF III MINI PEN NEEDLES 31G X 5 MM MISC EVERY DAY WITH INSULIN  . GLOBAL EASE INJECT PEN NEEDLES 31G X 8 MM MISC USE 1 PENTIP DAILY WITH INSULIN  . Insulin Glargine (BASAGLAR KWIKPEN) 100 UNIT/ML 40 Units at bedtime. endo  . Insulin Glargine (BASAGLAR KWIKPEN) 100 UNIT/ML Inject into the skin.  Marland Kitchen JARDIANCE 25 MG TABS tablet Take 25 mg by mouth daily.  Marland Kitchen lisinopril (ZESTRIL) 5 MG tablet TAKE (1) TABLET BY MOUTH EVERY DAY  . metFORMIN (GLUCOPHAGE) 500 MG tablet TAKE (1) TABLET BY MOUTH TWICE DAILY (Patient taking differently: 500 in am 500 in pm)  . pantoprazole (PROTONIX) 40 MG tablet TAKE 1 TABLET BY MOUTH TWICE DAILY BEFORE A MEAL  . primidone (MYSOLINE) 250 MG tablet Take 500 mg by mouth at bedtime. 2 tablets daily- neuro  . tamsulosin (FLOMAX) 0.4 MG CAPS capsule TAKE (2) CAPSULES BY MOUTH EVERY DAY       03/01/2023    9:30 AM 10/04/2022    9:47 AM 08/23/2022    9:37 AM 04/24/2022    9:13 AM  GAD 7 : Generalized Anxiety Score  Nervous, Anxious, on Edge 0 0 0 0  Control/stop worrying 0 0 0 0  Worry too much - different things 0 0 0 0  Trouble relaxing 0 0 0 0  Restless 0 0 0 0  Easily  annoyed or irritable 0 0 0 0  Afraid - awful might happen 0 0 0 0  Total GAD 7 Score 0 0 0 0  Anxiety Difficulty Not difficult at all Not difficult at all Not difficult at all Not difficult at all       03/01/2023    9:29 AM 10/04/2022    9:47 AM 08/23/2022    9:37 AM  Depression screen PHQ 2/9  Decreased Interest 0 0 0  Down, Depressed, Hopeless 0 0 0  PHQ - 2 Score 0 0 0  Altered sleeping 0 0 0  Tired, decreased energy 0 0 0  Change in appetite 0 0 0  Feeling bad or failure about yourself  0 0 0  Trouble concentrating 0 0 0  Moving slowly or fidgety/restless 0 0 0  Suicidal thoughts 0 0 0  PHQ-9 Score 0 0 0  Difficult doing work/chores Not  difficult at all Not difficult at all Not difficult at all    BP Readings from Last 3 Encounters:  03/01/23 118/68  01/12/23 122/78  10/04/22 128/76    Physical Exam Vitals and nursing note reviewed.  HENT:     Head: Normocephalic.     Right Ear: Tympanic membrane and external ear normal.     Left Ear: Tympanic membrane and external ear normal.     Nose: Nose normal.     Mouth/Throat:     Mouth: Mucous membranes are moist.  Eyes:     General: No scleral icterus.       Right eye: No discharge.        Left eye: No discharge.     Conjunctiva/sclera: Conjunctivae normal.     Pupils: Pupils are equal, round, and reactive to light.  Neck:     Thyroid: No thyromegaly.     Vascular: No JVD.     Trachea: No tracheal deviation.  Cardiovascular:     Rate and Rhythm: Normal rate and regular rhythm.     Heart sounds: Normal heart sounds. No murmur heard.    No friction rub. No gallop.  Pulmonary:     Effort: No respiratory distress.     Breath sounds: Normal breath sounds. No wheezing, rhonchi or rales.  Chest:     Chest wall: No tenderness.  Abdominal:     General: Bowel sounds are normal.     Palpations: Abdomen is soft. There is no mass.     Tenderness: There is no abdominal tenderness. There is no guarding or rebound.   Genitourinary:    Prostate: Normal. Not enlarged, not tender and no nodules present.     Rectum: Normal. Guaiac result negative. No mass.  Musculoskeletal:        General: No tenderness. Normal range of motion.     Cervical back: Normal range of motion and neck supple.  Lymphadenopathy:     Cervical: No cervical adenopathy.  Skin:    General: Skin is warm.     Findings: No bruising or rash.  Neurological:     Mental Status: He is alert.    Wt Readings from Last 3 Encounters:  03/01/23 183 lb (83 kg)  01/12/23 180 lb (81.6 kg)  10/04/22 185 lb (83.9 kg)    BP 118/68   Pulse 80   Ht 5\' 6"  (1.676 m)   Wt 183 lb (83 kg)   SpO2 98%   BMI 29.54 kg/m   Assessment and Plan: 1. Essential hypertension Chronic.  Controlled.  Stable.  Blood pressure today 118/68.  Asymptomatic.  Tolerating medication well.  Continue lisinopril 5 mg once a day.  Will recheck patient in 6 months - lisinopril (ZESTRIL) 5 MG tablet; TAKE (1) TABLET BY MOUTH EVERY DAY  Dispense: 90 tablet; Refill: 1  2. Gastroesophageal reflux disease Chronic.  Asymptomatic.  Without dysphagia.  Continue pantoprazole 40 mg twice a day.  Will recheck in 6 months - pantoprazole (PROTONIX) 40 MG tablet; Take 1 tablet (40 mg total) by mouth 2 (two) times daily before a meal.  Dispense: 180 tablet; Refill: 1  3. Benign prostatic hyperplasia with urinary frequency Chronic.  Controlled.  Stable.  Primarily asymptomatic except for nocturia x 1.  Continue tamsulosin 0.4 mg 2 tablets every day and exam was noted to have normal size shape and consistency of prostate. - tamsulosin (FLOMAX) 0.4 MG CAPS capsule; TAKE (2) CAPSULES BY MOUTH EVERY DAY  Dispense: 180 capsule; Refill:  1 - PSA     Elizabeth Sauer, MD

## 2023-03-02 LAB — PSA: Prostate Specific Ag, Serum: 0.2 ng/mL (ref 0.0–4.0)

## 2023-03-04 ENCOUNTER — Encounter: Payer: Self-pay | Admitting: Family Medicine

## 2023-04-09 ENCOUNTER — Telehealth: Payer: Self-pay

## 2023-04-09 NOTE — Telephone Encounter (Signed)
Your PA request has been approved. Additional information will be provided in the approval communication. (Message 1145)  KP

## 2023-04-09 NOTE — Telephone Encounter (Signed)
PA completed waiting on insurance approval.  Key: [id number]  [id number]

## 2023-05-14 ENCOUNTER — Other Ambulatory Visit: Payer: Self-pay | Admitting: Family Medicine

## 2023-05-14 DIAGNOSIS — K219 Gastro-esophageal reflux disease without esophagitis: Secondary | ICD-10-CM

## 2023-05-15 DIAGNOSIS — G250 Essential tremor: Secondary | ICD-10-CM | POA: Diagnosis not present

## 2023-05-15 DIAGNOSIS — R259 Unspecified abnormal involuntary movements: Secondary | ICD-10-CM | POA: Diagnosis not present

## 2023-07-10 DIAGNOSIS — Z794 Long term (current) use of insulin: Secondary | ICD-10-CM | POA: Diagnosis not present

## 2023-07-10 DIAGNOSIS — E119 Type 2 diabetes mellitus without complications: Secondary | ICD-10-CM | POA: Diagnosis not present

## 2023-07-10 LAB — BASIC METABOLIC PANEL
BUN: 17 (ref 4–21)
CO2: 27 — AB (ref 13–22)
Chloride: 100 (ref 99–108)
Creatinine: 0.9 (ref 0.6–1.3)
Glucose: 214
Potassium: 4 meq/L (ref 3.5–5.1)
Sodium: 136 — AB (ref 137–147)

## 2023-07-10 LAB — HEMOGLOBIN A1C: Hemoglobin A1C: 6.1

## 2023-07-10 LAB — LIPID PANEL
Cholesterol: 116 (ref 0–200)
HDL: 39 (ref 35–70)
LDL Cholesterol: 43
Triglycerides: 166 — AB (ref 40–160)

## 2023-07-10 LAB — MICROALBUMIN / CREATININE URINE RATIO: Microalb Creat Ratio: 14.7

## 2023-07-10 LAB — PROTEIN / CREATININE RATIO, URINE: Albumin, U: 15

## 2023-07-10 LAB — COMPREHENSIVE METABOLIC PANEL: eGFR: 100

## 2023-07-10 LAB — MICROALBUMIN, URINE: Microalb, Ur: 102.1

## 2023-07-15 DIAGNOSIS — Z20822 Contact with and (suspected) exposure to covid-19: Secondary | ICD-10-CM | POA: Diagnosis not present

## 2023-07-15 DIAGNOSIS — R059 Cough, unspecified: Secondary | ICD-10-CM | POA: Diagnosis not present

## 2023-07-15 DIAGNOSIS — U071 COVID-19: Secondary | ICD-10-CM | POA: Diagnosis not present

## 2023-08-14 ENCOUNTER — Encounter: Payer: Self-pay | Admitting: Family Medicine

## 2023-08-14 ENCOUNTER — Ambulatory Visit: Payer: 59 | Admitting: Family Medicine

## 2023-08-14 VITALS — BP 124/74 | HR 76 | Ht 66.0 in | Wt 180.6 lb

## 2023-08-14 DIAGNOSIS — E782 Mixed hyperlipidemia: Secondary | ICD-10-CM | POA: Diagnosis not present

## 2023-08-14 DIAGNOSIS — N401 Enlarged prostate with lower urinary tract symptoms: Secondary | ICD-10-CM

## 2023-08-14 DIAGNOSIS — E119 Type 2 diabetes mellitus without complications: Secondary | ICD-10-CM | POA: Diagnosis not present

## 2023-08-14 DIAGNOSIS — K219 Gastro-esophageal reflux disease without esophagitis: Secondary | ICD-10-CM

## 2023-08-14 DIAGNOSIS — Z7984 Long term (current) use of oral hypoglycemic drugs: Secondary | ICD-10-CM

## 2023-08-14 DIAGNOSIS — R350 Frequency of micturition: Secondary | ICD-10-CM | POA: Diagnosis not present

## 2023-08-14 DIAGNOSIS — I10 Essential (primary) hypertension: Secondary | ICD-10-CM

## 2023-08-14 MED ORDER — TAMSULOSIN HCL 0.4 MG PO CAPS: ORAL_CAPSULE | ORAL | 1 refills | Status: AC

## 2023-08-14 MED ORDER — ATORVASTATIN CALCIUM 10 MG PO TABS: 10.0000 mg | ORAL_TABLET | Freq: Every day | ORAL | 1 refills | Status: AC

## 2023-08-14 MED ORDER — LISINOPRIL 5 MG PO TABS: ORAL_TABLET | ORAL | 1 refills | Status: AC

## 2023-08-14 MED ORDER — PANTOPRAZOLE SODIUM 40 MG PO TBEC: 40.0000 mg | DELAYED_RELEASE_TABLET | Freq: Two times a day (BID) | ORAL | 0 refills | Status: AC

## 2023-08-14 MED ORDER — METFORMIN HCL 500 MG PO TABS: 500.0000 mg | ORAL_TABLET | Freq: Two times a day (BID) | ORAL | 1 refills | Status: AC

## 2023-08-14 NOTE — Progress Notes (Signed)
 Date:  08/14/2023   Name:  Kevin Montgomery   DOB:  [birth year]   MRN:  [id number]   Chief Complaint: Hypertension, Diabetes, Gastroesophageal Reflux, Benign Prostatic Hypertrophy, and Hyperlipidemia  Hypertension This is a chronic problem. The current episode started more than 1 year ago. The problem has been gradually improving since onset. The problem is controlled. Pertinent negatives include no anxiety, blurred vision, chest pain, headaches, malaise/fatigue, neck pain, orthopnea, palpitations, peripheral edema, PND, shortness of breath or sweats. There are no associated agents to hypertension. Risk factors for coronary artery disease include dyslipidemia and diabetes mellitus. Past treatments include ACE inhibitors. The current treatment provides moderate improvement. There are no compliance problems.  There is no history of angina, CAD/MI or CVA. There is no history of chronic renal disease, a hypertension causing med or renovascular disease.  Diabetes He presents for his follow-up diabetic visit. He has type 2 diabetes mellitus. His disease course has been stable. Pertinent negatives for hypoglycemia include no dizziness, headaches, mood changes, seizures or sweats. Pertinent negatives for diabetes include no blurred vision, no chest pain, no fatigue, no foot paresthesias, no foot ulcerations, no polydipsia, no polyphagia, no polyuria, no visual change, no weakness and no weight loss. There are no hypoglycemic complications. Symptoms are stable. There are no diabetic complications. Pertinent negatives for diabetic complications include no CVA. Risk factors for coronary artery disease include dyslipidemia and hypertension. Current diabetic treatment includes oral agent (monotherapy). He is compliant with treatment most of the time. He is following a generally healthy diet. Meal planning includes avoidance of concentrated sweets and carbohydrate counting. He participates in exercise intermittently. An  ACE inhibitor/angiotensin II receptor blocker is being taken.  Gastroesophageal Reflux He reports no belching, no chest pain, no coughing, no dysphagia, no heartburn or no wheezing. This is a chronic problem. The problem occurs rarely. The symptoms are aggravated by certain foods. Pertinent negatives include no anemia, fatigue, melena, muscle weakness, orthopnea or weight loss. He has tried a PPI for the symptoms. The treatment provided moderate relief.  Benign Prostatic Hypertrophy This is a chronic problem. The current episode started more than 1 year ago. The problem has been waxing and waning since onset. Irritative symptoms include nocturia. Irritative symptoms do not include frequency or urgency. x1. Obstructive symptoms do not include dribbling. Pertinent negatives include no dysuria or hematuria. Past treatments include tamsulosin. The treatment provided moderate relief.  Hyperlipidemia This is a chronic problem. The current episode started more than 1 year ago. The problem is controlled. Recent lipid tests were reviewed and are normal. He has no history of chronic renal disease. Pertinent negatives include no chest pain, focal sensory loss, focal weakness, leg pain, myalgias or shortness of breath. Current antihyperlipidemic treatment includes statins. The current treatment provides moderate improvement of lipids. There are no compliance problems.  Risk factors for coronary artery disease include dyslipidemia and hypertension.    Lab Results  Component Value Date   NA 136 (A) 07/10/2023   K 4.0 07/10/2023   CO2 27 (A) 07/10/2023   GLUCOSE 269 (H) 04/24/2022   BUN 17 07/10/2023   CREATININE 0.9 07/10/2023   CALCIUM 9.5 04/24/2022   EGFR 100 07/10/2023   GFRNONAA 105 08/03/2020   Lab Results  Component Value Date   CHOL 116 07/10/2023   HDL 39 07/10/2023   LDLCALC 43 07/10/2023   TRIG 166 (A) 07/10/2023   CHOLHDL 4.1 01/27/2020   No results found for: "TSH" Lab Results  Component Value Date   HGBA1C 6.1 07/10/2023   Lab Results  Component Value Date   WBC 5.7 09/21/2020   HGB 15.5 09/21/2020   HCT 42.0 09/21/2020   MCV 89.2 09/21/2020   PLT 158 09/21/2020   Lab Results  Component Value Date   ALT 37 04/24/2022   AST 18 04/24/2022   ALKPHOS 54 04/24/2022   BILITOT 0.6 04/24/2022   No results found for: "25OHVITD2", "25OHVITD3", "VD25OH"   Review of Systems  Constitutional:  Negative for fatigue, malaise/fatigue, unexpected weight change and weight loss.  HENT:  Negative for congestion and trouble swallowing.   Eyes:  Negative for blurred vision and visual disturbance.  Respiratory:  Negative for cough, chest tightness, shortness of breath, wheezing and stridor.   Cardiovascular:  Negative for chest pain, palpitations, orthopnea and PND.  Gastrointestinal:  Negative for abdominal distention, constipation, diarrhea, dysphagia, heartburn and melena.  Endocrine: Negative for polydipsia, polyphagia and polyuria.  Genitourinary:  Positive for nocturia. Negative for dysuria, frequency, hematuria and urgency.  Musculoskeletal:  Negative for myalgias, muscle weakness and neck pain.  Neurological:  Negative for dizziness, focal weakness, seizures, weakness and headaches.    Patient Active Problem List   Diagnosis Date Noted   Influenza 05/11/2022   Encounter for screening colonoscopy    Gastroesophageal reflux disease 12/05/2017   Incarcerated umbilical hernia    Umbilical hernia 10/08/2015   Diastasis recti 10/08/2015   Essential hypertension 04/21/2015   Hyperlipidemia 04/21/2015    No Known Allergies  Past Surgical History:  Procedure Laterality Date   COLONOSCOPY WITH PROPOFOL N/A 12/16/2018   Procedure: COLONOSCOPY WITH PROPOFOL;  Surgeon: Midge Minium, MD;  Location: [facility];  Service: Endoscopy;  Laterality: N/A;  Diabetic - insulin and oral meds   ESOPHAGOGASTRODUODENOSCOPY (EGD) WITH PROPOFOL N/A 07/11/2021   Procedure:  ESOPHAGOGASTRODUODENOSCOPY (EGD) WITH PROPOFOL;  Surgeon: Midge Minium, MD;  Location: [facility];  Service: Endoscopy;  Laterality: N/A;  Diabetic   KIDNEY STONE SURGERY     LITHOTRIPSY     ROTATOR CUFF REPAIR Right 03/23/2020   [facility]   UMBILICAL HERNIA REPAIR N/A 12/20/2015   Procedure: HERNIA REPAIR UMBILICAL ADULT;  Surgeon: Gladis Riffle, MD;  Location: [facility];  Service: General;  Laterality: N/A;    Social History   Tobacco Use   Smoking status: Never   Smokeless tobacco: Never  Vaping Use   Vaping status: Never Used  Substance Use Topics   Alcohol use: Yes    Alcohol/week: 0.0 standard drinks of alcohol    Comment: rare   Drug use: No     Medication list has been reviewed and updated.  Current Meds  Medication Sig   atorvastatin (LIPITOR) 10 MG tablet Take 10 mg by mouth daily.   B-D UF III MINI PEN NEEDLES 31G X 5 MM MISC EVERY DAY WITH INSULIN   GLOBAL EASE INJECT PEN NEEDLES 31G X 8 MM MISC USE 1 PENTIP DAILY WITH INSULIN   Insulin Glargine (BASAGLAR KWIKPEN) 100 UNIT/ML 40 Units at bedtime. endo   Insulin Glargine (BASAGLAR KWIKPEN) 100 UNIT/ML Inject into the skin.   JARDIANCE 25 MG TABS tablet Take 25 mg by mouth daily.   lisinopril (ZESTRIL) 5 MG tablet TAKE (1) TABLET BY MOUTH EVERY DAY   metFORMIN (GLUCOPHAGE) 500 MG tablet TAKE (1) TABLET BY MOUTH TWICE DAILY (Patient taking differently: 500 in am 500 in pm)   pantoprazole (PROTONIX) 40 MG tablet TAKE ONE (1) TABLET BY MOUTH TWICE DAILY  BEFORE A MEAL   primidone (MYSOLINE) 250 MG tablet Take 500 mg by mouth at bedtime. 2 tablets daily- neuro   tamsulosin (FLOMAX) 0.4 MG CAPS capsule TAKE (2) CAPSULES BY MOUTH EVERY DAY       08/14/2023    8:36 AM 03/01/2023    9:30 AM 10/04/2022    9:47 AM 08/23/2022    9:37 AM  GAD 7 : Generalized Anxiety Score  Nervous, Anxious, on Edge 0 0 0 0  Control/stop worrying 0 0 0 0  Worry too much - different things 0 0 0 0  Trouble relaxing 0 0 0 0  Restless  0 0 0 0  Easily annoyed or irritable 0 0 0 0  Afraid - awful might happen 0 0 0 0  Total GAD 7 Score 0 0 0 0  Anxiety Difficulty Not difficult at all Not difficult at all Not difficult at all Not difficult at all       08/14/2023    8:36 AM 03/01/2023    9:29 AM 10/04/2022    9:47 AM  Depression screen PHQ 2/9  Decreased Interest 0 0 0  Down, Depressed, Hopeless 0 0 0  PHQ - 2 Score 0 0 0  Altered sleeping 3 0 0  Tired, decreased energy 0 0 0  Change in appetite 0 0 0  Feeling bad or failure about yourself  0 0 0  Trouble concentrating 0 0 0  Moving slowly or fidgety/restless 0 0 0  Suicidal thoughts 0 0 0  PHQ-9 Score 3 0 0  Difficult doing work/chores Not difficult at all Not difficult at all Not difficult at all    BP Readings from Last 3 Encounters:  08/14/23 124/74  03/01/23 118/68  01/12/23 122/78    Physical Exam Vitals and nursing note reviewed.  Constitutional:      Appearance: He is well-developed.  HENT:     Head: Normocephalic and atraumatic.     Right Ear: Tympanic membrane, ear canal and external ear normal.     Left Ear: Tympanic membrane, ear canal and external ear normal.     Nose: Nose normal.     Mouth/Throat:     Mouth: Mucous membranes are moist.     Dentition: Normal dentition.  Eyes:     General: Lids are normal. No scleral icterus.    Conjunctiva/sclera: Conjunctivae normal.     Pupils: Pupils are equal, round, and reactive to light.  Neck:     Thyroid: No thyromegaly.     Vascular: No carotid bruit, hepatojugular reflux or JVD.     Trachea: No tracheal deviation.  Cardiovascular:     Rate and Rhythm: Normal rate and regular rhythm.     Heart sounds: Normal heart sounds.  Pulmonary:     Effort: Pulmonary effort is normal.     Breath sounds: Normal breath sounds. No wheezing, rhonchi or rales.  Abdominal:     General: Bowel sounds are normal.     Palpations: Abdomen is soft. There is no hepatomegaly, splenomegaly or mass.      Tenderness: There is no abdominal tenderness.     Hernia: There is no hernia in the left inguinal area.  Musculoskeletal:        General: Normal range of motion.     Cervical back: Normal range of motion and neck supple.  Lymphadenopathy:     Cervical: No cervical adenopathy.  Skin:    General: Skin is warm and dry.  Findings: No rash.  Neurological:     Mental Status: He is alert and oriented to person, place, and time.     Sensory: No sensory deficit.     Deep Tendon Reflexes: Reflexes are normal and symmetric.  Psychiatric:        Mood and Affect: Mood is not anxious or depressed.     Wt Readings from Last 3 Encounters:  08/14/23 180 lb 9.6 oz (81.9 kg)  03/01/23 183 lb (83 kg)  01/12/23 180 lb (81.6 kg)    BP 124/74   Pulse 76   Ht 5\' 6"  (1.676 m)   Wt 180 lb 9.6 oz (81.9 kg)   SpO2 97%   BMI 29.15 kg/m   Assessment and Plan: 1. Essential hypertension (Primary) Chronic.  Controlled.  Stable.  Asymptomatic.  Tolerating current medications well.  Blood pressure today is 124/74.  Patient is asymptomatic and we will continue lisinopril 5 mg once a day.  Will recheck patient in 6 months. - lisinopril (ZESTRIL) 5 MG tablet; TAKE (1) TABLET BY MOUTH EVERY DAY  Dispense: 90 tablet; Refill: 1  2. Type 2 diabetes mellitus without complication, without long-term current use of insulin (HCC) Chronic.  Controlled.  Stable.  Asymptomatic.  Tolerating medications well.  Continue metformin 500 mg twice a day.  Patient is followed by Dr. Solum/endocrinology and is doing well on current medication regimen. - metFORMIN (GLUCOPHAGE) 500 MG tablet; Take 1 tablet (500 mg total) by mouth 2 (two) times daily with a meal. TAKE (1) TABLET BY MOUTH TWICE DAILY  Dispense: 180 tablet; Refill: 1  3. Gastroesophageal reflux disease Chronic.  Controlled.  Stable.  Patient is doing excellent with pantoprazole 40 mg twice a day we will continue at current dosing and he is tolerating this well as long  as we are able to continue to get it at this dosing regimen. - pantoprazole (PROTONIX) 40 MG tablet; Take 1 tablet (40 mg total) by mouth 2 (two) times daily before a meal. TAKE ONE (1) TABLET BY MOUTH TWICE DAILY BEFORE A MEAL  Dispense: 180 tablet; Refill: 0  4. Benign prostatic hyperplasia with urinary frequency Chronic.  Controlled.  Stable.  Asymptomatic.  Minimal symptomatology with some nocturia.  Previous DRE was normal.  Continue tamsulosin 0.4 more milligrams 2 tablets every day.  Will recheck in 6 months. - tamsulosin (FLOMAX) 0.4 MG CAPS capsule; TAKE (2) CAPSULES BY MOUTH EVERY DAY  Dispense: 180 capsule; Refill: 1  5. Mixed hyperlipidemia Chronic.  Controlled.  Stable.  Asymptomatic.  Without myalgias without muscle weakness.  Continue atorvastatin 10 mg once a day.  Will recheck in 6 months with lipid panel at that time. - atorvastatin (LIPITOR) 10 MG tablet; Take 1 tablet (10 mg total) by mouth daily.  Dispense: 90 tablet; Refill: 1     Elizabeth Sauer, MD

## 2023-08-20 ENCOUNTER — Other Ambulatory Visit: Payer: Self-pay | Admitting: Family Medicine

## 2023-08-20 DIAGNOSIS — E119 Type 2 diabetes mellitus without complications: Secondary | ICD-10-CM

## 2023-09-25 DIAGNOSIS — Z794 Long term (current) use of insulin: Secondary | ICD-10-CM | POA: Diagnosis not present

## 2023-09-25 DIAGNOSIS — E119 Type 2 diabetes mellitus without complications: Secondary | ICD-10-CM | POA: Diagnosis not present

## 2023-11-01 DIAGNOSIS — M545 Low back pain, unspecified: Secondary | ICD-10-CM | POA: Diagnosis not present

## 2023-11-01 DIAGNOSIS — E119 Type 2 diabetes mellitus without complications: Secondary | ICD-10-CM | POA: Diagnosis not present

## 2023-11-01 DIAGNOSIS — S90851A Superficial foreign body, right foot, initial encounter: Secondary | ICD-10-CM | POA: Diagnosis not present

## 2023-11-14 DIAGNOSIS — R259 Unspecified abnormal involuntary movements: Secondary | ICD-10-CM | POA: Diagnosis not present

## 2023-11-14 DIAGNOSIS — G250 Essential tremor: Secondary | ICD-10-CM | POA: Diagnosis not present

## 2024-01-22 DIAGNOSIS — E119 Type 2 diabetes mellitus without complications: Secondary | ICD-10-CM | POA: Diagnosis not present

## 2024-01-22 DIAGNOSIS — Z794 Long term (current) use of insulin: Secondary | ICD-10-CM | POA: Diagnosis not present

## 2024-01-29 DIAGNOSIS — E119 Type 2 diabetes mellitus without complications: Secondary | ICD-10-CM | POA: Diagnosis not present

## 2024-01-29 DIAGNOSIS — Z794 Long term (current) use of insulin: Secondary | ICD-10-CM | POA: Diagnosis not present

## 2024-01-29 DIAGNOSIS — Z1331 Encounter for screening for depression: Secondary | ICD-10-CM | POA: Diagnosis not present

## 2024-03-20 DIAGNOSIS — Z1331 Encounter for screening for depression: Secondary | ICD-10-CM | POA: Diagnosis not present

## 2024-03-20 DIAGNOSIS — E785 Hyperlipidemia, unspecified: Secondary | ICD-10-CM | POA: Diagnosis not present

## 2024-03-20 DIAGNOSIS — R0683 Snoring: Secondary | ICD-10-CM | POA: Diagnosis not present

## 2024-03-20 DIAGNOSIS — R251 Tremor, unspecified: Secondary | ICD-10-CM | POA: Diagnosis not present

## 2024-03-20 DIAGNOSIS — I10 Essential (primary) hypertension: Secondary | ICD-10-CM | POA: Diagnosis not present

## 2024-03-20 DIAGNOSIS — E119 Type 2 diabetes mellitus without complications: Secondary | ICD-10-CM | POA: Diagnosis not present
